# Patient Record
Sex: Female | Born: 1945
Health system: Southern US, Community
[De-identification: ages and names within clinical notes are randomized; demographics above are authoritative.]

## PROBLEM LIST (undated history)

## (undated) DIAGNOSIS — M5136 Other intervertebral disc degeneration, lumbar region: Secondary | ICD-10-CM

## (undated) DIAGNOSIS — E785 Hyperlipidemia, unspecified: Secondary | ICD-10-CM

## (undated) DIAGNOSIS — Z0000 Encounter for general adult medical examination without abnormal findings: Principal | ICD-10-CM

## (undated) DIAGNOSIS — R51 Headache: Secondary | ICD-10-CM

## (undated) DIAGNOSIS — T7840XA Allergy, unspecified, initial encounter: Secondary | ICD-10-CM

## (undated) DIAGNOSIS — J45909 Unspecified asthma, uncomplicated: Secondary | ICD-10-CM

## (undated) DIAGNOSIS — M1990 Unspecified osteoarthritis, unspecified site: Secondary | ICD-10-CM

## (undated) DIAGNOSIS — D649 Anemia, unspecified: Secondary | ICD-10-CM

## (undated) DIAGNOSIS — E669 Obesity, unspecified: Secondary | ICD-10-CM

## (undated) DIAGNOSIS — K219 Gastro-esophageal reflux disease without esophagitis: Secondary | ICD-10-CM

## (undated) DIAGNOSIS — H60399 Other infective otitis externa, unspecified ear: Secondary | ICD-10-CM

## (undated) DIAGNOSIS — O039 Complete or unspecified spontaneous abortion without complication: Secondary | ICD-10-CM

## (undated) DIAGNOSIS — F32A Depression, unspecified: Secondary | ICD-10-CM

## (undated) DIAGNOSIS — Z860100 Personal history of colon polyps, unspecified: Secondary | ICD-10-CM

## (undated) DIAGNOSIS — F329 Major depressive disorder, single episode, unspecified: Secondary | ICD-10-CM

## (undated) DIAGNOSIS — M797 Fibromyalgia: Secondary | ICD-10-CM

## (undated) DIAGNOSIS — G2581 Restless legs syndrome: Secondary | ICD-10-CM

## (undated) DIAGNOSIS — F418 Other specified anxiety disorders: Secondary | ICD-10-CM

## (undated) DIAGNOSIS — Z8719 Personal history of other diseases of the digestive system: Secondary | ICD-10-CM

## (undated) DIAGNOSIS — G3184 Mild cognitive impairment, so stated: Secondary | ICD-10-CM

## (undated) DIAGNOSIS — E079 Disorder of thyroid, unspecified: Secondary | ICD-10-CM

## (undated) DIAGNOSIS — L659 Nonscarring hair loss, unspecified: Secondary | ICD-10-CM

## (undated) DIAGNOSIS — R739 Hyperglycemia, unspecified: Secondary | ICD-10-CM

## (undated) HISTORY — PX: TONSILLECTOMY: SHX5217

## (undated) HISTORY — DX: Hyperglycemia, unspecified: R73.9

## (undated) HISTORY — DX: Major depressive disorder, single episode, unspecified: F32.9

## (undated) HISTORY — DX: Personal history of other diseases of the digestive system: Z87.19

## (undated) HISTORY — DX: Depression, unspecified: F32.A

## (undated) HISTORY — DX: Fibromyalgia: M79.7

## (undated) HISTORY — DX: Obesity, unspecified: E66.9

## (undated) HISTORY — DX: Headache: R51

## (undated) HISTORY — DX: Personal history of colonic polyps: Z86.010

## (undated) HISTORY — DX: Unspecified osteoarthritis, unspecified site: M19.90

## (undated) HISTORY — DX: Complete or unspecified spontaneous abortion without complication: O03.9

## (undated) HISTORY — DX: Gastro-esophageal reflux disease without esophagitis: K21.9

## (undated) HISTORY — PX: CHOLECYSTECTOMY: SHX55

## (undated) HISTORY — PX: ABDOMINAL HYSTERECTOMY: SHX81

## (undated) HISTORY — DX: Disorder of thyroid, unspecified: E07.9

## (undated) HISTORY — DX: Anemia, unspecified: D64.9

## (undated) HISTORY — DX: Other infective otitis externa, unspecified ear: H60.399

## (undated) HISTORY — DX: Other intervertebral disc degeneration, lumbar region: M51.36

## (undated) HISTORY — DX: Other specified anxiety disorders: F41.8

## (undated) HISTORY — DX: Nonscarring hair loss, unspecified: L65.9

## (undated) HISTORY — DX: Mild cognitive impairment, so stated: G31.84

## (undated) HISTORY — PX: BLADDER SUSPENSION: SHX72

## (undated) HISTORY — DX: Restless legs syndrome: G25.81

## (undated) HISTORY — DX: Allergy, unspecified, initial encounter: T78.40XA

## (undated) HISTORY — DX: Personal history of colon polyps, unspecified: Z86.0100

## (undated) HISTORY — DX: Hyperlipidemia, unspecified: E78.5

## (undated) HISTORY — DX: Encounter for general adult medical examination without abnormal findings: Z00.00

---

## 2004-02-05 LAB — CONVERTED CEMR LAB: Pap Smear: NORMAL

## 2009-01-19 ENCOUNTER — Ambulatory Visit: Payer: Self-pay | Admitting: *Deleted

## 2009-01-19 DIAGNOSIS — R51 Headache: Secondary | ICD-10-CM

## 2009-01-19 DIAGNOSIS — Z860100 Personal history of colon polyps, unspecified: Secondary | ICD-10-CM | POA: Insufficient documentation

## 2009-01-19 DIAGNOSIS — R5383 Other fatigue: Secondary | ICD-10-CM

## 2009-01-19 DIAGNOSIS — G894 Chronic pain syndrome: Secondary | ICD-10-CM | POA: Insufficient documentation

## 2009-01-19 DIAGNOSIS — M1990 Unspecified osteoarthritis, unspecified site: Secondary | ICD-10-CM | POA: Insufficient documentation

## 2009-01-19 DIAGNOSIS — R32 Unspecified urinary incontinence: Secondary | ICD-10-CM | POA: Insufficient documentation

## 2009-01-19 DIAGNOSIS — K219 Gastro-esophageal reflux disease without esophagitis: Secondary | ICD-10-CM | POA: Insufficient documentation

## 2009-01-19 DIAGNOSIS — R519 Headache, unspecified: Secondary | ICD-10-CM | POA: Insufficient documentation

## 2009-01-19 DIAGNOSIS — F329 Major depressive disorder, single episode, unspecified: Secondary | ICD-10-CM

## 2009-01-19 DIAGNOSIS — J069 Acute upper respiratory infection, unspecified: Secondary | ICD-10-CM | POA: Insufficient documentation

## 2009-01-19 DIAGNOSIS — F418 Other specified anxiety disorders: Secondary | ICD-10-CM

## 2009-01-19 DIAGNOSIS — IMO0001 Reserved for inherently not codable concepts without codable children: Secondary | ICD-10-CM

## 2009-01-19 DIAGNOSIS — Z8719 Personal history of other diseases of the digestive system: Secondary | ICD-10-CM

## 2009-01-19 DIAGNOSIS — D649 Anemia, unspecified: Secondary | ICD-10-CM

## 2009-01-19 DIAGNOSIS — E039 Hypothyroidism, unspecified: Secondary | ICD-10-CM | POA: Insufficient documentation

## 2009-01-19 DIAGNOSIS — J309 Allergic rhinitis, unspecified: Secondary | ICD-10-CM

## 2009-01-19 DIAGNOSIS — G43009 Migraine without aura, not intractable, without status migrainosus: Secondary | ICD-10-CM | POA: Insufficient documentation

## 2009-01-19 DIAGNOSIS — E785 Hyperlipidemia, unspecified: Secondary | ICD-10-CM | POA: Insufficient documentation

## 2009-01-19 DIAGNOSIS — F411 Generalized anxiety disorder: Secondary | ICD-10-CM | POA: Insufficient documentation

## 2009-01-19 HISTORY — DX: Other specified anxiety disorders: F41.8

## 2009-01-20 ENCOUNTER — Telehealth (INDEPENDENT_AMBULATORY_CARE_PROVIDER_SITE_OTHER): Payer: Self-pay | Admitting: *Deleted

## 2009-01-21 LAB — CONVERTED CEMR LAB
Albumin: 3.6 g/dL (ref 3.5–5.2)
BUN: 13 mg/dL (ref 6–23)
CO2: 27 meq/L (ref 19–32)
Calcium: 9.2 mg/dL (ref 8.4–10.5)
Chloride: 105 meq/L (ref 96–112)
Eosinophils Relative: 1.7 % (ref 0.0–5.0)
GFR calc Af Amer: 109 mL/min
GFR calc non Af Amer: 90 mL/min
Glucose, Bld: 105 mg/dL — ABNORMAL HIGH (ref 70–99)
HCT: 46 % (ref 36.0–46.0)
Lymphocytes Relative: 25 % (ref 12.0–46.0)
Monocytes Relative: 7.4 % (ref 3.0–12.0)
Platelets: 386 10*3/uL (ref 150–400)
Potassium: 4 meq/L (ref 3.5–5.1)
RDW: 12.3 % (ref 11.5–14.6)
Sodium: 142 meq/L (ref 135–145)
TSH: 0.85 microintl units/mL (ref 0.35–5.50)
Total Protein: 7.7 g/dL (ref 6.0–8.3)
WBC: 12 10*3/uL — ABNORMAL HIGH (ref 4.5–10.5)

## 2009-09-01 ENCOUNTER — Ambulatory Visit (HOSPITAL_BASED_OUTPATIENT_CLINIC_OR_DEPARTMENT_OTHER): Admission: RE | Admit: 2009-09-01 | Discharge: 2009-09-01 | Payer: Self-pay | Admitting: Internal Medicine

## 2009-09-01 ENCOUNTER — Ambulatory Visit: Payer: Self-pay | Admitting: Diagnostic Radiology

## 2009-09-01 ENCOUNTER — Ambulatory Visit: Payer: Self-pay | Admitting: Family

## 2009-09-01 DIAGNOSIS — B373 Candidiasis of vulva and vagina: Secondary | ICD-10-CM

## 2009-09-08 ENCOUNTER — Encounter: Payer: Self-pay | Admitting: Family

## 2009-09-09 ENCOUNTER — Encounter: Admission: RE | Admit: 2009-09-09 | Discharge: 2009-09-09 | Payer: Self-pay | Admitting: Internal Medicine

## 2009-09-10 ENCOUNTER — Encounter: Payer: Self-pay | Admitting: Family

## 2009-09-10 LAB — CONVERTED CEMR LAB
BUN: 12 mg/dL (ref 6–23)
Basophils Absolute: 0.1 10*3/uL (ref 0.0–0.1)
Basophils Relative: 1 % (ref 0–1)
Calcium: 9.3 mg/dL (ref 8.4–10.5)
Glucose, Bld: 89 mg/dL (ref 70–99)
Hemoglobin: 13.9 g/dL (ref 12.0–15.0)
MCHC: 32.4 g/dL (ref 30.0–36.0)
Monocytes Absolute: 0.7 10*3/uL (ref 0.1–1.0)
Neutro Abs: 4.3 10*3/uL (ref 1.7–7.7)
Platelets: 347 10*3/uL (ref 150–400)
RDW: 13.2 % (ref 11.5–15.5)
Sodium: 139 meq/L (ref 135–145)
TSH: 0.337 microintl units/mL — ABNORMAL LOW (ref 0.350–4.500)

## 2009-09-11 ENCOUNTER — Encounter: Payer: Self-pay | Admitting: Family

## 2010-02-08 ENCOUNTER — Ambulatory Visit (HOSPITAL_BASED_OUTPATIENT_CLINIC_OR_DEPARTMENT_OTHER): Admission: RE | Admit: 2010-02-08 | Discharge: 2010-02-08 | Payer: Self-pay | Admitting: Internal Medicine

## 2010-02-08 ENCOUNTER — Ambulatory Visit: Payer: Self-pay | Admitting: Diagnostic Radiology

## 2010-02-08 ENCOUNTER — Ambulatory Visit: Payer: Self-pay | Admitting: Family

## 2010-02-08 ENCOUNTER — Telehealth: Payer: Self-pay | Admitting: Family

## 2010-02-08 DIAGNOSIS — R05 Cough: Secondary | ICD-10-CM

## 2010-02-08 DIAGNOSIS — R059 Cough, unspecified: Secondary | ICD-10-CM | POA: Insufficient documentation

## 2010-02-09 ENCOUNTER — Telehealth: Payer: Self-pay | Admitting: Family

## 2010-02-10 ENCOUNTER — Telehealth: Payer: Self-pay | Admitting: Family

## 2010-03-09 ENCOUNTER — Ambulatory Visit: Payer: Self-pay | Admitting: Family

## 2010-03-09 DIAGNOSIS — R400 Somnolence: Secondary | ICD-10-CM | POA: Insufficient documentation

## 2010-03-09 LAB — CONVERTED CEMR LAB
Clue Cells Wet Prep HPF POC: NONE SEEN
Yeast Wet Prep HPF POC: NONE SEEN

## 2010-03-10 ENCOUNTER — Telehealth: Payer: Self-pay | Admitting: Family

## 2010-03-10 ENCOUNTER — Encounter: Payer: Self-pay | Admitting: Family

## 2010-03-18 ENCOUNTER — Telehealth: Payer: Self-pay | Admitting: Family

## 2010-03-25 ENCOUNTER — Encounter: Payer: Self-pay | Admitting: Internal Medicine

## 2010-04-05 ENCOUNTER — Encounter: Payer: Self-pay | Admitting: Family

## 2010-04-06 ENCOUNTER — Telehealth: Payer: Self-pay | Admitting: Family

## 2010-04-20 ENCOUNTER — Encounter: Payer: Self-pay | Admitting: Family

## 2010-04-26 ENCOUNTER — Telehealth: Payer: Self-pay | Admitting: Family

## 2010-05-10 ENCOUNTER — Telehealth: Payer: Self-pay | Admitting: Family

## 2010-08-23 ENCOUNTER — Telehealth: Payer: Self-pay | Admitting: Family

## 2010-08-31 ENCOUNTER — Ambulatory Visit: Payer: Self-pay | Admitting: Family

## 2010-08-31 DIAGNOSIS — M6281 Muscle weakness (generalized): Secondary | ICD-10-CM

## 2010-08-31 LAB — CONVERTED CEMR LAB
Basophils Absolute: 0 10*3/uL (ref 0.0–0.1)
Basophils Relative: 1 % (ref 0–1)
Calcium: 10.2 mg/dL (ref 8.4–10.5)
Creatinine, Ser: 0.92 mg/dL (ref 0.40–1.20)
Eosinophils Relative: 5 % (ref 0–5)
HCT: 43 % (ref 36.0–46.0)
Hemoglobin: 13.9 g/dL (ref 12.0–15.0)
MCHC: 32.3 g/dL (ref 30.0–36.0)
Monocytes Absolute: 0.6 10*3/uL (ref 0.1–1.0)
RDW: 12.5 % (ref 11.5–15.5)
TSH: 0.511 microintl units/mL (ref 0.350–4.500)

## 2010-09-01 LAB — CONVERTED CEMR LAB
Candida species: NEGATIVE
Trichomonal Vaginitis: NEGATIVE

## 2010-09-06 ENCOUNTER — Encounter: Payer: Self-pay | Admitting: Family

## 2010-09-15 ENCOUNTER — Telehealth: Payer: Self-pay | Admitting: Family

## 2010-09-20 ENCOUNTER — Ambulatory Visit: Payer: Self-pay | Admitting: Family

## 2010-09-21 ENCOUNTER — Telehealth: Payer: Self-pay | Admitting: Family

## 2010-09-21 DIAGNOSIS — N760 Acute vaginitis: Secondary | ICD-10-CM | POA: Insufficient documentation

## 2010-09-21 DIAGNOSIS — Z780 Asymptomatic menopausal state: Secondary | ICD-10-CM | POA: Insufficient documentation

## 2010-09-22 ENCOUNTER — Ambulatory Visit: Payer: Self-pay | Admitting: Diagnostic Radiology

## 2010-09-22 ENCOUNTER — Ambulatory Visit (HOSPITAL_BASED_OUTPATIENT_CLINIC_OR_DEPARTMENT_OTHER): Admission: RE | Admit: 2010-09-22 | Discharge: 2010-09-22 | Payer: Self-pay | Admitting: Internal Medicine

## 2010-09-28 ENCOUNTER — Encounter: Payer: Self-pay | Admitting: Family

## 2010-09-28 ENCOUNTER — Telehealth: Payer: Self-pay | Admitting: Family

## 2010-10-01 ENCOUNTER — Encounter: Payer: Self-pay | Admitting: Family

## 2010-10-11 ENCOUNTER — Telehealth: Payer: Self-pay | Admitting: Family

## 2010-12-14 ENCOUNTER — Ambulatory Visit
Admission: RE | Admit: 2010-12-14 | Discharge: 2010-12-14 | Payer: Self-pay | Source: Home / Self Care | Attending: Family | Admitting: Family

## 2010-12-14 NOTE — Letter (Signed)
   [facility] [address] [place], [place]  [address]  [place] Phone: [phone]      September 06, 2010   [facility] [address] [place], [place] [address]  RE:  LAB RESULTS  Dear  Ms. Barham,  The following is an interpretation of your most recent lab tests.  Please take note of any instructions provided or changes to medications that have resulted from your lab work.  ELECTROLYTES:  Good - no changes needed  KIDNEY FUNCTION TESTS:  Good - no changes needed  THYROID STUDIES:  Thyroid studies normal TSH: 0.511     DIABETIC STUDIES:  Excellent - no changes needed Blood Glucose: 87    CBC:  Good - no changes needed   Sincerely Yours,    Lemont Fillers FNP  Appended Document:  Mailed and given verbally.

## 2010-12-14 NOTE — Progress Notes (Signed)
Summary: result, tsh order  Phone Note Outgoing Call   Summary of Call: Please notify patient that her wet prep does not show yeast or bacteria.  If her symptoms persist I would like to send her to GYN.  Also, I reviewed note from last visit.  Per our discussion last visit- I will be referring her to psychiatry for management of her psychiatric meds, this is especially important in the setting of her sleepiness.  It looks like she refuse psych referral last visit.  Per our earlier discussion I will not be managing her psychiatric meds- she needs to see psychiatry and I will send referral again to [facility].  It is important that she keep this visit.  We will check on pain management referral as well.  Patient should return for Fasting TSH 244.9 in the next 1-2 weeks. Initial call taken by: Lemont Fillers FNP,  March 10, 2010 10:29 AM  Follow-up for Phone Call        Left message on machine to return my call.  Mervin Kung CMA  March 10, 2010 10:42 AM      Appended Document: result, tsh order Advised pt of Kayla [name]'s instructions.  Pt. was very frustrated that she was going to need to be referred out to other Providers and we could not treat her for everything. I explained to pt. it was for her best interest to see specialists to receive the best care she needs. Pt states she will come by next week for fasting TSH.

## 2010-12-14 NOTE — Progress Notes (Signed)
Summary: refill--Topiramate  Phone Note Refill Request Message from:  Fax from Pharmacy on August 23, 2010 9:19 AM  Refills Requested: Medication #1:  TOPAMAX 25 MG TABS one tablet by mouth two times a day.   Dosage confirmed as above?Dosage Confirmed   Supply Requested: 1 month   Last Refilled: 06/13/2010 When does pt need to f/u with you?  Next Appointment Scheduled: none Initial call taken by: Mervin Kung CMA Duncan Dull),  August 23, 2010 9:20 AM  Follow-up for Phone Call        Patient is overdue for follow up.  Can give her a 2 week supply only.  She needs to schedule a follow up appointment. Follow-up by: Lemont Fillers FNP,  August 23, 2010 1:17 PM  Additional Follow-up for Phone Call Additional follow up Details #1::        Refill sent to pharmacy for #20 x no refills. Attempted to contact pt. Left message on machine to return my call. Nicki Guadalajara Fergerson CMA Duncan Dull)  August 23, 2010 4:11 PM     Additional Follow-up for Phone Call Additional follow up Details #2::    Left message on machine to return my call. Nicki Guadalajara Fergerson CMA Duncan Dull)  August 24, 2010 8:21 AM   Additional Follow-up for Phone Call Additional follow up Details #3:: Details for Additional Follow-up Action Taken: Pt returned my call and scheduled f/u with Melissa for 08/30/10 @ 2:15pm. Mervin Kung CMA (AAMA)  August 24, 2010 1:38 PM   New/Updated Medications: TOPIRAMATE 25 MG TABS (TOPIRAMATE) Take 1-2 tablets once a day. Prescriptions: TOPIRAMATE 25 MG TABS (TOPIRAMATE) Take 1-2 tablets once a day.  #20 x 0   Entered by:   Mervin Kung CMA (AAMA)   Authorized by:   Lemont Fillers FNP   Signed by:   Mervin Kung CMA (AAMA) on 08/23/2010   Method used:   [facility]  [address] [phone]* (retail[facility]       [address]       , [place]  [address]       Ph: [phone] or [phone]       Fax: [phone]   RxID:    [id number]

## 2010-12-14 NOTE — Progress Notes (Signed)
Summary: REFERRAL [facility] ADDITIONAL RECORDS, letter mailed  Phone Note From Other Clinic Call back at [phone]   Caller: [facility]  Call For: YOO Summary of Call: DR Servando Salina REVIEWED NOTES SENT TO HIM WITH REFERRAL HE WANTS [place] TO CONTACT THE PATIENT AND ADVISE HER TO COME SIGN A RELEASE TO GET RECORDS FROM DR Iran Sizer IN [place] AT [facility]   HE ALSO WANTS RECORDS FROM [facility]  Initial call taken by: Roselle Locus,  Mar 18, 2010 8:51 AM  Follow-up for Phone Call        L/M on machine for pt to return call   Left message on machine for pt to return call  addition  information needed before appt scheduled  with pain clinic        Darral Dash  Mar 30, 2010 11:40 AM  L/M on machine for pt to return call   Darral Dash  Apr 02, 2010 9:54 AM   Follow-up by: Darral Dash,  Mar 18, 2010 4:07 PM  Additional Follow-up for Phone Call Additional follow up Details #1::        Nicki Guadalajara- could you pls send a letter to Ms Kelby Fam requesting that she return to fill medical release form so that we may process pain clinic referral?  Thanks Additional Follow-up by: Lemont Fillers FNP,  Apr 05, 2010 1:22 PM    Additional Follow-up for Phone Call Additional follow up Details #2::    Letter mailed to pt to stop by and sign records releases. Mervin Kung CMA  Apr 05, 2010 1:51 PM

## 2010-12-14 NOTE — Progress Notes (Signed)
Summary: Kayla [name] refill  Phone Note Outgoing Call   Summary of Call: Please call patient and confirm that she has made appointment with psychiatry.  Please remind her that I will only refill her psychiatric meds if she has a scheduled apt with psychiatry and then will defer to psychiatry thereafter. Initial call taken by: Lemont Fillers FNP,  April 26, 2010 11:12 AM  Follow-up for Phone Call        Spoke to pt. today and she states she needs her Ambien refilled. She has left 3 messages with Dr. Daphine Deutscher office and no one has called her back to schedule appt.  She states she will move to the next doctor on her list to contact but in the meantime she needs a refill on Ambien.  Please advise.  Mervin Kung CMA  April 27, 2010 1:54 PM   Additional Follow-up for Phone Call Additional follow up Details #1::        ok to give 30 tablets of ambien no refills.  No refills of lorazepam or alprazolam until she has a confirmed appointment with psychiatry. Additional Follow-up by: Lemont Fillers FNP,  April 27, 2010 3:31 PM    Additional Follow-up for Phone Call Additional follow up Details #2::    Ambien refilled per Tsuruko Murtha's instructions.  Lleft message on machine to return my call. Mervin Kung CMA  April 27, 2010 4:11 PM   Pt returned my call and was notified of instructions.  Pt. voices understanding.  Mervin Kung CMA  April 27, 2010 5:15 PM   Prescriptions: AMBIEN CR 12.5 MG CR-TABS (ZOLPIDEM TARTRATE) as needed  #30 x 0   Entered by:   Mervin Kung CMA   Authorized by:   Lemont Fillers FNP   Signed by:   Mervin Kung CMA on 04/27/2010   Method used:   Telephoned to ...       [facility]  [address] [phone]* (retail)       [address]       [place], [place]  [address]       Ph: [phone] or [phone]       Fax: [phone]   RxID:   [id number]

## 2010-12-14 NOTE — Letter (Signed)
Summary: Generic Letter  [facility]  [address]   [place], [place] [address]   Phone: [phone]  Fax: [phone]      04/05/2010    [facility] [address] [place], [place]  [address]    Dear Ms. Coulibaly,  We have been unable to reach you by phone and want to let you know that your referral to [facility] is pending as they need additional information to process the referral.   At present, they are requesting records from Dr. Elvin So in [place] and records from [facility] in order to complete your referral to [facility]. Please stop by our office at your earliest convenience to sign these releases so we may process your referral.   Our office is open from 8am to 5pm Monday through Friday.  Sincerely,    Sandford Craze, NP

## 2010-12-14 NOTE — Progress Notes (Signed)
Summary: refill--Estradiol  Phone Note Refill Request Message from:  Patient on October 11, 2010 2:07 PM  Refills Requested: Medication #1:  ESTRADIOL 1 MG TABS one tablet by mouth once daily   Dosage confirmed as above?Dosage Confirmed   Supply Requested: 1 month   Last Refilled: 08/31/2010 Received call from [facility] stating [facility] Estradiol. Melissa, I did deny refill because refill history showed we sent rx on 08/31/10 #30 x 1 refill. Pt should still have 1 refill on file. Verified with Vernona Rieger, pharmacist that pt still has rf on file and they will fill it. Do we need to continue filling script after this as you had noted no refills after 60 days. Please advise.   Follow-up for Phone Call        After the 60 days on 1mg  of estradiol, I would like her to discontinue the estradiol please.  No further refills.  Thanks Follow-up by: Lemont Fillers FNP,  October 11, 2010 2:20 PM  Additional Follow-up for Phone Call Additional follow up Details #1::        Advised pt per [facility] instruction. Pt was concerned about stopping the Estradiol. Advised pt per Efraim Kaufmann that it is time to stop it.  We have had her on a reduced strength for 2 months. Pt states that her current bottle says 2mg . I verified with Vernona Rieger, pharmacist that they filled 1mg  last month. I advised pt to take bottle to pharmacy to verify what she has and have the pharmacy call me if it is any different than previously stated. Pt voices understanding. Nicki Guadalajara Fergerson CMA Duncan Dull)  October 11, 2010 3:34 PM

## 2010-12-14 NOTE — Progress Notes (Signed)
Summary: nystatin request, head pain  Phone Note Outgoing Call   Call placed by: Mervin Kung, CMA (AAMA) Call placed to: Patient Summary of Call: Left message for pt to call.  Is pt requesting refill on Nystatin or is pharmacy sending an auto request? Does pt still have symptoms? Initial call taken by: Mervin Kung CMA Duncan Dull),  September 15, 2010 3:51 PM  Follow-up for Phone Call        Pt states Nystatin cream has cut her itching down substantially but not taken it away completely. Wants refill sent to pharmacy.  States that a doctor in the past prescribed Nystatin in a powder form that is taken sublingually and wants to know if it can be called in as a powder form? Still having intermittent pain over eye from when she fell 3 weeks ago. Pt states she has an awareness that the area feels head achey and wants to know if it is normal to still have this sensation?  Nicki Guadalajara Fergerson CMA Duncan Dull)  September 16, 2010 2:54 PM   Additional Follow-up for Phone Call Additional follow up Details #1::        She should be evaluated in the office please. If she develops severe headache, nausea/vomitting prior to her visit, she should go to the ED. Additional Follow-up by: Lemont Fillers FNP,  September 16, 2010 3:00 PM    Additional Follow-up for Phone Call Additional follow up Details #2::    Pt advised per Melissa's instructions and scheduled f/u for 09/20/10 @ 1:30pm with Melissa.  Pt voice understanding. Nicki Guadalajara Fergerson CMA Duncan Dull)  September 16, 2010 5:01 PM

## 2010-12-14 NOTE — Progress Notes (Signed)
Summary: medication follow up, MRI  Phone Note Outgoing Call   Call placed by: Lemont Fillers FNP,  September 21, 2010 10:09 AM Call placed to: Specialist Summary of Call: Spoke with Corrie Dandy,  RN at Dr. Imagene Gurney office (psychiatry) and alerted her that patient has been having falls (taking 3 tabs of clonazepam at bedtime along with 2 soma and Mirtazapine.)  She tells me that dosing for clonazepam 2 mg is 1/2 tab twice during the day and 2 tabs at bedtime.  She will discuss with Dr. Sandria Manly. Also, called patient's pharmacy and they report that the last person who filled her soma was Dr. Jeanella Craze.  She has not been back to pain management since june.   Initial call taken by: Lemont Fillers FNP,  September 21, 2010 10:11 AM  Follow-up for Phone Call        Please call patient and let her know that she should discontinue soma and not take more that two tablets of clonazepam at bedtime.  I discussed dosing with psychiatry.  Also, if she is still taking lunesta or vistaril, these medications need to be stopped as well. We don't want her to have any more falls.   Follow-up by: Lemont Fillers FNP,  September 21, 2010 10:13 AM  Additional Follow-up for Phone Call Additional follow up Details #1::        Left message on machine to return my call. Nicki Guadalajara Fergerson CMA Duncan Dull)  September 21, 2010 11:16 AM     Additional Follow-up for Phone Call Additional follow up Details #2::    Pt left voice message stating insurance will not cover MRI. Pt called to cancel MRI for tomorrow. Please advise. Nicki Guadalajara Fergerson CMA Duncan Dull)  September 21, 2010 1:26 PM    patient returned call and  and can be reaced at  [phone] Glendell Docker CMA  September 21, 2010 1:48 PM   Notified pt per [facility] recommendation. Pt stated she has not been taking Lunesta or Visatril. Pt is not happy but voices understanding.  Pt stated that she did not feel she could afford the MRI unless Melissa thought it was really necessary. I  advised pt that it is Melissa's recommendation that she proceed with MRI to evaluate her symptom. Pt states she will need an appt. after 12pm. She is aware that Myriam Jacobson will call her with new appt time. Nicki Guadalajara Fergerson CMA Duncan Dull)  September 21, 2010 2:15 PM   New/Updated Medications: CLONAZEPAM 2 MG TABS (CLONAZEPAM) 1/2 tab by mouth in the AM and in afternoon,  2 tabs by mouth at bedtime

## 2010-12-14 NOTE — Progress Notes (Signed)
Summary: records rec from Dr Sandria Manly  Phone Note Other Incoming   Caller: records rec from Dr Sandria Manly  Summary of Call: records rec from Dr Sandria Manly Initial call taken by: Roselle Locus,  September 28, 2010 9:13 AM

## 2010-12-14 NOTE — Progress Notes (Signed)
Summary: refill request--lorazepam  Phone Note Refill Request Message from:  Patient on Apr 06, 2010 4:49 PM  Refills Requested: Medication #1:  LORAZEPAM 2 MG TABS one tab by mouth twice daily as needed.   Supply Requested: 1 month   Notes: Pt requests refill until she can get in with Pain management group. States appt is set for 04/15/10 @2 :30pm with [facility]. [facility] [address] Please let pt know when rx is completed. Mervin Kung CMA  Apr 06, 2010 5:02 PM    Follow-up for Phone Call        Called patient back.  Reviewed our discussion in regards to psychiatric referral.  Patient did not arrange psychiatric appointment as she told us she would.  She tells me that she was under the impression that pain management would be managing her psychiatric meds.  I told her that this is not the case.  Told patient that I would give her 1 month refill only on her psychiatric meds and that she is to keep appointment with psychiatry that is made for her. After this appointment further refills will need to be filled by psychiatry.  Patient verbalizes understanding. Follow-up by: Lemont Fillers FNP,  Apr 06, 2010 5:11 PM  Additional Follow-up for Phone Call Additional follow up Details #1::        OK to give lorazepam 2mg  #60 with no refills. Additional Follow-up by: Lemont Fillers FNP,  Apr 07, 2010 10:10 AM    Additional Follow-up for Phone Call Additional follow up Details #2::    Refill left on pharmacy voicemail.  Mervin Kung CMA  Apr 07, 2010 10:40 AM

## 2010-12-14 NOTE — Assessment & Plan Note (Signed)
Summary: feminine itching/dt   Vital Signs:  Patient profile:   65 year old female Height:      67 inches Weight:      219.25 pounds BMI:     34.46 Temp:     98.1 degrees F oral Pulse rate:   72 / minute Pulse rhythm:   regular Resp:     16 per minute BP sitting:   140 / 78  (right arm) Cuff size:   large  Vitals Entered By: Mervin Kung CMA (March 09, 2010 3:32 PM) CC: room 4  Pt states vaginal itching never goes away.  Comments Pt would like letter  to the [employer] stating she is unable to walk distances and needs a mailbox placed by her door. Pt also requests prescription for Provigil or Nuvigil? to help her stay awake when she drives long distance trips.   Primary Care Provider:  Paulo Fruit MD  CC:  room 4  Pt states vaginal itching never goes away. .  History of Present Illness: [name] is a 65 year old female who presents today with c/o vaginal itching.  This has been going on for "as long as I can remember." Denies vaginal discharge.  Itching is most severe on the Labia.  She has tried vagisil with temporary improvement.  Patient also comlains of difficulty sleeping and daytime somnolence.  She is requesting Provigil.    Allergies: 1)  ! Phenergan 2)  ! Pcn  Physical Exam  General:  Well-developed,well-nourished,in no acute distress; alert,appropriate and cooperative throughout examination Abdomen:  Bowel sounds positive,abdomen soft and non-tender without masses, organomegaly or hernias noted. Genitalia:  Labia normal without lesions, uterus is surgically absent.  No adnexal fullness on bimanual exam.     Impression & Recommendations:  Problem # 1:  CANDIDIASIS, VAGINAL (ICD-112.1) Assessment New Wet prep sent to lab to confirm.  In meantime will plan to treat with fluconozole.   Her updated medication list for this problem includes:    Fluconazole 150 Mg Tabs (Fluconazole) ..... One tablet by mouth today, may repeat in 3 days as  needed  Orders: T-Wet Prep by [facility] [phone])  Problem # 2:  HYPOTHYROIDISM (ICD-244.9) Assessment: Comment Only  Patient is unsure of dose of armour thyroid.  Pt will call us with the dose.  Due for follow up TSH  Her updated medication list for this problem includes:    Armour Thyroid 60 Mg Tabs (Thyroid) ..... One tablet by mouth daily  Labs Reviewed: TSH: 0.337 (09/10/2009)     Problem # 3:  SOMNOLENCE (ICD-780.09) Assessment: Unchanged  Pt notes + hx of OSA diagnosed 15 years ago.  I suspect that her polypharmacy along with OSA is contributing to her somnolence.  I told pt that we should consider weaning back on her meds and performing a sleep study rather than adding provigil to mask the issue.  She is agreeable to sleep study but wants to find out if her insurance will cover this.  I also advised patient not to drive if she is feeling drowsy.    Orders: Sleep Disorder Referral (Sleep Disorder)  Problem # 4:  CHRONIC PAIN SYNDROME (ICD-338.4) Assessment: Unchanged  Will follow up on pain management referral.  Patient tells me that she was never contacted with apt.    Problem # 5:  ANXIETY (ICD-300.00) Assessment: Unchanged  Reviewed old notes.  Patient refused referral to psychiatry.  Given polypharmacy and somnolence I would like these meds to be  managed by psychiatry.  She continues to have insomnia which may be due to depression/anxiety as well as OSA.  Will notify patient again  that her meds should be managed by psychiatry.  Her updated medication list for this problem includes:    Cymbalta 60 Mg Cpep (Duloxetine hcl) .Marland Kitchen... Take 1 tablet by mouth once a day    Alprazolam 0.5 Mg Tabs (Alprazolam) ..... One tablet 4 to 6 times daily as needed    Lorazepam 2 Mg Tabs (Lorazepam) ..... One tab by mouth twice daily as needed.  Orders: Psychiatric Referral (Psych)  Complete Medication List: 1)  Gabapentin 300 Mg Caps (Gabapentin) .... One tab 2 to 4 times a  day. 2)  Cymbalta 60 Mg Cpep (Duloxetine hcl) .... Take 1 tablet by mouth once a day 3)  Estradiol 2 Mg Tabs (Estradiol) .... Take 1 tablet by mouth once a day 4)  Dilaudid 4 Mg Tabs (Hydromorphone hcl) .... One tablet daily 5)  Ambien Cr 12.5 Mg Cr-tabs (Zolpidem tartrate) .... As needed 6)  Alprazolam 0.5 Mg Tabs (Alprazolam) .... One tablet 4 to 6 times daily as needed 7)  Lorazepam 2 Mg Tabs (Lorazepam) .... One tab by mouth twice daily as needed. 8)  Soma 250 Mg Tabs (Carisoprodol) .... Take 1 tablet by mouth two times a day as needed 9)  Topiramate Tabs (topiramate)  .... Take one tablet 1 - 2 times a day. 10)  Fluconazole 150 Mg Tabs (Fluconazole) .... One tab by mouth x 1 11)  Armour Thyroid 60 Mg Tabs (Thyroid) .... One tablet by mouth daily 12)  Fluconazole 150 Mg Tabs (Fluconazole) .... One tablet by mouth today, may repeat in 3 days as needed  Patient Instructions: 1)  Please call if your symptoms worsen or do not improve.   2)  We will call you with the results of your vaginal swab.   Prescriptions: FLUCONAZOLE 150 MG TABS (FLUCONAZOLE) one tablet by mouth today, may repeat in 3 days as needed  #2 x 1   Entered and Authorized by:   Lemont Fillers FNP   Signed by:   Lemont Fillers FNP on 03/09/2010   Method used:   [facility]  [address] [phone]* (retail)       [address]       [place], [place]  [address]       Ph: [phone] or [phone]       Fax: [phone]   RxID:   [id number]   Current Allergies (reviewed today): ! PHENERGAN ! PCN

## 2010-12-14 NOTE — Letter (Signed)
Summary: Records Dated 05-06-10 thru 09-02-10/[facility]  Records Dated 05-06-10 thru 09-02-10/[facility]   Imported By: Lanelle Bal 10/13/2010 12:09:56  _____________________________________________________________________  External Attachment:    Type:   Image     Comment:   External Document

## 2010-12-14 NOTE — Letter (Signed)
Summary: Patient Will Call Back/[facility]  Patient Will Call Back/[facility]   Imported By: Lanelle Bal 03/31/2010 13:11:56  _____________________________________________________________________  External Attachment:    Type:   Image     Comment:   External Document

## 2010-12-14 NOTE — Progress Notes (Signed)
Summary: postal letter  Phone Note Call from Patient   Caller: Patient Call For: Lemont Fillers FNP Summary of Call: Pt requests letter to the postal service be mailed to her home address per 03/09/10 office visit note. Please advise when complete.  Mervin Kung CMA  March 10, 2010 2:54 PM   Follow-up for Phone Call        done. Follow-up by: Lemont Fillers FNP,  March 10, 2010 3:13 PM     Appended Document: postal letter Notified pt. that letter will go out in the mail tomorrow.

## 2010-12-14 NOTE — Progress Notes (Signed)
Summary: handicap placard  Phone Note Outgoing Call   Summary of Call: Left message on machine to return call.  Need to ask pt. if we gave her a completed handicap placard at her visit yesterday?  Mervin Kung CMA  February 09, 2010 4:09 PM   Follow-up for Phone Call        Pt. states she had her application signed at her last visit.  She states that her address has changed and is different than the address written on her application. She will check with the [facility] office and if it needs correction by [place] she will call and let me know.  Mervin Kung CMA  February 10, 2010 8:48 AM

## 2010-12-14 NOTE — Progress Notes (Signed)
Summary: needs labs--lm  Phone Note Outgoing Call   Summary of Call: Pls call patient and let her know that I reviewed her thyroid studies from back in October.  We should repeat some thyroid labs some time in the next week when it is convenient for her (TSH, free T3,free T4 244.9) Initial call taken by: Lemont Fillers FNP,  February 08, 2010 5:41 PM  Follow-up for Phone Call        Left message on machine to return call.  Mervin Kung CMA  February 09, 2010 8:39 AM   Additional Follow-up for Phone Call Additional follow up Details #1::        Notified pt. of need to return for additional thyroid tests. Order put into system for [facility]. Pt. will go by tomorrow. Additional Follow-up by: Mervin Kung CMA,  February 09, 2010 10:50 AM

## 2010-12-14 NOTE — Assessment & Plan Note (Signed)
Summary: f/u after fall / tf,cma   Vital Signs:  Patient profile:   65 year old female Height:      67 inches Weight:      228.25 pounds BMI:     35.88 O2 Sat:      95 % on Room air Temp:     98.0 degrees F oral Pulse rate:   106 / minute Pulse rhythm:   regular Resp:     24 per minute BP sitting:   122 / 90  (right arm) Cuff size:   large  Vitals Entered By: Glendell Docker CMA (September 20, 2010 1:38 PM)  O2 Flow:  Room air CC: follow-up visit Is Patient Diabetic? No Pain Assessment Patient in pain? no      Comments headache still present on side that she has had a fall, unresolved vaginal irritation, questions about nystatin powder,    Primary Care Provider:  Lemont Fillers FNP  CC:  follow-up visit.  History of Present Illness: Ms Laughery presents today for follow up.  1)Falls-  patient notes that she has had a total of 4 falls.  The falls started after Dr. Sandria Manly started after she started clonazepam/mirtazapine.  She had trouble getting up after these falls.  Now feels that she is more used to the drug- doesn't feel that it affects her at all.  Notes daytime somnolence, but tells me that she is unable to afford a sleep study.  She reports that she is taking 3 clonazepam, 2 Somas and a mirtazapine every night.  "That is the only way that I can fall asleep."   2) Head injury- 10/7-  fall, notes HA on the right side of her Head.  She does note that she has headache is 2/10.  Throbbing.  No alleviating or aggravating factors.  Blurred vision which started several weeks before her head injury.  3) Vaginal itching- external- improving,  but not gone, felt that the nystatin cream helped.  Preventive Screening-Counseling & Management  Alcohol-Tobacco     Smoking Status: never  Allergies: 1)  ! Phenergan 2)  ! Pcn  Past History:  Past Medical History: Last updated: 01/19/2009 Allergic rhinitis Anemia-NOS Colonic polyps, hx of Depression Diverticulitis, hx  of GERD Headache Hyperlipidemia Hypothyroidism Osteoarthritis Urinary incontinence migraine fibromyalgia  Past Surgical History: Last updated: 01/19/2009 Cholecystectomy Hysterectomy Tonsillectomy miscarriage  Social History: Reviewed history from 01/19/2009 and no changes required. unemployed Never Smoked Alcohol use-yes  Physical Exam  General:  Chronically ill appearing white female, awake, alert,and in NAD Head:  mild erythema noted on right side of forehead without swelling. Lungs:  Normal respiratory effort, chest expands symmetrically. Lungs are clear to auscultation, no crackles or wheezes. Heart:  Normal rate and regular rhythm. S1 and S2 normal without gallop, murmur, click, rub or other extra sounds. Neurologic:  No cranial nerve deficits noted. Station and gait are normal. Plantar reflexes are down-going bilaterally. DTRs are diminished in LE's. Sensory, motor and coordinative functions appear intact- however she is noted to have generalized UE/LE weakness 4-5/5 strength.    Impression & Recommendations:  Problem # 1:  ACCIDENTAL FALLS, RECURRENT (ICD-E888.9) Assessment Comment Only  I suspect that her falls are due to her polypharmacy and generalized deconditioning.  I also suspect that her daytime somnolence is due to her polypharmacy. I do not believe that she is taking her benzo's as prescribed.  I have requested her records from Dr. Sandria Manly psychiatrist to see what he is actually prescribing.  I will also request the records from her pain management group to see if she is following regularly with them.    Orders: Misc. Referral (Misc. Ref)  Problem # 2:  HEADACHE (ICD-784.0) Assessment: Unchanged  I have recommended that the patient complete an MRI of the brain to rule out CVA or SDH.   She is uncertain that she can afford to complete this test.  I will have our referral coordinator call her insurer for prior authorization.   Orders: Misc. Referral (Misc.  Ref)  Problem # 3:  CANDIDIASIS, VAGINAL (ICD-112.1) Assessment: Comment Only  Wet prep was normal, still having some external irritation which was improved with nystatin, refill was sent to her pharmacy last week.  The following medications were removed from the medication list:     Her updated medication list for this problem includes:    Nystatin 100000 Unit/gm Oint (Nystatin) .Marland Kitchen... Apply twice daily to affected area for 1 week  The following medications were removed from the medication list:    Fluconazole 150 Mg Tabs (Fluconazole) ..... One tab by mouth x 1    Fluconazole 150 Mg Tabs (Fluconazole) ..... One tablet by mouth today, may repeat in 3 days as needed Her updated medication list for this problem includes:    Nystatin 100000 Unit/gm Oint (Nystatin) .Marland Kitchen... Apply twice daily to affected area for 1 week  Complete Medication List: 1)  Gabapentin 300 Mg Caps (Gabapentin) .... One tab 2 to 4 times a day. 2)  Cymbalta 60 Mg Cpep (Duloxetine hcl) .... Take 1 tablet by mouth once a day 3)  Estradiol 1 Mg Tabs (Estradiol) .... One tablet by mouth once daily 4)  Soma 250 Mg Tabs (Carisoprodol) .... Take 1 tablet by mouth two times a day as needed 5)  Armour Thyroid 60 Mg Tabs (Thyroid) .... One tablet by mouth daily 6)  Topamax 25 Mg Tabs (Topiramate) .... One tablet by mouth two times a day 7)  Nystatin 100000 Unit/gm Oint (Nystatin) .... Apply twice daily to affected area for 1 week 8)  Clonazepam 2 Mg Tabs (Clonazepam) .... 1/2 tab by mouth in the am and 2.5 tab by mouth in the pm 9)  Mirtazapine 15 Mg Tbdp (Mirtazapine) .... One tablet by mouth at bedtime  Patient Instructions: 1)  We will contact you about scheduling your MRI. 2)  Please follow up in 1 month. 3)  Go to the ED if you have a recurrent fall, numbness, weakness, or slurred speech, worsenin HA.    Orders Added: 1)  Est. Patient Level III [09811] 2)  Misc. Referral [Misc. Ref]    Current Allergies (reviewed  today): ! PHENERGAN ! PCN

## 2010-12-14 NOTE — Assessment & Plan Note (Signed)
Summary: f/u / tf,cma pt rsch, couldn't find ride/dt--rm 4   Vital Signs:  Patient profile:   65 year old female Height:      67 inches Temp:     98.5 degrees F oral Pulse rate:   84 / minute Pulse rhythm:   regular Resp:     16 per minute BP sitting:   120 / 80  (right arm) Cuff size:   large  Vitals Entered By: Mervin Kung CMA Duncan Dull) (August 31, 2010 3:32 PM) CC: Rm 4  Follow up.   Pt declines weight. Is Patient Diabetic? No Comments  Pt states she still has vaginal itching and burning not helped  by OTC treatments or Fluconazole.  Has had trouble getting up from sitting positions x 4 months. Recently fell and hit right side of forehead and is still sore and having headache. Pt no longer takes Dilaudid, Ambien Alprazolam or Lorazepam. Will call pharmacy for new meds. Nicki Guadalajara Fergerson CMA Duncan Dull)  August 31, 2010 3:47 PM  [facility] at [facility] pt is taking : Vistaril 25mg  1-2 at bedtime as needed sleep.  Lunesta 3mg  1 at bedtime, clonazepam 2mg  1/2 during the day and 1/2 - 2 at bedtime. Mervin Kung CMA Duncan Dull)  August 31, 2010 3:59 PM    Primary Care Wiley Magan:  Paulo Fruit MD  CC:  Rm 4  Follow up.   Pt declines weight..  History of Present Illness: Feels ugly and fat since her husband left her after 38 yrs.  This happened in 2/05.    Cymbalta- taking twice a day for fibromyalgia and depression.    Notes + vaginal "burning."  Patient is currently on estradiol for almost 20 years.  complains of vaginal burning.    Allergies: 1)  ! Phenergan 2)  ! Pcn   Impression & Recommendations:  Problem # 1:  MUSCLE WEAKNESS (GENERALIZED) (ICD-728.87)  Complete Medication List: 1)  Gabapentin 300 Mg Caps (Gabapentin) .... One tab 2 to 4 times a day. 2)  Cymbalta 60 Mg Cpep (Duloxetine hcl) .... Take 1 tablet by mouth once a day 3)  Estradiol 1 Mg Tabs (Estradiol) .... One tablet by mouth once daily 4)  Soma 250 Mg Tabs (Carisoprodol) .... Take 1 tablet by mouth two times  a day as needed 5)  Topiramate 25 Mg Tabs (Topiramate) .... Take 1-2 tablets once a day. 6)  Fluconazole 150 Mg Tabs (Fluconazole) .... One tab by mouth x 1 7)  Armour Thyroid 60 Mg Tabs (Thyroid) .... One tablet by mouth daily 8)  Fluconazole 150 Mg Tabs (Fluconazole) .... One tablet by mouth today, may repeat in 3 days as needed 9)  Topamax 25 Mg Tabs (Topiramate) .... One tablet by mouth two times a day 10)  Nystatin 100000 Unit/gm Oint (Nystatin) .... Apply twice daily to affected area for 1 week 11)  Clonazepam 2 Mg Tabs (Clonazepam) .... 1/2 tab by mouth in the am and 1/2-1 tab by mouth in the pm 12)  Vistaril 25 Mg Caps (Hydroxyzine pamoate) .Marland Kitchen.. 1-2 tabs by mouth at bedtime as needed for sleep  Other Orders: TLB-BMP (Basic Metabolic Panel-BMET) (80048-METABOL) TLB-CBC Platelet - w/Differential (85025-CBCD) TLB-TSH (Thyroid Stimulating Hormone) (84443-TSH) T-Wet Prep by Molecular Probe [phone]) Specimen Handling (51884) Admin 1st Vaccine (16606) Flu Vaccine 78yrs + (30160)  Patient Instructions: 1)  I have decreased your estradiol and sent this prescription to your pharmacy. 2)  Complete labs downstairs today. 3)  Try to exercise daily. 4)  Please  follow up in 1 month. Prescriptions: NYSTATIN 100000 UNIT/GM OINT (NYSTATIN) apply twice daily to affected area for 1 week  #1 x 0   Entered and Authorized by:   Lemont Fillers FNP   Signed by:   Lemont Fillers FNP on 09/01/2010   Method used:   [facility]  [address] [phone][facility] (retail[facility]       [address]       [place], [place]  [address]       Ph: [phone] or [phone]       Fax: [phone]   RxID:   [id number] ESTRADIOL 1 MG TABS (ESTRADIOL) one tablet by mouth once daily  #30 x 1   Entered and Authorized by:   Lemont Fillers FNP   Signed by:   Lemont Fillers FNP on 08/31/2010   Method used:   [facility]        [facility]  [address] [phone][facility]  ([facility]ail)       [address]       [place], [place]  [address]       Ph: [phone] or [phone]       Fax: [phone]   RxID:   [id number]    Orders Added: 1)  TLB-BMP (Basic Metabolic Panel-BMET) [80048-METABOL] 2)  TLB-CBC Platelet - w/Differential [85025-CBCD] 3)  TLB-TSH (Thyroid Stimulating Hormone) [84443-TSH] 4)  T-Wet Prep by Molecular Probe [[id number]] 5)  Specimen Handling [99000] 6)  Admin 1st Vaccine [90471] 7)  Flu Vaccine 24yrs + [83151]    Current Allergies (reviewed today): ! PHENERGAN ! PCN  Flu Vaccine Consent Questions     Do you have a history of severe allergic reactions to this vaccine? no    Any prior history of allergic reactions to egg and/or gelatin? no    Do you have a sensitivity to the preservative Thimersol? no    Do you have a past history of Guillan-Barre Syndrome? no    Do you currently have an acute febrile illness? no    Have you ever had a severe reaction to latex? no    Vaccine information given and explained to patient? yes    Are you currently pregnant? no    Lot Number:[id number]   Exp Date:05/13/2010   Site Given  Right Deltoid IM.  Nicki Guadalajara Fergerson CMA Duncan Dull)  August 31, 2010 4:58 PM  [place] [address] [place], [place]  [address]  [place] Phone: [phone]      September 01, 2010   [facility] [address] Ronnette Hila [place], [place] [address]  RE:  LAB RESULTS  Dear  Ms. Schatzman,  The following is an interpretation of your most recent lab tests.  Please take note of any instructions provided or changes to medications that have resulted from your lab work.  ELECTROLYTES:  Good - no changes needed  KIDNEY FUNCTION TESTS:  Good - no changes needed  THYROID STUDIES:  Thyroid studies normal TSH: 0.337    Also,  your vaginal swab results were normal.  Please apply the cream twice daily for the next 1-2 weeks.    Please call to schedule a follow up appointment in 1  month.    Medications Prescribed or Changed ESTRADIOL 1 MG TABS (ESTRADIOL) one tablet by mouth once daily NYSTATIN 100000 UNIT/GM OINT (  NYSTATIN) apply twice daily to affected area for 1 week CLONAZEPAM 2 MG TABS (CLONAZEPAM) 1/2 tab by mouth in the AM and 1/2-1 tab by mouth in the PM VISTARIL 25 MG CAPS (HYDROXYZINE PAMOATE) 1-2 tabs by mouth at bedtime as needed for sleep   Medications Discontinued  DILAUDID 4 MG TABS (HYDROMORPHONE HCL) one tablet daily AMBIEN CR 12.5 MG CR-TABS (ZOLPIDEM TARTRATE) as needed ALPRAZOLAM 0.5 MG TABS (ALPRAZOLAM) one tablet 4 to 6 times daily as needed LORAZEPAM 2 MG TABS (LORAZEPAM) one tab by mouth twice daily as needed.   Sincerely Yours,    Lemont Fillers FNP  Appended Document: f/u / tf,cma pt rsch, couldn't find ride/dt--rm 4     Allergies: 1)  ! Phenergan 2)  ! Pcn  Physical Exam  General:  Well-developed,well-nourished,in no acute distress; alert,appropriate and cooperative throughout examination Lungs:  Normal respiratory effort, chest expands symmetrically. Lungs are clear to auscultation, no crackles or wheezes. Heart:  Normal rate and regular rhythm. S1 and S2 normal without gallop, murmur, click, rub or other extra sounds. Genitalia:  Pelvic Exam:        External: normal female genitalia with mild vulvar irritation noted.        Vagina: normal without lesions or masses        Cervix: normal without lesions or masses        Adnexa: normal bimanual exam without masses or fullness        Uterus: normal by palpation        Pap smear: not performed   Impression & Recommendations:  Problem # 1:  MUSCLE WEAKNESS (GENERALIZED) (ICD-728.87) Assessment Comment Only Likely due to deconditioning and polypharmacy  Problem # 2:  VAGINITIS (ICD-616.10) Wet prep was negative, nystatin cream to be applied to vulva.  Problem # 3:  POSTMENOPAUSAL STATUS (ICD-V49.81) Assessment: Comment Only Will plan to wean patient off of  estrogen supplement.  Dose was decreased today.  Complete Medication List: 1)  Gabapentin 300 Mg Caps (Gabapentin) .... One tab 2 to 4 times a day. 2)  Cymbalta 60 Mg Cpep (Duloxetine hcl) .... Take 1 tablet by mouth once a day 3)  Estradiol 1 Mg Tabs (Estradiol) .... One tablet by mouth once daily 4)  Soma 250 Mg Tabs (Carisoprodol) .... Take 1 tablet by mouth two times a day as needed 5)  Armour Thyroid 60 Mg Tabs (Thyroid) .... One tablet by mouth daily 6)  Topamax 25 Mg Tabs (Topiramate) .... One tablet by mouth two times a day 7)  Nystatin 100000 Unit/gm Oint (Nystatin) .... Apply twice daily to affected area for 1 week 8)  Clonazepam 2 Mg Tabs (Clonazepam) .... 1/2 tab by mouth in the am and in afternoon,  2 tabs by mouth at bedtime 9)  Mirtazapine 15 Mg Tbdp (Mirtazapine) .... One tablet by mouth at bedtime

## 2010-12-14 NOTE — Consult Note (Signed)
Summary: [facility]  [facility]   Imported By: Lanelle Bal 05/03/2010 13:17:11  _____________________________________________________________________  External Attachment:    Type:   Image     Comment:   External Document

## 2010-12-14 NOTE — Letter (Signed)
Summary: Letter for Post Office  Letter for Post Office   Imported By: Lanelle Bal 03/15/2010 14:07:59  _____________________________________________________________________  External Attachment:    Type:   Image     Comment:   External Document

## 2010-12-14 NOTE — Progress Notes (Signed)
Summary: CXR, fluconazole refill  Phone Note Call from Patient   Summary of Call: Pt is wanting her CXR results. Pls. advise.  Mervin Kung CMA  February 10, 2010 8:49 AM   Follow-up for Phone Call        CXR shows no pneumonia.  It does note a hiatal hernia- nothing to worry about. Follow-up by: Lemont Fillers FNP,  February 10, 2010 8:55 AM  Additional Follow-up for Phone Call Additional follow up Details #1::        Left message on machine to return call.  Nicki Guadalajara Fergerson CMA  February 10, 2010 10:32 AM   Pt. notified of CXR results and voices understanding.  She states she is having some vaginal itching and thinks she has a yeast infection. She would like a refill on Fluconazole. Pls. advise.  Mervin Kung CMA  February 10, 2010 2:01 PM     Additional Follow-up for Phone Call Additional follow up Details #2::    I sent fluconazole to her pharmacy.  If her symptoms do not improve with fluconazole she will need an office visit.  Follow-up by: Lemont Fillers FNP,  February 10, 2010 2:05 PM  Additional Follow-up for Phone Call Additional follow up Details #3:: Details for Additional Follow-up Action Taken: Pt. notified of refill sent to pharmacy. Advised pt. she would need to be seen if symptoms do not improve after taking med. Pt. voices understanding.  Nicki Guadalajara Fergerson CMA  February 10, 2010 2:23 PM   New/Updated Medications: FLUCONAZOLE 150 MG TABS (FLUCONAZOLE) one tab by mouth x 1 Prescriptions: FLUCONAZOLE 150 MG TABS (FLUCONAZOLE) one tab by mouth x 1  #1 x 0   Entered and Authorized by:   Lemont Fillers FNP   Signed by:   Lemont Fillers FNP on 02/10/2010   Method used:   [facility]  [address] [phone][facility] (retail)       [address]       [place], [place]  [address]       Ph: [phone] or [phone]       Fax: [phone]   RxID:   [id number]

## 2010-12-14 NOTE — Assessment & Plan Note (Signed)
Summary: sleeping issues- jr   Vital Signs:  Patient profile:   65 year old female Height:      67 inches Weight:      220.75 pounds BMI:     34.70 O2 Sat:      98 % on Room air Temp:     98.2 degrees F oral Pulse rate:   96 / minute Pulse rhythm:   regular Resp:     16 per minute BP sitting:   110 / 80  (right arm) Cuff size:   large  Vitals Entered By: Mervin Kung CMA (February 08, 2010 2:51 PM)  O2 Flow:  Room air CC: room 6   Pt states she goes 2-3 nights without sleep.  She has had these episodes for 30+ years.   Primary Care Provider:  Paulo Fruit MD  CC:  room 6   Pt states she goes 2-3 nights without sleep.  She has had these episodes for 30+ years..  History of Present Illness: Kayla [name] is a 65 year old female who presents today with complaint of insomnia.  Tells me that she has been taking increasing doses of alprazolam at night to help her sleep.   Patient had been seeing Dr. Jeanella Craze from [facility] who was a pain specialist- he was apparently also managing her anxiety.   Notes that he retired and that there is no-one in his place  Notes that she uses oxymorphone 4mg  several times a day, now down to 1/2 tablet by mouth daily.  Patient is requesting referral to pain management.    Depression- notes that she continues to see a therapist.  She is still upset RE: her husband leaving her 5 years ago to marry his [employer]omatic Services operational officer.  Does not see psychiatry.   Notes that she becomes SOB very easily and has + cough.  Requesting form filled for her handicapped  plate.    Allergies: 1)  ! Phenergan 2)  ! Pcn  Physical Exam  General:  Well-developed,well-nourished,in no acute distress; alert,appropriate and cooperative throughout examination Lungs:  Normal respiratory effort, chest expands symmetrically. Lungs are clear to auscultation, no crackles or wheezes. Heart:  Normal rate and regular rhythm. S1 and S2 normal without gallop, murmur, click, rub or other extra  sounds. Psych:  Oriented X3.  Anxious appearing, speech is somewhat pressured/rambling in nature.    Impression & Recommendations:  Problem # 1:  CHRONIC PAIN SYNDROME (ICD-338.4) Assessment Comment Only Will refer to pain specialist.  Patient tells me that she has been treated for her spinal stenosis as welll as her fibromyalgia.  Problem # 2:  DEPRESSION (ICD-311) Assessment: Deteriorated I believe that her insomnia is related to depression/anxiety.  I will refer to psychiatry.  Ambien refilled.  I instructed patient that I will be turning over the managemnent of her psychiatric meds to her psychiatrist once she is established,  she is reluctant to go to psychiatry- but I counselled her to do so as I do not feel that her current med regimen is optimized.   Her updated medication list for this problem includes:    Cymbalta 60 Mg Cpep (Duloxetine hcl) .Marland Kitchen... Take 1 tablet by mouth once a day    Alprazolam 0.5 Mg Tabs (Alprazolam) ..... One tablet 4 to 6 times daily    Lorazepam 2 Mg Tabs (Lorazepam) ..... One tab by mouth twice daily  Orders: Psychiatric Referral (Psych)  Problem # 3:  COUGH (ICD-786.2)  CMA noted that patient was SOB when  she walked in today, patient notes + cough,  CXR negative, 02 sat stable on RA.  Monitor for now.  I suspect that anxiety is playing a role.   Orders: CXR- 2view (CXR)  Complete Medication List: 1)  Gabapentin 300 Mg Caps (Gabapentin) .... One tab 2 to 4 times a day. 2)  Cymbalta 60 Mg Cpep (Duloxetine hcl) .... Take 1 tablet by mouth once a day 3)  Estradiol 2 Mg Tabs (Estradiol) .... Take 1 tablet by mouth once a day 4)  Dilaudid 4 Mg Tabs (Hydromorphone hcl) .... One tablet daily up to 6 times daily 5)  Ambien Cr 12.5 Mg Cr-tabs (Zolpidem tartrate) .... As needed 6)  Alprazolam 0.5 Mg Tabs (Alprazolam) .... One tablet 4 to 6 times daily 7)  Lorazepam 2 Mg Tabs (Lorazepam) .... One tab by mouth twice daily 8)  Soma 250 Mg Tabs (Carisoprodol)  .... Take 1 tablet by mouth two times a day as needed 9)  Topiramate Tabs (topiramate)  .... Take one tablet 1 - 2 times a day.  Other Orders: Pain Clinic Referral (Pain)  Patient Instructions: 1)  You will be contacted about your referral to pain clinic and psychology. 2)  Please follow up in 1 month. Prescriptions: LORAZEPAM 2 MG TABS (LORAZEPAM) one tab by mouth twice daily  #60 x 0   Entered and Authorized by:   Lemont Fillers FNP   Signed by:   Lemont Fillers FNP on 02/08/2010   Method used:   Print then Give to Patient   RxID:   [id number] AMBIEN CR 12.5 MG CR-TABS (ZOLPIDEM TARTRATE) as needed  #30 x 0   Entered and Authorized by:   Lemont Fillers FNP   Signed by:   Lemont Fillers FNP on 02/08/2010   Method used:   Print then Give to Patient   RxID:   [id number]   Current Allergies (reviewed today): ! PHENERGAN ! PCN

## 2010-12-14 NOTE — Progress Notes (Signed)
Summary: psychiatric appt.  Phone Note Outgoing Call   Summary of Call: [facility] her appointment with Psychiatrist?  Thanks Initial call taken by: Lemont Fillers FNP,  May 10, 2010 8:21 AM  Follow-up for Phone Call        Pt was seen on 05/06/10 and has follow-up with them on 06/03/10.  Mervin Kung CMA  May 10, 2010 9:34 AM

## 2010-12-15 ENCOUNTER — Telehealth: Payer: Self-pay | Admitting: Family

## 2010-12-22 NOTE — Progress Notes (Signed)
Summary: reduce estradiol  Phone Note Outgoing Call   Summary of Call: Please call patient and let her know that I would like her to decrease her estradiol from 1mg  to 0.5 mg by mouth daily for one month.  After she completes the one month she should discontinue completely.  This is due to possible increased risk of stroke, heart attack, blood clots and breast cancer especially with long term use.   Initial call taken by: Lemont Fillers FNP,  December 15, 2010 4:16 PM  Follow-up for Phone Call        Pt notified and voices understanding.  Follow-up by: Mervin Kung CMA Duncan Dull),  December 16, 2010 9:53 AM

## 2010-12-22 NOTE — Assessment & Plan Note (Signed)
Summary: f/u / tf,cma--rm 5   Vital Signs:  Patient profile:   65 year old female Height:      67 inches Temp:     98.3 degrees F oral Pulse rate:   84 / minute Pulse rhythm:   regular Resp:     20 per minute BP sitting:   120 / 90  (right arm) Cuff size:   large  Vitals Entered By: Mervin Kung CMA Duncan Dull) (December 14, 2010 3:45 PM) CC: Pt here for follow up. States she still has cough. Requesting refill on Soma. Is Patient Diabetic? No Comments Pt very confused today. States she got lost on the way to our office. Could not remember medications clearly. States she is now taking Sonata @ bedtime. Not sure if she is taking Clonazepam or Lorazepam.     Primary Care Provider:  Lemont Fillers [name]  CC:  Pt here for follow up. States she still has cough. Requesting refill on Soma..  History of Present Illness: [name] is a 65 year old female who presents today for follow up.  She tells me that the main reason that she is here today is for a refill on her Tresa Garter.    1)Fibromyalgia-  Pt was referred to [facility] for pain managment in June.  It was recommended to her that she enroll in aquatics therapy. She tells me that she did not do this as her insurance does not cover it. It was also recommended that she followup approximately one month later with their group which she failed to do. She tells me that she was "embarrassed" but she could not afford some of the services that they were recommending for her.  2) hormone replacement therapy-last visit her estradiol was cut down from 2 mg to 1 mg with plans to ultimately wean her off of it. She has been on this since she was in her early 66s.  Allergies: 1)  ! Phenergan 2)  ! Pcn  Past History:  Past Medical History: Last updated: 01/19/2009 Allergic rhinitis Anemia-NOS Colonic polyps, hx of Depression Diverticulitis, hx of GERD Headache Hyperlipidemia Hypothyroidism Osteoarthritis Urinary  incontinence migraine fibromyalgia  Past Surgical History: Last updated: 01/19/2009 Cholecystectomy Hysterectomy Tonsillectomy miscarriage  Physical Exam  General:  Well-developed,well-nourished,in no acute distress; alert,appropriate and cooperative throughout examination Psych:  somewhat disheveled appearing female.Oriented X3, normally interactive, and not depressed appearing.  mildly anxious appearing   Impression & Recommendations:  Problem # 1:  CHRONIC PAIN SYNDROME (ICD-338.4) Assessment Unchanged I have advised the patient to follow up with the pain management specialist for further evaluation of her pain, and evaluation for ongoing needs for soma. Last visit I was quite concerned with her overall muscle weakness and recent falls in setting of polypharmacy. I am concerned that she may be exhibiting some drug-seeking behavior.  Problem # 2:  POSTMENOPAUSAL STATUS (ICD-V49.81) Patient is now down to estradiol 1 mg.  Will plan to decrease her estradiol to 0.5 mg x 1 month and then discontinue.    Complete Medication List: 1)  Gabapentin 300 Mg Caps (Gabapentin) .... One tab 2 to 4 times a day. 2)  Cymbalta 60 Mg Cpep (Duloxetine hcl) .... Take 1 tablet by mouth twice a day 3)  Estradiol 0.5 Mg Tabs (Estradiol) .... One tablet by mouth daily 4)  Soma 250 Mg Tabs (Carisoprodol) .... Take 1 tablet by mouth two times a day as needed 5)  Armour Thyroid 60 Mg Tabs (Thyroid) .... One tablet  by mouth daily 6)  Topamax 25 Mg Tabs (Topiramate) .... One tablet by mouth two times a day 7)  Nystatin 100000 Unit/gm Oint (Nystatin) .... Apply twice daily to affected area for 1 week 8)  Clonazepam 2 Mg Tabs (Clonazepam) .... 1/2 tab to 1 tab  by mouth during day,  2 tabs by mouth at bedtime 9)  Mirtazapine 15 Mg Tbdp (Mirtazapine) .... 1/2 to 1  tablet by mouth at bedtime 10)  Prilosec Otc 20 Mg Tbec (Omeprazole magnesium) .... One tablet by mouth daily  Other Orders: TLB-TSH (Thyroid  Stimulating Hormone) ([id number])  Patient Instructions: 1)  Please call [facility] to schedule a follow up appointment in the pain clinic- Dr. Gloriann Loan [phone]. 2)  Follow up in 6 weeks so that we can see how your cough is doing. Prescriptions: ESTRADIOL 0.5 MG TABS (ESTRADIOL) one tablet by mouth daily  #30 x 0   Entered and Authorized by:   Kayla [name]   Signed by:   Kayla [name] on 12/15/2010   Method used:   [facility] to        [facility]  [address] [phone][facility] (retail)       [address]       [place], [place]  [address]       Ph: [phone] or [phone]       Fax: [phone]   RxID:   [id number]    Orders Added: 1)  TLB-TSH (Thyroid Stimulating Hormone) [[id number]] 2)  Est. Patient Level III [28315]    Current Allergies (reviewed today): ! PHENERGAN ! PCN

## 2011-02-02 ENCOUNTER — Other Ambulatory Visit: Payer: Self-pay | Admitting: Family

## 2011-02-02 NOTE — Telephone Encounter (Signed)
Refill should have been sent.  Please make sure she has a f/u apt scheduled. Thanks.

## 2011-03-02 ENCOUNTER — Other Ambulatory Visit: Payer: Self-pay | Admitting: Family

## 2011-03-10 ENCOUNTER — Other Ambulatory Visit: Payer: Self-pay | Admitting: Family

## 2011-03-15 ENCOUNTER — Other Ambulatory Visit: Payer: Self-pay | Admitting: Family

## 2011-03-16 NOTE — Telephone Encounter (Signed)
I would like pt to discontinue her estradiol.  No refills.

## 2011-03-16 NOTE — Telephone Encounter (Signed)
Pt was advised in February to complete 1 month of Estradiol 0.5mg  and then stop the medication. She received a refill of 0.5mg  dose in March and is requesting refill now. Should we deny Rx?

## 2011-03-17 NOTE — Telephone Encounter (Signed)
Refill denied. Left message for pt to return my call. Pt was to discontinue estradiol after completing the 0.5mg  rx.  Pt needs follow up with Melissa.

## 2011-03-31 NOTE — Telephone Encounter (Signed)
Left message on machine for pt to return my call  

## 2011-04-01 NOTE — Telephone Encounter (Signed)
Encounter closed as pt never returned my calls.

## 2011-04-07 ENCOUNTER — Encounter: Payer: Self-pay | Admitting: Family

## 2011-04-07 ENCOUNTER — Other Ambulatory Visit: Payer: Self-pay | Admitting: Family

## 2011-04-07 NOTE — Telephone Encounter (Signed)
Multiple messages were left for pt to return my calls. Pt returned my call yesterday and left message acknowledging my previous messages and to return her call. Attempted to reach pt re: need for appt and received voicemail. Left message for pt to call and schedule appt with Melissa before her current medication runs out.

## 2011-04-12 ENCOUNTER — Encounter: Payer: Self-pay | Admitting: Family

## 2011-04-12 ENCOUNTER — Ambulatory Visit: Payer: Self-pay | Admitting: Family

## 2011-04-13 ENCOUNTER — Ambulatory Visit (INDEPENDENT_AMBULATORY_CARE_PROVIDER_SITE_OTHER): Payer: PRIVATE HEALTH INSURANCE | Admitting: Family

## 2011-04-13 ENCOUNTER — Encounter: Payer: Self-pay | Admitting: Family

## 2011-04-13 DIAGNOSIS — IMO0001 Reserved for inherently not codable concepts without codable children: Secondary | ICD-10-CM

## 2011-04-13 DIAGNOSIS — J309 Allergic rhinitis, unspecified: Secondary | ICD-10-CM

## 2011-04-13 MED ORDER — LORATADINE 10 MG PO TABS: 10.0000 mg | ORAL_TABLET | Freq: Every day | ORAL | Status: DC | PRN

## 2011-04-13 NOTE — Assessment & Plan Note (Signed)
Add claritin.  Hopefully this will help with her dry cough.

## 2011-04-13 NOTE — Assessment & Plan Note (Signed)
She is being managed by pain management.

## 2011-04-13 NOTE — Patient Instructions (Signed)
Please schedule a welcome to medicare visit in August.

## 2011-04-13 NOTE — Progress Notes (Signed)
Subjective:    Kayla [name] ID: Kayla [name], female    DOB: [birth year], 65 y.o.   MRN: [id number]  HPI  Kayla [name] is a 65 yr old female who presents today with two concerns.  She reports that she was recently hospitalized at [facility] with chest pain.  She reports that she was told that her hear was "fine." She was hospitalized overnight.  They told Kayla [name] that they think it was her hiatal hernia which was causing her discomfort.  She denies discomfort at this time.   1. Cough- dry, She attributes to allergies.   2. Fibromyalgia-  She reports that she is concerned about the pain.  Feels discouraged. She is followed by pain management.    3. Intermittent light colored stools- last was greater than 1 month ago.      Review of Systems    see HPI  Past Medical History  Diagnosis Date  . Allergy     allergic rhinitis  . Anemia     NOS  . Depression   . GERD (gastroesophageal reflux disease)   . Hyperlipidemia   . Thyroid disease     hypothyroidism  . Arthritis     osteoarthritis  . Hx of colonic polyps   . History of diverticulitis of colon   . Headache   . Urinary incontinence   . Migraine   . Fibromyalgia   . Miscarriage     History   Social History  . Marital Status: Divorced    Spouse Name: N/A    Number of Children: N/A  . Years of Education: N/A   Occupational History  . unemployed    Social History Main Topics  . Smoking status: Never Smoker   . Smokeless tobacco: Never Used  . Alcohol Use: Yes  . Drug Use: Not on file  . Sexually Active: Not on file   Other Topics Concern  . Not on file   Social History Narrative  . No narrative on file    Past Surgical History  Procedure Date  . Cholecystectomy   . Abdominal hysterectomy   . Tonsillectomy     Family History  Problem Relation Age of Onset  . Cancer Other     female, brain, lung  . Heart disease Other   . Stroke Other   . Diabetes Other   . Arthritis Other   . Hyperlipidemia Other   .  Hypertension Other     Allergies  Allergen Reactions  . Penicillins     REACTION: rash  . Promethazine Hcl     Current Outpatient Prescriptions on File Prior to Visit  Medication Sig Dispense Refill  . ARMOUR THYROID 60 MG tablet TAKE 1 TABLET BY MOUTH EVERY DAY  30 tablet  0  . clonazePAM (KLONOPIN) 2 MG tablet Take 2 mg by mouth 3 (three) times daily as needed.       . DULoxetine (CYMBALTA) 60 MG capsule Take 60 mg by mouth 2 (two) times daily.       . mirtazapine (REMERON) 15 MG tablet Take 1/2 to 1 tablet by mouth at bedtime.       Marland Kitchen omeprazole (PRILOSEC OTC) 20 MG tablet Take 20 mg by mouth daily.        Marland Kitchen topiramate (TOPAMAX) 25 MG tablet TAKE ONE TABLET BY MOUTH TWO TIMES A DAY  60 tablet  0  . gabapentin (NEURONTIN) 300 MG capsule Take 300 mg by mouth. 2-4 times a day.       Marland Kitchen  DISCONTD: carisoprodol (SOMA) 250 MG tablet Take 350 mg by mouth 2 (two) times daily as needed.        Marland Kitchen DISCONTD: estradiol (ESTRACE) 0.5 MG tablet ONE TABLET BY MOUTH DAILY  30 tablet  0  . DISCONTD: nystatin (MYCOSTATIN) ointment Apply topically 2 (two) times daily. To affected area x 1 week.         BP 114/88  Pulse 78  Temp(Src) 98.4 F (36.9 C) (Oral)  Resp 16  Wt 230 lb 1.9 oz (104.382 kg)    Objective:   Physical Exam  Constitutional: She is oriented to person, place, and time. She appears well-developed and well-nourished.  Cardiovascular: Normal rate and regular rhythm.   Pulmonary/Chest: Effort normal and breath sounds normal.  Neurological: She is alert and oriented to person, place, and time.  Psychiatric: She has a normal mood and affect. Her behavior is normal. Judgment and thought content normal.          Assessment & Plan:

## 2011-05-03 ENCOUNTER — Other Ambulatory Visit: Payer: Self-pay | Admitting: Family

## 2011-05-10 ENCOUNTER — Other Ambulatory Visit: Payer: Self-pay | Admitting: Family

## 2011-06-10 ENCOUNTER — Encounter: Payer: Self-pay | Admitting: Family

## 2011-06-10 ENCOUNTER — Ambulatory Visit (HOSPITAL_BASED_OUTPATIENT_CLINIC_OR_DEPARTMENT_OTHER)
Admission: RE | Admit: 2011-06-10 | Discharge: 2011-06-10 | Disposition: A | Discharge status: Home / Self Care | Payer: Medicare Other | Source: Ambulatory Visit | Attending: Family | Admitting: Family

## 2011-06-10 ENCOUNTER — Ambulatory Visit (INDEPENDENT_AMBULATORY_CARE_PROVIDER_SITE_OTHER): Payer: Medicare Other | Admitting: Family

## 2011-06-10 VITALS — BP 120/78 | HR 90 | Temp 98.2°F | Resp 18 | Ht 67.0 in | Wt 231.0 lb

## 2011-06-10 DIAGNOSIS — M169 Osteoarthritis of hip, unspecified: Secondary | ICD-10-CM

## 2011-06-10 DIAGNOSIS — M25551 Pain in right hip: Secondary | ICD-10-CM

## 2011-06-10 DIAGNOSIS — N760 Acute vaginitis: Secondary | ICD-10-CM

## 2011-06-10 DIAGNOSIS — M25559 Pain in unspecified hip: Secondary | ICD-10-CM | POA: Insufficient documentation

## 2011-06-10 DIAGNOSIS — M25859 Other specified joint disorders, unspecified hip: Secondary | ICD-10-CM | POA: Insufficient documentation

## 2011-06-10 MED ORDER — FLUCONAZOLE 150 MG PO TABS: ORAL_TABLET | ORAL | Status: DC

## 2011-06-10 NOTE — Progress Notes (Signed)
Subjective:    Patient ID: Kayla [name], female    DOB: 1946/05/11, 65 y.o.   MRN: [id number]  HPI  [name] is a 65 yr old female who presents with chief complaint of right hip pain.  Notes that this is a chronic problem for her, but that it has recently worsened.  Noted occasionally feels a "crunching" sound in her hip. Pain is pulling Notes that if she lays other left side the pain is severe in her right hip.  She is using tramadol with minimal improvement.    Vaginal itching- not relieved by otc monistat. Requesting rx for diflucan.  Review of Systems See HPI  Past Medical History  Diagnosis Date  . Allergy     allergic rhinitis  . Anemia     NOS  . Depression   . GERD (gastroesophageal reflux disease)   . Hyperlipidemia   . Thyroid disease     hypothyroidism  . Arthritis     osteoarthritis  . Hx of colonic polyps   . History of diverticulitis of colon   . Headache   . Urinary incontinence   . Migraine   . Fibromyalgia   . Miscarriage     History   Social History  . Marital Status: Divorced    Spouse Name: N/A    Number of Children: N/A  . Years of Education: N/A   Occupational History  . unemployed    Social History Main Topics  . Smoking status: Never Smoker   . Smokeless tobacco: Never Used  . Alcohol Use: Yes  . Drug Use: Not on file  . Sexually Active: Not on file   Other Topics Concern  . Not on file   Social History Narrative  . No narrative on file    Past Surgical History  Procedure Date  . Cholecystectomy   . Abdominal hysterectomy   . Tonsillectomy     Family History  Problem Relation Age of Onset  . Cancer Other     female, brain, lung  . Heart disease Other   . Stroke Other   . Diabetes Other   . Arthritis Other   . Hyperlipidemia Other   . Hypertension Other     Allergies  Allergen Reactions  . Penicillins     REACTION: rash  . Promethazine Hcl     Current Outpatient Prescriptions on File Prior to Visit    Medication Sig Dispense Refill  . ARMOUR THYROID 60 MG tablet TAKE 1 TABLET BY MOUTH EVERY DAY  30 tablet  2  . clonazePAM (KLONOPIN) 2 MG tablet Take 2 mg by mouth 3 (three) times daily as needed.       . DULoxetine (CYMBALTA) 60 MG capsule Take 60 mg by mouth 2 (two) times daily.       Marland Kitchen loratadine (CLARITIN) 10 MG tablet Take 1 tablet (10 mg total) by mouth daily as needed for allergies.  30 tablet  2  . mirtazapine (REMERON) 15 MG tablet Take 1/2 to 1 tablet by mouth at bedtime.       . topiramate (TOPAMAX) 25 MG tablet TAKE ONE TABLET BY MOUTH TWO TIMES A DAY  60 tablet  2  . traMADol (ULTRAM) 50 MG tablet Take 50 mg by mouth every 6 (six) hours as needed.          BP 120/78  Pulse 90  Temp(Src) 98.2 F (36.8 C) (Oral)  Resp 18  Ht 5\' 7"  (1.702 m)  Wt 231  lb 0.6 oz (104.799 kg)  BMI 36.19 kg/m2  SpO2 98%  LMP 11/14/1978       Objective:   Physical Exam  Constitutional: She appears well-developed and well-nourished.  Cardiovascular: Normal rate and regular rhythm.   Pulmonary/Chest: Effort normal and breath sounds normal.  Musculoskeletal:       + pain with extension of right hip and with abduction of right hip.            Assessment & Plan:   No problem-specific assessment & plan notes found for this encounter.

## 2011-06-10 NOTE — Assessment & Plan Note (Signed)
Suspect DJD of right hip, will check x-ray.  Recommended short term use of aleve to help with pain.

## 2011-06-10 NOTE — Patient Instructions (Signed)
Please complete xray on the first floor today. We will call you with results. You may use Aleve 220mg  twice daily for 1-2 weeks to help with pain.  Follow up in 6 weeks.

## 2011-06-10 NOTE — Assessment & Plan Note (Signed)
Trial of diflucan. If no improvement will refer to GYN.

## 2011-06-12 ENCOUNTER — Telehealth: Payer: Self-pay | Admitting: Family

## 2011-06-12 DIAGNOSIS — M25551 Pain in right hip: Secondary | ICD-10-CM

## 2011-06-12 NOTE — Telephone Encounter (Signed)
Please call pt and let her know that her xray shows some arthritis in her hips and some degenerative disc disease in her lower back.  I would like for her to see an orthopedic specialist to further evaluate her hip pain.

## 2011-06-13 NOTE — Telephone Encounter (Signed)
Left message on machine to return my call. 

## 2011-06-13 NOTE — Telephone Encounter (Signed)
Pt.notified

## 2011-07-08 ENCOUNTER — Telehealth: Payer: Self-pay | Admitting: *Deleted

## 2011-07-08 MED ORDER — AMITRIPTYLINE HCL 25 MG PO TABS: 25.0000 mg | ORAL_TABLET | Freq: Every evening | ORAL | Status: DC | PRN

## 2011-07-08 NOTE — Telephone Encounter (Signed)
Rx sent to pharmacy. She should use as needed only.

## 2011-07-08 NOTE — Telephone Encounter (Signed)
Pt.notified

## 2011-07-08 NOTE — Telephone Encounter (Signed)
Received message from pt stating she has not been able to sleep the past few nights. States she was up all night long last night. Cannot fall asleep. Would like refill of Amitriptyline that she has taken in the past to help her sleep. Please advise.

## 2011-08-06 ENCOUNTER — Other Ambulatory Visit: Payer: Self-pay | Admitting: Family

## 2011-08-08 ENCOUNTER — Other Ambulatory Visit: Payer: Self-pay | Admitting: Family

## 2011-08-08 NOTE — Telephone Encounter (Signed)
Refill sent for armour thyroid 60mg  1 tablet daily #30 x no refills. Pt last seen in July and has no appts on file. When should pt f/u with [place]?

## 2011-08-09 NOTE — Telephone Encounter (Signed)
She is due for follow up. Please contact pt to arrange.

## 2011-08-11 NOTE — Telephone Encounter (Signed)
Left message for patient to return my call.

## 2011-09-09 ENCOUNTER — Telehealth: Payer: Self-pay | Admitting: *Deleted

## 2011-09-09 ENCOUNTER — Ambulatory Visit: Payer: Medicare Other | Admitting: Family

## 2011-09-09 MED ORDER — THYROID 60 MG PO TABS: 60.0000 mg | ORAL_TABLET | Freq: Every day | ORAL | Status: DC

## 2011-09-09 NOTE — Telephone Encounter (Signed)
Refill sent to pharmacy for armour thyroid 60mg  #30 x no refills. Pt's f/u was rescheduled to 09/12/11.

## 2011-09-12 ENCOUNTER — Ambulatory Visit: Payer: Medicare Other | Admitting: Family

## 2011-09-13 ENCOUNTER — Encounter: Payer: Self-pay | Admitting: Family

## 2011-09-13 ENCOUNTER — Telehealth: Payer: Self-pay | Admitting: *Deleted

## 2011-09-13 ENCOUNTER — Ambulatory Visit (INDEPENDENT_AMBULATORY_CARE_PROVIDER_SITE_OTHER): Payer: Medicare Other | Admitting: Family

## 2011-09-13 DIAGNOSIS — G43009 Migraine without aura, not intractable, without status migrainosus: Secondary | ICD-10-CM

## 2011-09-13 DIAGNOSIS — F329 Major depressive disorder, single episode, unspecified: Secondary | ICD-10-CM

## 2011-09-13 DIAGNOSIS — E039 Hypothyroidism, unspecified: Secondary | ICD-10-CM

## 2011-09-13 DIAGNOSIS — F3289 Other specified depressive episodes: Secondary | ICD-10-CM

## 2011-09-13 DIAGNOSIS — M25559 Pain in unspecified hip: Secondary | ICD-10-CM

## 2011-09-13 DIAGNOSIS — Z23 Encounter for immunization: Secondary | ICD-10-CM

## 2011-09-13 DIAGNOSIS — Z5181 Encounter for therapeutic drug level monitoring: Secondary | ICD-10-CM

## 2011-09-13 DIAGNOSIS — M25551 Pain in right hip: Secondary | ICD-10-CM

## 2011-09-13 MED ORDER — TOPIRAMATE 25 MG PO TABS: 25.0000 mg | ORAL_TABLET | Freq: Two times a day (BID) | ORAL | Status: DC

## 2011-09-13 NOTE — Assessment & Plan Note (Signed)
65 yr old female with right hip pain.  X-ray shows degenerative changes.  Will refer to Dr. Pearletha Forge for further evaluation.

## 2011-09-13 NOTE — Progress Notes (Signed)
Subjective:    Patient ID: Kayla [name], female    DOB: July 12, 1946, 65 y.o.   MRN: [id number]  HPI  Ms.  [name] is a 65 yr old female who presents today for follow up.  1)  Hip pain-sometimes she notes that it feels like the hip is going to come out of the socket.  Other days, just hurts to walk.    2) Hiatal hernia-  This was picked up back in 2011 on x-ray. Notes that she feels like she doesn't have any air.    6) Depression- she notes that her ex husband has reconnected with her.  It bothers her.    Review of Systems See HPI  Past Medical History  Diagnosis Date  . Allergy     allergic rhinitis  . Anemia     NOS  . Depression   . GERD (gastroesophageal reflux disease)   . Hyperlipidemia   . Thyroid disease     hypothyroidism  . Arthritis     osteoarthritis  . Hx of colonic polyps   . History of diverticulitis of colon   . Headache   . Urinary incontinence   . Migraine   . Fibromyalgia   . Miscarriage     History   Social History  . Marital Status: Divorced    Spouse Name: N/A    Number of Children: N/A  . Years of Education: N/A   Occupational History  . unemployed    Social History Main Topics  . Smoking status: Never Smoker   . Smokeless tobacco: Never Used  . Alcohol Use: Yes  . Drug Use: Not on file  . Sexually Active: Not on file   Other Topics Concern  . Not on file   Social History Narrative  . No narrative on file    Past Surgical History  Procedure Date  . Cholecystectomy   . Abdominal hysterectomy   . Tonsillectomy     Family History  Problem Relation Age of Onset  . Cancer Other     female, brain, lung  . Heart disease Other   . Stroke Other   . Diabetes Other   . Arthritis Other   . Hyperlipidemia Other   . Hypertension Other     Allergies  Allergen Reactions  . Adhesive (Tape)     blisters  . Penicillins     REACTION: rash  . Promethazine Hcl     Current Outpatient Prescriptions on File Prior to Visit    Medication Sig Dispense Refill  . carisoprodol (SOMA) 350 MG tablet Take 350 mg by mouth daily as needed.        . clonazePAM (KLONOPIN) 2 MG tablet Take 2 mg by mouth 3 (three) times daily as needed.       . loratadine (CLARITIN) 10 MG tablet Take 1 tablet (10 mg total) by mouth daily as needed for allergies.  30 tablet  2  . mirtazapine (REMERON) 15 MG tablet Take 1/2 to 1 tablet by mouth at bedtime.       Marland Kitchen thyroid (ARMOUR THYROID) 60 MG tablet Take 1 tablet (60 mg total) by mouth daily.  30 tablet  0  . traMADol (ULTRAM) 50 MG tablet Take 50 mg by mouth every 6 (six) hours as needed.          BP 140/82  Pulse 108  Temp(Src) 97.7 F (36.5 C) (Oral)  Resp 18  Ht 5\' 7"  (1.702 m)  Wt 222 lb 0.6 oz (  100.717 kg)  BMI 34.78 kg/m2  SpO2 100%  LMP 11/14/1978       Objective:   Physical Exam  Constitutional: She appears well-developed and well-nourished. No distress.  Cardiovascular: Normal rate and regular rhythm.   No murmur heard. Pulmonary/Chest: Effort normal and breath sounds normal. No respiratory distress. She has no wheezes. She has no rales. She exhibits no tenderness.  Psychiatric: Judgment normal. Her affect is labile. She is not aggressive and not combative. Thought content is not paranoid and not delusional. Cognition and memory are normal. She exhibits a depressed mood.       Speech is mildly pressured. She became mildly agitated several times when she discussed things that bothered her.  Difficulty staying on topic during interview.          Assessment & Plan:  Today's visit was limited by patient's difficulty answering questions- rambled on tangents and her tearfulness.  >25 minutes spent with the patient today.  >50% of that time was spent counseling pt on her depression. Depression appears to be her primary problem- see below.   Mild elevation of blood pressure- new.  Pt was counseled on low sodium diet, exercise, weight loss.   BP Readings from Last 3  Encounters:  09/13/11 140/82  06/10/11 120/78  04/13/11 114/88

## 2011-09-13 NOTE — Telephone Encounter (Signed)
Pt declined to have tsh, bmp and hepatic function panel drawn today. Pt was tearful and stated she just needed to go home but will return later this week to complete labs. Notified Sandford Craze, NP and was advised to call pharmacy and cancel remaining refills on Topamax (only give 30 day supply) until pt completes labs. [facility] and was advised to send a new rx and she will cancel previous rx when it comes through. Refill re-sent for Topamax #60 x no refills. Lab order forwarded to the lab.

## 2011-09-13 NOTE — Patient Instructions (Addendum)
Complete your lab work prior to leaving today. Please schedule a follow up with Dr. Sandria Manly for your depression. You will be contacted about your referral to Dr. Pearletha Forge. Follow up in 6 weeks.    Keep a low sodium diet.  2 Gram Low Sodium Diet A 2 gram sodium diet restricts the amount of sodium in the diet to no more than 2 g or 2000 mg daily. Limiting the amount of sodium is often used to help lower blood pressure. It is important if you have heart, liver, or kidney problems. Many foods contain sodium for flavor and sometimes as a preservative. When the amount of sodium in a diet needs to be low, it is important to know what to look for when choosing foods and drinks. The following includes some information and guidelines to help make it easier for you to adapt to a low sodium diet. QUICK TIPS  Do not add salt to food.   Avoid convenience items and fast food.   Choose unsalted snack foods.   Buy lower sodium products, often labeled as "lower sodium" or "no salt added."   Check food labels to learn how much sodium is in 1 serving.   When eating at a restaurant, ask that your food be prepared with less salt or none, if possible.  READING FOOD LABELS FOR SODIUM INFORMATION The nutrition facts label is a good place to find how much sodium is in foods. Look for products with no more than 500 to 600 mg of sodium per meal and no more than 150 mg per serving. Remember that 2 g = 2000 mg. The food label may also list foods as:  Sodium-free: Less than 5 mg in a serving.   Very low sodium: 35 mg or less in a serving.   Low-sodium: 140 mg or less in a serving.   Light in sodium: 50% less sodium in a serving. For example, if a food that usually has 300 mg of sodium is changed to become light in sodium, it will have 150 mg of sodium.   Reduced sodium: 25% less sodium in a serving. For example, if a food that usually has 400 mg of sodium is changed to reduced sodium, it will have 300 mg of sodium.   CHOOSING FOODS Grains  Avoid: Salted crackers and snack items. Some cereals, including instant hot cereals. Bread stuffing and biscuit mixes. Seasoned rice or pasta mixes.   Choose: Unsalted snack items. Low-sodium cereals, oats, puffed wheat and rice, shredded wheat. English muffins and bread. Pasta.  Meats  Avoid: Salted, canned, smoked, spiced, pickled meats, including fish and poultry. Bacon, ham, sausage, cold cuts, hot dogs, anchovies.   Choose: Low-sodium canned tuna and salmon. Fresh or frozen meat, poultry, and fish.  Dairy  Avoid: Processed cheese and spreads. Cottage cheese. Buttermilk and condensed milk. Regular cheese.   Choose: Milk. Low-sodium cottage cheese. Yogurt. Sour cream. Low-sodium cheese.  Fruits and Vegetables  Avoid: Regular canned vegetables. Regular canned tomato sauce and paste. Frozen vegetables in sauces. Olives. Rosita Fire. Relishes. Sauerkraut.   Choose: Low-sodium canned vegetables. Low-sodium tomato sauce and paste. Frozen or fresh vegetables. Fresh and frozen fruit.  Condiments  Avoid: Canned and packaged gravies. Worcestershire sauce. Tartar sauce. Barbecue sauce. Soy sauce. Steak sauce. Ketchup. Onion, garlic, and table salt. Meat flavorings and tenderizers.   Choose: Fresh and dried herbs and spices. Low-sodium varieties of mustard and ketchup. Lemon juice. Tabasco sauce. Horseradish.  SAMPLE 2 GRAM SODIUM MEAL PLAN Breakfast /  Sodium (mg)  1 cup low-fat milk / 143 mg   2 slices whole-wheat toast / 270 mg   1 tbs heart-healthy margarine / 153 mg   1 hard-boiled egg / 139 mg   1 small orange / 0 mg  Lunch / Sodium (mg)  1 cup raw carrots / 76 mg    cup hummus / 298 mg   1 cup low-fat milk / 143 mg    cup red grapes / 2 mg   1 whole-wheat pita bread / 356 mg  Dinner / Sodium (mg)  1 cup whole-wheat pasta / 2 mg   1 cup low-sodium tomato sauce / 73 mg   3 oz lean ground beef / 57 mg   1 small side salad (1 cup raw spinach  leaves,  cup cucumber,  cup yellow bell pepper) with 1 tsp olive oil and 1 tsp red wine vinegar / 25 mg  Snack / Sodium (mg)  1 container low-fat vanilla yogurt / 107 mg   3 graham cracker squares / 127 mg  Nutrient Analysis  Calories: 2033   Protein: 77 g   Carbohydrate: 282 g   Fat: 72 g   Sodium: 1971 mg  Document Released: 10/31/2005 Document Revised: 07/13/2011 Document Reviewed: 02/01/2010 [facility] Patient Information 2012 [place], [place].

## 2011-09-13 NOTE — Assessment & Plan Note (Signed)
Deteriorated. She stopped seeing psychiatry.  Pain management stopped her cymbalta and put her on savella. She does not note an improvement in her fibromyalgia pain with this switch but her depression has worsened.  I have instructed her to stop savella, restart cymbalta and schedule a follow up appointment with psychiatry.  She verbalizes understanding.

## 2011-09-13 NOTE — Assessment & Plan Note (Signed)
Refill topamax, check bmet and lfts today.

## 2011-09-13 NOTE — Assessment & Plan Note (Signed)
Check TSH 

## 2011-09-21 ENCOUNTER — Ambulatory Visit: Payer: Medicare Other | Admitting: Family Medicine

## 2011-09-30 ENCOUNTER — Telehealth: Payer: Self-pay | Admitting: Family

## 2011-09-30 NOTE — Telephone Encounter (Signed)
Please call pt and remind her to complete lab work ordered last visit at her earliest convenience.

## 2011-10-03 NOTE — Telephone Encounter (Signed)
Notified pt and she states she will return to the lab this week.

## 2011-10-03 NOTE — Telephone Encounter (Signed)
Left message on machine to return my call. 

## 2011-10-05 LAB — BASIC METABOLIC PANEL
BUN: 15 mg/dL (ref 6–23)
CO2: 26 mEq/L (ref 19–32)
Calcium: 9.8 mg/dL (ref 8.4–10.5)
Creat: 0.84 mg/dL (ref 0.50–1.10)
Glucose, Bld: 95 mg/dL (ref 70–99)

## 2011-10-05 LAB — HEPATIC FUNCTION PANEL
Albumin: 4.4 g/dL (ref 3.5–5.2)
Alkaline Phosphatase: 69 U/L (ref 39–117)
Total Protein: 7.2 g/dL (ref 6.0–8.3)

## 2011-10-10 NOTE — Telephone Encounter (Signed)
Labs have been completed. Please advise.

## 2011-10-14 ENCOUNTER — Other Ambulatory Visit: Payer: Self-pay | Admitting: Family

## 2011-10-16 ENCOUNTER — Other Ambulatory Visit: Payer: Self-pay | Admitting: Family

## 2011-10-17 MED ORDER — THYROID 30 MG PO TABS: ORAL_TABLET | ORAL | Status: DC

## 2011-10-17 NOTE — Telephone Encounter (Signed)
Left message on machine to return my clal.

## 2011-10-17 NOTE — Telephone Encounter (Signed)
Melissa labs from 10/04/2011 for TSH not sure if they were addressed. Refill request on Armour Thyroid. Should she continue with the current dosing.

## 2011-10-17 NOTE — Telephone Encounter (Signed)
Pls call pt and let her know that I am going to decrease her Armor thyroid from 60mg  to 45 mg.  She she return to the lab in 1 month for a follow up TSH.  I will have her take 30mg  tabs 1.5 tabs daily. Rx has been sent to the lab.

## 2011-10-18 ENCOUNTER — Telehealth: Payer: Self-pay

## 2011-10-18 NOTE — Telephone Encounter (Signed)
Pt was calling Kayla [name] back. I informed patient of the dosage change and per patient request sent patient a copy of her labs

## 2011-10-25 ENCOUNTER — Ambulatory Visit: Payer: Medicare Other | Admitting: Family

## 2011-11-21 ENCOUNTER — Other Ambulatory Visit: Payer: Self-pay | Admitting: Family

## 2011-11-23 DIAGNOSIS — M545 Low back pain: Secondary | ICD-10-CM | POA: Diagnosis not present

## 2011-11-23 DIAGNOSIS — IMO0001 Reserved for inherently not codable concepts without codable children: Secondary | ICD-10-CM | POA: Diagnosis not present

## 2011-11-23 DIAGNOSIS — F331 Major depressive disorder, recurrent, moderate: Secondary | ICD-10-CM | POA: Diagnosis not present

## 2011-12-13 ENCOUNTER — Encounter: Payer: Self-pay | Admitting: Family

## 2011-12-13 ENCOUNTER — Ambulatory Visit (HOSPITAL_BASED_OUTPATIENT_CLINIC_OR_DEPARTMENT_OTHER)
Admission: RE | Admit: 2011-12-13 | Discharge: 2011-12-13 | Disposition: A | Discharge status: Home / Self Care | Payer: Medicare Other | Source: Ambulatory Visit | Attending: Family | Admitting: Family

## 2011-12-13 ENCOUNTER — Ambulatory Visit (INDEPENDENT_AMBULATORY_CARE_PROVIDER_SITE_OTHER): Payer: Medicare Other | Admitting: Family

## 2011-12-13 VITALS — BP 140/88 | HR 97 | Temp 98.7°F | Resp 18 | Ht 67.0 in | Wt 214.1 lb

## 2011-12-13 DIAGNOSIS — K5900 Constipation, unspecified: Secondary | ICD-10-CM

## 2011-12-13 DIAGNOSIS — L293 Anogenital pruritus, unspecified: Secondary | ICD-10-CM | POA: Diagnosis not present

## 2011-12-13 DIAGNOSIS — R05 Cough: Secondary | ICD-10-CM

## 2011-12-13 DIAGNOSIS — N898 Other specified noninflammatory disorders of vagina: Secondary | ICD-10-CM

## 2011-12-13 DIAGNOSIS — R159 Full incontinence of feces: Secondary | ICD-10-CM

## 2011-12-13 DIAGNOSIS — R059 Cough, unspecified: Secondary | ICD-10-CM

## 2011-12-13 DIAGNOSIS — R197 Diarrhea, unspecified: Secondary | ICD-10-CM | POA: Diagnosis not present

## 2011-12-13 DIAGNOSIS — I998 Other disorder of circulatory system: Secondary | ICD-10-CM | POA: Diagnosis not present

## 2011-12-13 MED ORDER — OMEPRAZOLE 20 MG PO CPDR: 20.0000 mg | DELAYED_RELEASE_CAPSULE | Freq: Every day | ORAL | Status: DC

## 2011-12-13 MED ORDER — OMEPRAZOLE 40 MG PO CPDR: 40.0000 mg | DELAYED_RELEASE_CAPSULE | Freq: Every day | ORAL | Status: DC

## 2011-12-13 NOTE — Patient Instructions (Addendum)
Please complete your abdominal x-ray on the first floor.  You will be contacted about your referral to the Gynecologist and to GI for colonoscopy and further evaluation.  Follow up in 6 weeks.

## 2011-12-13 NOTE — Assessment & Plan Note (Signed)
66 yr old female with intermittent diarrhea alternating with constipation. Also has occasional fecal incontinence x 1 year. Will obtain KUB to rule out fecal impaction.  Will also refer to GI for further evaluation.

## 2011-12-13 NOTE — Assessment & Plan Note (Signed)
This is a chronic problem and is unchanged. Will refer to GYN for further evaluation.  I suspect that vaginal atrophy is cause.

## 2011-12-13 NOTE — Progress Notes (Signed)
Subjective:    Patient ID: Kayla [name], female    DOB: 1946/08/19, 66 y.o.   MRN: [id number]  HPI  [name] is a 66 yr old female who presents today to address several concerns.    1) Constipation-  She reports that she had episode of severe rectal pain >5 yrs ago.  This happens every few months or so. Her last BM was today and was soft.  Yesterday she had diarrhea.  She has sensation that her underwear is "bunched up" at times, but when she reaches back to check, she finds that this is not the case.  She reports difficulty controlling her stools off and on for about a year. She has occasional fecal incontinence.  She reports hx of colonoscopy about 7 yrs ago.    She also reports vaginal itching-  Unchanged.  She did not see GYN.  She would like to see Kayla [name].    Cough- Reports that this has been constant.  She reports that she has been taking OTC prilosec most days.   Review of Systems See HPI  Past Medical History  Diagnosis Date  . Allergy     allergic rhinitis  . Anemia     NOS  . Depression   . GERD (gastroesophageal reflux disease)   . Hyperlipidemia   . Thyroid disease     hypothyroidism  . Arthritis     osteoarthritis  . Hx of colonic polyps   . History of diverticulitis of colon   . Headache   . Urinary incontinence   . Migraine   . Fibromyalgia   . Miscarriage     History   Social History  . Marital Status: Divorced    Spouse Name: N/A    Number of Children: N/A  . Years of Education: N/A   Occupational History  . unemployed    Social History Main Topics  . Smoking status: Never Smoker   . Smokeless tobacco: Never Used  . Alcohol Use: Yes  . Drug Use: Not on file  . Sexually Active: Not on file   Other Topics Concern  . Not on file   Social History Narrative  . No narrative on file    Past Surgical History  Procedure Date  . Cholecystectomy   . Abdominal hysterectomy   . Tonsillectomy     Family History  Problem Relation Age  of Onset  . Cancer Other     female, brain, lung  . Heart disease Other   . Stroke Other   . Diabetes Other   . Arthritis Other   . Hyperlipidemia Other   . Hypertension Other     Allergies  Allergen Reactions  . Adhesive (Tape)     blisters  . Penicillins     REACTION: rash  . Promethazine Hcl     Current Outpatient Prescriptions on File Prior to Visit  Medication Sig Dispense Refill  . carisoprodol (SOMA) 350 MG tablet Take 350 mg by mouth daily as needed.        . clonazePAM (KLONOPIN) 2 MG tablet Take 2 mg by mouth 3 (three) times daily as needed.       . DULoxetine (CYMBALTA) 60 MG capsule Take 60 mg by mouth daily.        Marland Kitchen loratadine (CLARITIN) 10 MG tablet Take 1 tablet (10 mg total) by mouth daily as needed for allergies.  30 tablet  2  . mirtazapine (REMERON) 15 MG tablet Take 1/2 to  1 tablet by mouth at bedtime.       Marland Kitchen thyroid (ARMOUR) 30 MG tablet One and one half tablets by mouth once daily  45 tablet  1  . topiramate (TOPAMAX) 25 MG tablet TAKE 1 TABLET (25 MG TOTAL) BY MOUTH 2 (TWO) TIMES DAILY.  60 tablet  1    BP 140/88  Pulse 97  Temp(Src) 98.7 F (37.1 C) (Oral)  Resp 18  Ht 5\' 7"  (1.702 m)  Wt 214 lb 1.9 oz (97.124 kg)  BMI 33.54 kg/m2  SpO2 97%  LMP 11/14/1978       Objective:   Physical Exam  Constitutional: She appears well-developed and well-nourished.  Cardiovascular: Normal rate and regular rhythm.   No murmur heard. Pulmonary/Chest: Effort normal and breath sounds normal.  Genitourinary:       Fair rectal tone,  guaic neg.  No palpable hemorrhoids or rectal masses noted.   Skin: Skin is warm and dry. No rash noted. No erythema. No pallor.  Psychiatric: Her speech is normal and behavior is normal. Judgment and thought content normal. Her mood appears anxious.          Assessment & Plan:

## 2011-12-13 NOTE — Assessment & Plan Note (Signed)
Will increase omeprazole to 40 mg and have the pt follow up in 6 weeks. If no improvement in symptoms, obtain PFTs.  50 minutes were spent face to face with the patient today.  >50% of this time was spent counseling pt on her fecal incontinence, diarrhea, constipation and plan of care.

## 2011-12-14 ENCOUNTER — Telehealth: Payer: Self-pay | Admitting: Family

## 2011-12-14 ENCOUNTER — Encounter: Payer: Self-pay | Admitting: Gastroenterology

## 2011-12-14 NOTE — Telephone Encounter (Signed)
Notified pt. 

## 2011-12-14 NOTE — Telephone Encounter (Signed)
Attempted to reach pt and left message on voicemail to return my call. 

## 2011-12-14 NOTE — Telephone Encounter (Signed)
Left message on machine to return my call. 

## 2011-12-14 NOTE — Telephone Encounter (Signed)
Patient returned phone call. Best # [phone]

## 2011-12-14 NOTE — Telephone Encounter (Signed)
Please call pt and let her know that her abdominal x ray is normal.

## 2011-12-22 DIAGNOSIS — M545 Low back pain: Secondary | ICD-10-CM | POA: Diagnosis not present

## 2011-12-22 DIAGNOSIS — F331 Major depressive disorder, recurrent, moderate: Secondary | ICD-10-CM | POA: Diagnosis not present

## 2011-12-22 DIAGNOSIS — IMO0001 Reserved for inherently not codable concepts without codable children: Secondary | ICD-10-CM | POA: Diagnosis not present

## 2011-12-26 ENCOUNTER — Ambulatory Visit: Payer: Medicare Other | Admitting: Gastroenterology

## 2011-12-27 ENCOUNTER — Other Ambulatory Visit: Payer: Self-pay | Admitting: Family

## 2011-12-29 DIAGNOSIS — L940 Localized scleroderma [morphea]: Secondary | ICD-10-CM | POA: Diagnosis not present

## 2012-01-03 ENCOUNTER — Ambulatory Visit: Payer: Medicare Other | Admitting: Gastroenterology

## 2012-01-13 DIAGNOSIS — R1310 Dysphagia, unspecified: Secondary | ICD-10-CM | POA: Diagnosis not present

## 2012-01-13 DIAGNOSIS — K219 Gastro-esophageal reflux disease without esophagitis: Secondary | ICD-10-CM | POA: Diagnosis not present

## 2012-01-13 DIAGNOSIS — D126 Benign neoplasm of colon, unspecified: Secondary | ICD-10-CM | POA: Diagnosis not present

## 2012-01-13 DIAGNOSIS — R197 Diarrhea, unspecified: Secondary | ICD-10-CM | POA: Diagnosis not present

## 2012-01-23 ENCOUNTER — Other Ambulatory Visit: Payer: Self-pay | Admitting: Family

## 2012-01-24 ENCOUNTER — Ambulatory Visit: Payer: Medicare Other | Admitting: Family

## 2012-02-01 ENCOUNTER — Ambulatory Visit (INDEPENDENT_AMBULATORY_CARE_PROVIDER_SITE_OTHER): Payer: Medicare Other | Admitting: Family

## 2012-02-01 VITALS — BP 118/74 | HR 92 | Temp 97.8°F | Resp 16 | Wt 217.1 lb

## 2012-02-01 DIAGNOSIS — IMO0001 Reserved for inherently not codable concepts without codable children: Secondary | ICD-10-CM

## 2012-02-01 DIAGNOSIS — E039 Hypothyroidism, unspecified: Secondary | ICD-10-CM

## 2012-02-01 DIAGNOSIS — R05 Cough: Secondary | ICD-10-CM

## 2012-02-01 DIAGNOSIS — M2550 Pain in unspecified joint: Secondary | ICD-10-CM | POA: Insufficient documentation

## 2012-02-01 DIAGNOSIS — M7918 Myalgia, other site: Secondary | ICD-10-CM

## 2012-02-01 MED ORDER — MOMETASONE FURO-FORMOTEROL FUM 100-5 MCG/ACT IN AERO: 2.0000 | INHALATION_SPRAY | Freq: Two times a day (BID) | RESPIRATORY_TRACT | Status: DC

## 2012-02-01 NOTE — Progress Notes (Signed)
Subjective:    Patient ID: Kayla [name], female    DOB: Dec 27, 1945, 66 y.o.   MRN: [id number]  HPI  Kayla [name] is a 66 yr old female who presents today with chief complaint of severe pinch beneath the left axilla. Lasts only a few seconds.  Started 1 week ago. Denies associated rash.  Denies unusual shortness of breath.  Cough- reports that her coughing is unchanged despite the omeprazole. She notes some post nasal drip.  Dentist told her that she has a polyp in her sinus.  She declines referral to ENT.   Hypothyroid- has only been taking one tab of armour thyroid a day rather than 1.5 tabs.    Review of Systems See HPI  Past Medical History  Diagnosis Date  . Allergy     allergic rhinitis  . Anemia     NOS  . Depression   . GERD (gastroesophageal reflux disease)   . Hyperlipidemia   . Thyroid disease     hypothyroidism  . Arthritis     osteoarthritis  . Hx of colonic polyps   . History of diverticulitis of colon   . Headache   . Urinary incontinence   . Migraine   . Fibromyalgia   . Miscarriage     History   Social History  . Marital Status: Divorced    Spouse Name: N/A    Number of Children: N/A  . Years of Education: N/A   Occupational History  . unemployed    Social History Main Topics  . Smoking status: Never Smoker   . Smokeless tobacco: Never Used  . Alcohol Use: Yes  . Drug Use: Not on file  . Sexually Active: Not on file   Other Topics Concern  . Not on file   Social History Narrative  . No narrative on file    Past Surgical History  Procedure Date  . Cholecystectomy   . Abdominal hysterectomy   . Tonsillectomy     Family History  Problem Relation Age of Onset  . Cancer Other     female, brain, lung  . Heart disease Other   . Stroke Other   . Diabetes Other   . Arthritis Other   . Hyperlipidemia Other   . Hypertension Other     Allergies  Allergen Reactions  . Adhesive (Tape)     blisters  . Penicillins     REACTION: rash   . Promethazine Hcl     Current Outpatient Prescriptions on File Prior to Visit  Medication Sig Dispense Refill  . ARMOUR THYROID 30 MG tablet ONE AND ONE HALF TABLETS BY MOUTH ONCE DAILY  45 tablet  1  . carisoprodol (SOMA) 350 MG tablet Take 350 mg by mouth daily as needed.        . clonazePAM (KLONOPIN) 2 MG tablet Take 2 mg by mouth at bedtime as needed.       . cyclobenzaprine (FLEXERIL) 10 MG tablet Take 1 tablet by mouth at bedtime as needed. Alternate with SOMA.      . DULoxetine (CYMBALTA) 60 MG capsule Take 60 mg by mouth daily.        Marland Kitchen gabapentin (NEURONTIN) 300 MG capsule Take 2 capsules three times a day      . mirtazapine (REMERON) 15 MG tablet Take 1 tablet by mouth at bedtime.      . NUCYNTA 100 MG TABS Take by mouth as needed.      Marland Kitchen omeprazole (PRILOSEC) 40  MG capsule Take 1 capsule (40 mg total) by mouth daily.  30 capsule  3  . topiramate (TOPAMAX) 25 MG tablet TAKE 1 TABLET (25 MG TOTAL) BY MOUTH 2 (TWO) TIMES DAILY.  60 tablet  2  . HYDROcodone-acetaminophen (NORCO) 5-325 MG per tablet Take 1 tablet by mouth as needed.        BP 118/74  Pulse 92  Temp(Src) 97.8 F (36.6 C) (Oral)  Resp 16  Wt 217 lb 1.3 oz (98.467 kg)  SpO2 98%  LMP 11/14/1978       Objective:   Physical Exam  Constitutional: She appears well-developed and well-nourished. No distress.  Cardiovascular: Normal rate and regular rhythm.   No murmur heard. Pulmonary/Chest: Effort normal and breath sounds normal. No respiratory distress. She has no wheezes. She has no rales. She exhibits no tenderness.  Musculoskeletal:       + tenderness to palpation beneath left axilla without mass, redness or rash.           Assessment & Plan:

## 2012-02-01 NOTE — Assessment & Plan Note (Signed)
66 yr old female with tenderness to palpation beneath left axilla. Likely musculoskeletal in nature. Asked pt to call us if her symptoms worsen, or if no improvement over the next few weeks.

## 2012-02-01 NOTE — Patient Instructions (Signed)
Please complete your lab work prior to leaving today.  Follow up in 3 months.  

## 2012-02-01 NOTE — Assessment & Plan Note (Signed)
Pt was confused with dosing regimen for armour thyroid. Obtain TSH, pt was counseled on proper dosing today.

## 2012-02-02 ENCOUNTER — Telehealth: Payer: Self-pay | Admitting: Family

## 2012-02-02 DIAGNOSIS — E039 Hypothyroidism, unspecified: Secondary | ICD-10-CM

## 2012-02-02 LAB — TSH: TSH: 0.329 u[IU]/mL — ABNORMAL LOW (ref 0.350–4.500)

## 2012-02-02 MED ORDER — THYROID 15 MG PO TABS: 15.0000 mg | ORAL_TABLET | Freq: Every day | ORAL | Status: DC

## 2012-02-02 NOTE — Telephone Encounter (Signed)
Left message on machine to return my call. 

## 2012-02-02 NOTE — Telephone Encounter (Signed)
Pls call pt and let her know th based on her lab results, I would like her to decrease armour thyroid to 15mg  once daily. Follow up TSH in 4-6 weeks pls (hypothyroid).

## 2012-02-02 NOTE — Telephone Encounter (Signed)
Pt notified. Copy of result and f/u TSH order mailed to pt as a reminder. Future lab order placed for the week of 03/08/12 and copy forwarded to the lab.

## 2012-02-03 NOTE — Assessment & Plan Note (Signed)
Spirometry was performed today in the office and I have personally reviewed it. FEV1- 87% predicted pre-bronchodilater, post rose to 102% predicted.  Cough may be asthma variant.  Will give trial of Dulera. Sample provided today.

## 2012-02-14 DIAGNOSIS — IMO0001 Reserved for inherently not codable concepts without codable children: Secondary | ICD-10-CM | POA: Diagnosis not present

## 2012-02-14 DIAGNOSIS — M545 Low back pain: Secondary | ICD-10-CM | POA: Diagnosis not present

## 2012-02-21 ENCOUNTER — Other Ambulatory Visit: Payer: Self-pay | Admitting: Family

## 2012-02-21 NOTE — Telephone Encounter (Signed)
Per documentation, pt should be taking 15mg  of Armour Thyroid daily. Current request denied as it is not the correct strength. Spoke to pharmacist and she verified that they filled new dose (15mg  daily) in March. Current request was an auto refill fax, we can discard.

## 2012-02-24 DIAGNOSIS — K2940 Chronic atrophic gastritis without bleeding: Secondary | ICD-10-CM | POA: Diagnosis not present

## 2012-02-24 DIAGNOSIS — D126 Benign neoplasm of colon, unspecified: Secondary | ICD-10-CM | POA: Diagnosis not present

## 2012-02-24 DIAGNOSIS — R197 Diarrhea, unspecified: Secondary | ICD-10-CM | POA: Diagnosis not present

## 2012-02-24 DIAGNOSIS — K2970 Gastritis, unspecified, without bleeding: Secondary | ICD-10-CM | POA: Diagnosis not present

## 2012-02-24 DIAGNOSIS — K5730 Diverticulosis of large intestine without perforation or abscess without bleeding: Secondary | ICD-10-CM | POA: Diagnosis not present

## 2012-02-24 DIAGNOSIS — R1310 Dysphagia, unspecified: Secondary | ICD-10-CM | POA: Diagnosis not present

## 2012-02-24 DIAGNOSIS — R12 Heartburn: Secondary | ICD-10-CM | POA: Diagnosis not present

## 2012-02-24 DIAGNOSIS — K6389 Other specified diseases of intestine: Secondary | ICD-10-CM | POA: Diagnosis not present

## 2012-02-24 DIAGNOSIS — K639 Disease of intestine, unspecified: Secondary | ICD-10-CM | POA: Diagnosis not present

## 2012-02-24 DIAGNOSIS — Z86010 Personal history of colonic polyps: Secondary | ICD-10-CM | POA: Diagnosis not present

## 2012-02-27 DIAGNOSIS — R197 Diarrhea, unspecified: Secondary | ICD-10-CM | POA: Diagnosis not present

## 2012-03-12 DIAGNOSIS — IMO0001 Reserved for inherently not codable concepts without codable children: Secondary | ICD-10-CM | POA: Diagnosis not present

## 2012-03-12 DIAGNOSIS — F411 Generalized anxiety disorder: Secondary | ICD-10-CM | POA: Diagnosis not present

## 2012-03-12 DIAGNOSIS — M545 Low back pain: Secondary | ICD-10-CM | POA: Diagnosis not present

## 2012-04-12 ENCOUNTER — Telehealth: Payer: Self-pay | Admitting: *Deleted

## 2012-04-12 MED ORDER — ONDANSETRON HCL 4 MG PO TABS: 4.0000 mg | ORAL_TABLET | Freq: Three times a day (TID) | ORAL | Status: AC | PRN

## 2012-04-12 NOTE — Telephone Encounter (Signed)
Attempted to reach pt, left detailed message on home #. 

## 2012-04-12 NOTE — Telephone Encounter (Signed)
Received message from pt stating she has had nausea since Friday, diarrhea over the weekend but was resolved yesterday. Continues to have nausea and headache. Pt states she feels too bad to try to drive to the office. She is requesting an Rx for Tigan as she reports taking that in the past for nausea.  Please advise.

## 2012-04-12 NOTE — Telephone Encounter (Signed)
I would recommend zofran instead. Rx sent to pharmacy.  She should be seen in office in AM if persistent nausea.  Go to ER overnight if unable to keep down food/liquid.

## 2012-05-04 ENCOUNTER — Encounter: Payer: Self-pay | Admitting: Family

## 2012-05-04 ENCOUNTER — Ambulatory Visit (INDEPENDENT_AMBULATORY_CARE_PROVIDER_SITE_OTHER): Payer: Medicare Other | Admitting: Family

## 2012-05-04 VITALS — BP 110/78 | HR 78 | Temp 97.8°F | Resp 16 | Ht 67.0 in | Wt 212.0 lb

## 2012-05-04 DIAGNOSIS — N949 Unspecified condition associated with female genital organs and menstrual cycle: Secondary | ICD-10-CM | POA: Diagnosis not present

## 2012-05-04 DIAGNOSIS — J45909 Unspecified asthma, uncomplicated: Secondary | ICD-10-CM | POA: Insufficient documentation

## 2012-05-04 DIAGNOSIS — R3989 Other symptoms and signs involving the genitourinary system: Secondary | ICD-10-CM | POA: Diagnosis not present

## 2012-05-04 DIAGNOSIS — E785 Hyperlipidemia, unspecified: Secondary | ICD-10-CM | POA: Diagnosis not present

## 2012-05-04 DIAGNOSIS — R102 Pelvic and perineal pain: Secondary | ICD-10-CM

## 2012-05-04 DIAGNOSIS — E039 Hypothyroidism, unspecified: Secondary | ICD-10-CM

## 2012-05-04 LAB — POCT URINALYSIS DIPSTICK
Leukocytes, UA: NEGATIVE
Nitrite, UA: NEGATIVE
Protein, UA: NEGATIVE
pH, UA: 5

## 2012-05-04 NOTE — Assessment & Plan Note (Signed)
Will obtain pelvic ultrasound- I suspect "mass" that pt is noting is actually a distended bladder.

## 2012-05-04 NOTE — Progress Notes (Signed)
Subjective:    Patient ID: Kayla [name], female    DOB: 07/19/1946, 66 y.o.   MRN: [id number]  HPI  [name] is a 66 yr old female who presents today for follow up.  She reports feeling queasy with  diarrhea for last several weeks.  No vomiting. Today she reports feeling better.  She has not taken her armour thyroid for 3 months. Last visit TSH was low and we recommended that she cut armour thyroid in half.  She tells me that she found them difficult to cut so she "just stopped taking them." She never followed up for TSH in April as instructed.    Asthma- Elwin Sleight helps her breathing when "she remembers to take it." Notes that her cough stops when she uses it as well. Had question about side effects but can't remember what her question was.  She reports "Bladder pain"-denies dysuria. Feels "something hard" in the lower abdomen.      Review of Systems See HPI  Past Medical History  Diagnosis Date  . Allergy     allergic rhinitis  . Anemia     NOS  . Depression   . GERD (gastroesophageal reflux disease)   . Hyperlipidemia   . Thyroid disease     hypothyroidism  . Arthritis     osteoarthritis  . Hx of colonic polyps   . History of diverticulitis of colon   . Headache   . Urinary incontinence   . Migraine   . Fibromyalgia   . Miscarriage     History   Social History  . Marital Status: Divorced    Spouse Name: N/A    Number of Children: N/A  . Years of Education: N/A   Occupational History  . unemployed    Social History Main Topics  . Smoking status: Never Smoker   . Smokeless tobacco: Never Used  . Alcohol Use: Yes  . Drug Use: Not on file  . Sexually Active: Not on file   Other Topics Concern  . Not on file   Social History Narrative  . No narrative on file    Past Surgical History  Procedure Date  . Cholecystectomy   . Abdominal hysterectomy   . Tonsillectomy     Family History  Problem Relation Age of Onset  . Cancer Other     female, brain,  lung  . Heart disease Other   . Stroke Other   . Diabetes Other   . Arthritis Other   . Hyperlipidemia Other   . Hypertension Other     Allergies  Allergen Reactions  . Adhesive (Tape)     blisters  . Penicillins     REACTION: rash  . Promethazine Hcl     Current Outpatient Prescriptions on File Prior to Visit  Medication Sig Dispense Refill  . betamethasone dipropionate (DIPROLENE) 0.05 % ointment as needed.      . clonazePAM (KLONOPIN) 2 MG tablet Take 2 mg by mouth at bedtime as needed.       . DULoxetine (CYMBALTA) 60 MG capsule Take 60 mg by mouth daily.        Marland Kitchen estradiol (ESTRACE) 0.5 MG tablet Take 0.5 mg by mouth daily.      Marland Kitchen gabapentin (NEURONTIN) 300 MG capsule Take 2 capsules three times a day      . LORazepam (ATIVAN) 2 MG tablet Per pt, for oral surgery on 02/02/12.      Marland Kitchen mirtazapine (REMERON) 15 MG tablet Take  1 tablet by mouth at bedtime.      . mometasone-formoterol (DULERA) 100-5 MCG/ACT AERO Inhale 2 puffs into the lungs 2 (two) times daily.  1 Inhaler  1  . NUCYNTA 100 MG TABS Take by mouth as needed.      . topiramate (TOPAMAX) 25 MG tablet TAKE 1 TABLET (25 MG TOTAL) BY MOUTH 2 (TWO) TIMES DAILY.  60 tablet  2  . carisoprodol (SOMA) 350 MG tablet Take 350 mg by mouth daily as needed.        . cyclobenzaprine (FLEXERIL) 10 MG tablet Take 1 tablet by mouth at bedtime as needed. Alternate with SOMA.      Marland Kitchen HYDROcodone-acetaminophen (NORCO) 5-325 MG per tablet Take 1 tablet by mouth as needed.      . loratadine (CLARITIN) 10 MG tablet Take 10 mg by mouth 2 (two) times daily as needed.      Marland Kitchen omeprazole (PRILOSEC) 40 MG capsule Take 1 capsule (40 mg total) by mouth daily.  30 capsule  3  . thyroid (ARMOUR THYROID) 15 MG tablet Take 1 tablet (15 mg total) by mouth daily.  30 tablet  2    BP 110/78  Pulse 78  Temp 97.8 F (36.6 C) (Oral)  Resp 16  Ht 5\' 7"  (1.702 m)  Wt 212 lb (96.163 kg)  BMI 33.20 kg/m2  SpO2 98%  LMP 11/14/1978       Objective:     Physical Exam  Constitutional: She appears well-developed and well-nourished.  HENT:  Head: Normocephalic and atraumatic.  Cardiovascular: Normal rate and regular rhythm.   No murmur heard. Pulmonary/Chest: Effort normal and breath sounds normal. No respiratory distress. She has no wheezes. She has no rales. She exhibits no tenderness.  Psychiatric: Thought content normal. Her speech is not rapid and/or pressured. She is not agitated. Cognition and memory are normal.       Somewhat flat affect.  Speech somewhat rambling.  abd:  Soft- firm fullness noted above symphysis pubis.          Assessment & Plan:

## 2012-05-04 NOTE — Assessment & Plan Note (Signed)
Need to check TSH.  I suspect that her other vague complaints/ nausea etc could be related to her non-compliance with thyroid medication.

## 2012-05-04 NOTE — Patient Instructions (Addendum)
Please complete your blood work prior to leaving. Schedule ultrasound on the first floor. Call if nausea worsens or does not improve.

## 2012-05-04 NOTE — Assessment & Plan Note (Signed)
Actually improved with dulera.  She is pleased with dulera- though notes it is expensive.  We discussed that it is minimally absorbed systemically.  At this point she is agreeable to continue.

## 2012-05-07 ENCOUNTER — Other Ambulatory Visit (HOSPITAL_BASED_OUTPATIENT_CLINIC_OR_DEPARTMENT_OTHER): Payer: Medicare Other

## 2012-05-07 DIAGNOSIS — M545 Low back pain: Secondary | ICD-10-CM | POA: Diagnosis not present

## 2012-05-07 DIAGNOSIS — F411 Generalized anxiety disorder: Secondary | ICD-10-CM | POA: Diagnosis not present

## 2012-05-07 DIAGNOSIS — F331 Major depressive disorder, recurrent, moderate: Secondary | ICD-10-CM | POA: Diagnosis not present

## 2012-05-07 DIAGNOSIS — IMO0001 Reserved for inherently not codable concepts without codable children: Secondary | ICD-10-CM | POA: Diagnosis not present

## 2012-05-14 ENCOUNTER — Other Ambulatory Visit (HOSPITAL_BASED_OUTPATIENT_CLINIC_OR_DEPARTMENT_OTHER): Payer: Medicare Other

## 2012-05-15 ENCOUNTER — Telehealth: Payer: Self-pay | Admitting: Family

## 2012-05-15 NOTE — Telephone Encounter (Signed)
Please call pt and remind her to complete blood work ordered on 6/21.

## 2012-05-15 NOTE — Telephone Encounter (Signed)
Notified pt, she states she has not been able to return due to taking care of her sick mother.  Reports that she will return but does not know an exact date at this time; "will try to do it sometime soon".

## 2012-05-15 NOTE — Telephone Encounter (Signed)
Left message on home # for pt to return my call. 

## 2012-05-18 ENCOUNTER — Telehealth: Payer: Self-pay | Admitting: Family

## 2012-05-18 DIAGNOSIS — E785 Hyperlipidemia, unspecified: Secondary | ICD-10-CM | POA: Diagnosis not present

## 2012-05-18 DIAGNOSIS — E039 Hypothyroidism, unspecified: Secondary | ICD-10-CM | POA: Diagnosis not present

## 2012-05-18 DIAGNOSIS — R110 Nausea: Secondary | ICD-10-CM | POA: Diagnosis not present

## 2012-05-18 LAB — LIPASE: Lipase: 16 U/L (ref 0–75)

## 2012-05-18 LAB — HEPATIC FUNCTION PANEL
ALT: 12 U/L (ref 0–35)
Total Protein: 6.7 g/dL (ref 6.0–8.3)

## 2012-05-18 NOTE — Telephone Encounter (Signed)
Message copied by Sandford Craze on Fri May 18, 2012 10:35 AM ------      Message from: Kayla [name]      Created: Fri May 18, 2012  9:46 AM       Order for U/S  Pelvis ,  Patient has cancelled x's 2    June 24 and July 1

## 2012-05-22 ENCOUNTER — Encounter: Payer: Self-pay | Admitting: Family

## 2012-06-22 ENCOUNTER — Telehealth: Payer: Self-pay | Admitting: Family

## 2012-06-22 MED ORDER — TOPIRAMATE 25 MG PO TABS: 25.0000 mg | ORAL_TABLET | Freq: Two times a day (BID) | ORAL | Status: DC

## 2012-06-22 NOTE — Telephone Encounter (Signed)
Refill- topiramate 25mg  tablet. Take one tablet(25mg  total) by mouth two times daily. Qty 60 last fill 6.26.13

## 2012-06-22 NOTE — Telephone Encounter (Signed)
#  60 x 2 refills sent to [facility].

## 2012-06-28 DIAGNOSIS — F331 Major depressive disorder, recurrent, moderate: Secondary | ICD-10-CM | POA: Diagnosis not present

## 2012-06-28 DIAGNOSIS — F411 Generalized anxiety disorder: Secondary | ICD-10-CM | POA: Diagnosis not present

## 2012-07-03 DIAGNOSIS — IMO0001 Reserved for inherently not codable concepts without codable children: Secondary | ICD-10-CM | POA: Diagnosis not present

## 2012-07-04 DIAGNOSIS — Z79899 Other long term (current) drug therapy: Secondary | ICD-10-CM | POA: Diagnosis not present

## 2012-07-04 DIAGNOSIS — Z5181 Encounter for therapeutic drug level monitoring: Secondary | ICD-10-CM | POA: Diagnosis not present

## 2012-08-29 DIAGNOSIS — M545 Low back pain: Secondary | ICD-10-CM | POA: Diagnosis not present

## 2012-08-29 DIAGNOSIS — IMO0001 Reserved for inherently not codable concepts without codable children: Secondary | ICD-10-CM | POA: Diagnosis not present

## 2012-08-29 DIAGNOSIS — M6250 Muscle wasting and atrophy, not elsewhere classified, unspecified site: Secondary | ICD-10-CM | POA: Diagnosis not present

## 2012-10-21 ENCOUNTER — Other Ambulatory Visit: Payer: Self-pay | Admitting: Family

## 2012-10-22 NOTE — Telephone Encounter (Signed)
Topiramate refill sent to pharmacy, #60 x 2 refills. Pt has no future appts on file and was last seen by [place] in 04/2012.  Please advise when you would like to see pt in the office again?

## 2012-10-22 NOTE — Telephone Encounter (Signed)
Pt is due for follow up please.  

## 2012-10-23 NOTE — Telephone Encounter (Signed)
Please call pt to arrange appt. 

## 2012-10-24 NOTE — Telephone Encounter (Signed)
Left message for patient to return my call.

## 2012-10-24 NOTE — Telephone Encounter (Signed)
Informed patient of medication refill and that she needs to schedule an appointment. Patient states that she will have to call back to schedule.

## 2012-12-21 ENCOUNTER — Telehealth: Payer: Self-pay | Admitting: Family

## 2012-12-21 NOTE — Telephone Encounter (Signed)
Please advise re: request below. 

## 2012-12-21 NOTE — Telephone Encounter (Signed)
Refill- ondansetron hcl 4mg  tablet. Take one tablet( 4mg  total) by mouth every eight hours as needed for nausea. Qty 10 last fill 5.30.13

## 2012-12-22 MED ORDER — ONDANSETRON HCL 4 MG PO TABS: 4.0000 mg | ORAL_TABLET | Freq: Three times a day (TID) | ORAL | Status: DC | PRN

## 2012-12-24 DIAGNOSIS — IMO0001 Reserved for inherently not codable concepts without codable children: Secondary | ICD-10-CM | POA: Diagnosis not present

## 2012-12-24 DIAGNOSIS — M6250 Muscle wasting and atrophy, not elsewhere classified, unspecified site: Secondary | ICD-10-CM | POA: Diagnosis not present

## 2012-12-24 DIAGNOSIS — M545 Low back pain: Secondary | ICD-10-CM | POA: Diagnosis not present

## 2012-12-26 ENCOUNTER — Ambulatory Visit: Payer: Medicare Other | Admitting: Family

## 2013-01-01 ENCOUNTER — Ambulatory Visit: Payer: Medicare Other | Admitting: Family

## 2013-01-03 DIAGNOSIS — F331 Major depressive disorder, recurrent, moderate: Secondary | ICD-10-CM | POA: Diagnosis not present

## 2013-01-07 ENCOUNTER — Telehealth: Payer: Self-pay | Admitting: Family

## 2013-01-07 NOTE — Telephone Encounter (Signed)
OK with me.

## 2013-01-07 NOTE — Telephone Encounter (Signed)
Patient would like to start seeing Dr. Abner Greenspan. Is this okay? Thanks!

## 2013-01-08 ENCOUNTER — Ambulatory Visit: Payer: Medicare Other | Admitting: Family

## 2013-01-08 NOTE — Telephone Encounter (Signed)
Patient scheduled appointment with Dr. Abner Greenspan for the end of march

## 2013-02-04 ENCOUNTER — Ambulatory Visit: Payer: Medicare Other | Admitting: Family Medicine

## 2013-03-12 ENCOUNTER — Ambulatory Visit: Payer: Medicare Other | Admitting: Family Medicine

## 2013-03-12 ENCOUNTER — Telehealth: Payer: Self-pay | Admitting: *Deleted

## 2013-03-12 MED ORDER — TOPIRAMATE 25 MG PO TABS: ORAL_TABLET | ORAL | Status: DC

## 2013-03-12 NOTE — Telephone Encounter (Signed)
Refill sent for 30 day supply x no refills. Left detailed message on home # and to call if any questions.

## 2013-03-12 NOTE — Telephone Encounter (Signed)
Can send 1 month supply. She is overdue for follow up.  I recommend that she move her appointment up sooner with Dr. Abner Greenspan.

## 2013-03-12 NOTE — Telephone Encounter (Signed)
Received message from pt stating she has been out of topiramate x 2 weeks. Reports that she is so sick with her headache that she is not able to drive herself in for an appt. Pt requests refill until she can see Dr Abner Greenspan in June?  Please advise.

## 2013-04-04 DIAGNOSIS — F331 Major depressive disorder, recurrent, moderate: Secondary | ICD-10-CM | POA: Diagnosis not present

## 2013-04-04 DIAGNOSIS — F411 Generalized anxiety disorder: Secondary | ICD-10-CM | POA: Diagnosis not present

## 2013-04-12 DIAGNOSIS — IMO0001 Reserved for inherently not codable concepts without codable children: Secondary | ICD-10-CM | POA: Diagnosis not present

## 2013-04-12 DIAGNOSIS — R209 Unspecified disturbances of skin sensation: Secondary | ICD-10-CM | POA: Diagnosis not present

## 2013-04-12 DIAGNOSIS — M62838 Other muscle spasm: Secondary | ICD-10-CM | POA: Diagnosis not present

## 2013-04-12 DIAGNOSIS — G4700 Insomnia, unspecified: Secondary | ICD-10-CM | POA: Diagnosis not present

## 2013-04-12 DIAGNOSIS — E559 Vitamin D deficiency, unspecified: Secondary | ICD-10-CM | POA: Diagnosis not present

## 2013-04-15 ENCOUNTER — Ambulatory Visit: Payer: Medicare Other | Admitting: Family Medicine

## 2013-05-08 DIAGNOSIS — F339 Major depressive disorder, recurrent, unspecified: Secondary | ICD-10-CM | POA: Diagnosis not present

## 2013-05-08 DIAGNOSIS — H9209 Otalgia, unspecified ear: Secondary | ICD-10-CM | POA: Diagnosis not present

## 2013-05-08 DIAGNOSIS — F331 Major depressive disorder, recurrent, moderate: Secondary | ICD-10-CM | POA: Diagnosis not present

## 2013-05-08 DIAGNOSIS — M6250 Muscle wasting and atrophy, not elsewhere classified, unspecified site: Secondary | ICD-10-CM | POA: Diagnosis not present

## 2013-05-08 DIAGNOSIS — R51 Headache: Secondary | ICD-10-CM | POA: Diagnosis not present

## 2013-05-08 DIAGNOSIS — G4700 Insomnia, unspecified: Secondary | ICD-10-CM | POA: Diagnosis not present

## 2013-05-08 DIAGNOSIS — E039 Hypothyroidism, unspecified: Secondary | ICD-10-CM | POA: Diagnosis not present

## 2013-05-08 DIAGNOSIS — M545 Low back pain: Secondary | ICD-10-CM | POA: Diagnosis not present

## 2013-05-08 DIAGNOSIS — IMO0001 Reserved for inherently not codable concepts without codable children: Secondary | ICD-10-CM | POA: Diagnosis not present

## 2013-05-14 ENCOUNTER — Ambulatory Visit (INDEPENDENT_AMBULATORY_CARE_PROVIDER_SITE_OTHER): Payer: Medicare Other | Admitting: Family Medicine

## 2013-05-14 ENCOUNTER — Encounter: Payer: Self-pay | Admitting: Family Medicine

## 2013-05-14 VITALS — BP 130/70 | HR 83 | Temp 98.2°F | Ht 67.0 in | Wt 207.0 lb

## 2013-05-14 DIAGNOSIS — E039 Hypothyroidism, unspecified: Secondary | ICD-10-CM | POA: Diagnosis not present

## 2013-05-14 DIAGNOSIS — T7840XA Allergy, unspecified, initial encounter: Secondary | ICD-10-CM | POA: Diagnosis not present

## 2013-05-14 DIAGNOSIS — F341 Dysthymic disorder: Secondary | ICD-10-CM | POA: Diagnosis not present

## 2013-05-14 DIAGNOSIS — G4700 Insomnia, unspecified: Secondary | ICD-10-CM | POA: Diagnosis not present

## 2013-05-14 DIAGNOSIS — F329 Major depressive disorder, single episode, unspecified: Secondary | ICD-10-CM

## 2013-05-14 DIAGNOSIS — F32A Depression, unspecified: Secondary | ICD-10-CM

## 2013-05-14 DIAGNOSIS — F418 Other specified anxiety disorders: Secondary | ICD-10-CM

## 2013-05-14 DIAGNOSIS — G894 Chronic pain syndrome: Secondary | ICD-10-CM

## 2013-05-14 DIAGNOSIS — E785 Hyperlipidemia, unspecified: Secondary | ICD-10-CM

## 2013-05-14 MED ORDER — CETIRIZINE HCL 10 MG PO TABS: 10.0000 mg | ORAL_TABLET | Freq: Every evening | ORAL | Status: DC | PRN

## 2013-05-14 MED ORDER — FLUTICASONE PROPIONATE 50 MCG/ACT NA SUSP: 2.0000 | Freq: Every day | NASAL | Status: DC

## 2013-05-14 MED ORDER — VENLAFAXINE HCL 50 MG PO TABS: ORAL_TABLET | ORAL | Status: DC

## 2013-05-14 MED ORDER — CLONAZEPAM 2 MG PO TABS: 2.0000 mg | ORAL_TABLET | Freq: Two times a day (BID) | ORAL | Status: DC | PRN

## 2013-05-14 NOTE — Progress Notes (Signed)
Patient ID: Kayla [name], female   DOB: [birth year], 67 y.o.   MRN: [id number] HARVEY LINGO [id number] Oct 13, 1946 05/14/2013      Progress Note-Follow Up  Subjective  Chief Complaint  Chief Complaint  Patient presents with  . Establish Care    transfer from [facility]    HPI  Patient is a 67 year old Caucasian female in today for followup. She's previously been seen in this office by another practitioner and he temporarily switched her care to [facility] but would like to reestablish care here in our office. No acute complaints but unfortunately has many chronic ongoing complaints. Has chronic back pain and diffuse fibromyalgia pain. Struggles with severe depression anxiety secondary to being cared for her aunt with advanced dementia. No other new complaints such as recent illness, chest pain, palpitations or shortness of breath.  Past Medical History  Diagnosis Date  . Allergy     allergic rhinitis  . Anemia     NOS  . Depression   . GERD (gastroesophageal reflux disease)   . Hyperlipidemia   . Thyroid disease     hypothyroidism  . Arthritis     osteoarthritis  . Hx of colonic polyps   . History of diverticulitis of colon   . Headache(784.0)   . Urinary incontinence   . Migraine   . Fibromyalgia   . Miscarriage   . Depression with anxiety 01/19/2009    Qualifier: Diagnosis of  By: Andrey Campanile MD, Raliegh Ip      Past Surgical History  Procedure Laterality Date  . Cholecystectomy    . Abdominal hysterectomy    . Tonsillectomy      Family History  Problem Relation Age of Onset  . Cancer Other     female, brain, lung  . Heart disease Other   . Stroke Other   . Diabetes Other   . Arthritis Other   . Hyperlipidemia Other   . Hypertension Other   . Alzheimer's disease Mother   . Alzheimer's disease Father     History   Social History  . Marital Status: Divorced    Spouse Name: N/A    Number of Children: N/A  . Years of Education: N/A   Occupational History  . unemployed     Social History Main Topics  . Smoking status: Never Smoker   . Smokeless tobacco: Never Used  . Alcohol Use: Yes  . Drug Use: Not on file  . Sexually Active: Not on file   Other Topics Concern  . Not on file   Social History Narrative  . No narrative on file    Current Outpatient Prescriptions on File Prior to Visit  Medication Sig Dispense Refill  . betamethasone dipropionate (DIPROLENE) 0.05 % ointment as needed.      . cyclobenzaprine (FLEXERIL) 10 MG tablet Take 1 tablet by mouth at bedtime as needed. Alternate with SOMA.      Marland Kitchen estradiol (ESTRACE) 0.5 MG tablet Take 0.5 mg by mouth daily.      Marland Kitchen gabapentin (NEURONTIN) 300 MG capsule 900 mg daily. Take 2 capsules three times a day      . loratadine (CLARITIN) 10 MG tablet Take 10 mg by mouth 2 (two) times daily as needed.      . mirtazapine (REMERON) 15 MG tablet Take 1 tablet by mouth at bedtime.      . mometasone-formoterol (DULERA) 100-5 MCG/ACT AERO Inhale 2 puffs into the lungs 2 (two) times daily.  1 Inhaler  1  .  NUCYNTA 100 MG TABS Take by mouth as needed.      Marland Kitchen omeprazole (PRILOSEC) 40 MG capsule Take 1 capsule (40 mg total) by mouth daily.  30 capsule  3  . ondansetron (ZOFRAN) 4 MG tablet Take 1 tablet (4 mg total) by mouth every 8 (eight) hours as needed for nausea.  30 tablet  0  . topiramate (TOPAMAX) 25 MG tablet TAKE 1 TABLET BY MOUTH TWICE A DAY  60 tablet  0  . carisoprodol (SOMA) 350 MG tablet Take 350 mg by mouth daily as needed.         No current facility-administered medications on file prior to visit.    Allergies  Allergen Reactions  . Adhesive (Tape)     blisters  . Penicillins     REACTION: rash  . Promethazine Hcl     Review of Systems  Review of Systems  Constitutional: Positive for malaise/fatigue. Negative for fever.  HENT: Negative for congestion.   Eyes: Negative for pain and discharge.  Respiratory: Negative for shortness of breath.   Cardiovascular: Negative for chest pain,  palpitations and leg swelling.  Gastrointestinal: Negative for nausea, abdominal pain and diarrhea.  Genitourinary: Negative for dysuria.  Musculoskeletal: Positive for myalgias and back pain. Negative for falls.  Skin: Negative for rash.  Neurological: Negative for loss of consciousness and headaches.  Endo/Heme/Allergies: Negative for polydipsia.  Psychiatric/Behavioral: Positive for depression. Negative for suicidal ideas. The patient is nervous/anxious. The patient does not have insomnia.     Objective  BP 130/70  Pulse 83  Temp(Src) 98.2 F (36.8 C) (Oral)  Ht 5\' 7"  (1.702 m)  Wt 207 lb (93.895 kg)  BMI 32.41 kg/m2  SpO2 93%  LMP 11/14/1978  Physical Exam  Physical Exam  Constitutional: She is oriented to person, place, and time and well-developed, well-nourished, and in no distress. No distress.  HENT:  Head: Normocephalic and atraumatic.  Eyes: Conjunctivae are normal.  Neck: Neck supple. No thyromegaly present.  Cardiovascular: Normal rate, regular rhythm and normal heart sounds.   No murmur heard. Pulmonary/Chest: Effort normal and breath sounds normal. She has no wheezes.  Abdominal: She exhibits no distension and no mass.  Musculoskeletal: She exhibits no edema.  Lymphadenopathy:    She has no cervical adenopathy.  Neurological: She is alert and oriented to person, place, and time.  Skin: Skin is warm and dry. No rash noted. She is not diaphoretic.  Psychiatric: Memory, affect and judgment normal.    Lab Results  Component Value Date   TSH 0.763 05/18/2012   Lab Results  Component Value Date   WBC 8.4 08/31/2010   HGB 13.9 08/31/2010   HCT 43.0 08/31/2010   MCV 86.9 08/31/2010   PLT 297 08/31/2010   Lab Results  Component Value Date   CREATININE 0.84 10/04/2011   BUN 15 10/04/2011   NA 142 10/04/2011   K 5.3 10/04/2011   CL 103 10/04/2011   CO2 26 10/04/2011   Lab Results  Component Value Date   ALT 12 05/18/2012   AST 14 05/18/2012   ALKPHOS 57  05/18/2012   BILITOT 0.4 05/18/2012    Assessment & Plan  HYPOTHYROIDISM Was contemplating switching her care to the [facility] system and had lab work there last week will request copies, patient unsure what her current dose of Armour Thyroid, will let us know.  HYPERLIPIDEMIA Request labs, no changes  Depression with anxiety Patient is the primary care giver for her 58 year old  M aunt with dementia and over the past couple of years has cared for her mother and father in past couple of years who have both passed away from dementia. Is not getting adequate results from Prozac will try switching to Venlafaxine 50 mg po bid  Chronic pain syndrome On Nucynta

## 2013-05-14 NOTE — Patient Instructions (Addendum)
Rel of rec [facility], recent labs at [address] Dr Carolyne Fiscal  Insomnia Insomnia is frequent trouble falling and/or staying asleep. Insomnia can be a long term problem or a short term problem. Both are common. Insomnia can be a short term problem when the wakefulness is related to a certain stress or worry. Long term insomnia is often related to ongoing stress during waking hours and/or poor sleeping habits. Overtime, sleep deprivation itself can make the problem worse. Every little thing feels more severe because you are overtired and your ability to cope is decreased. CAUSES   Stress, anxiety, and depression.  Poor sleeping habits.  Distractions such as TV in the bedroom.  Naps close to bedtime.  Engaging in emotionally charged conversations before bed.  Technical reading before sleep.  Alcohol and other sedatives. They may make the problem worse. They can hurt normal sleep patterns and normal dream activity.  Stimulants such as caffeine for several hours prior to bedtime.  Pain syndromes and shortness of breath can cause insomnia.  Exercise late at night.  Changing time zones may cause sleeping problems (jet lag). It is sometimes helpful to have someone observe your sleeping patterns. They should look for periods of not breathing during the night (sleep apnea). They should also look to see how long those periods last. If you live alone or observers are uncertain, you can also be observed at a sleep clinic where your sleep patterns will be professionally monitored. Sleep apnea requires a checkup and treatment. Give your caregivers your medical history. Give your caregivers observations your family has made about your sleep.  SYMPTOMS   Not feeling rested in the morning.  Anxiety and restlessness at bedtime.  Difficulty falling and staying asleep. TREATMENT   Your caregiver may prescribe treatment for an underlying medical disorders. Your caregiver can give advice or help if you are  using alcohol or other drugs for self-medication. Treatment of underlying problems will usually eliminate insomnia problems.  Medications can be prescribed for short time use. They are generally not recommended for lengthy use.  Over-the-counter sleep medicines are not recommended for lengthy use. They can be habit forming.  You can promote easier sleeping by making lifestyle changes such as:  Using relaxation techniques that help with breathing and reduce muscle tension.  Exercising earlier in the day.  Changing your diet and the time of your last meal. No night time snacks.  Establish a regular time to go to bed.  Counseling can help with stressful problems and worry.  Soothing music and white noise may be helpful if there are background noises you cannot remove.  Stop tedious detailed work at least one hour before bedtime. HOME CARE INSTRUCTIONS   Keep a diary. Inform your caregiver about your progress. This includes any medication side effects. See your caregiver regularly. Take note of:  Times when you are asleep.  Times when you are awake during the night.  The quality of your sleep.  How you feel the next day. This information will help your caregiver care for you.  Get out of bed if you are still awake after 15 minutes. Read or do some quiet activity. Keep the lights down. Wait until you feel sleepy and go back to bed.  Keep regular sleeping and waking hours. Avoid naps.  Exercise regularly.  Avoid distractions at bedtime. Distractions include watching television or engaging in any intense or detailed activity like attempting to balance the household checkbook.  Develop a bedtime ritual. Keep a familiar  routine of bathing, brushing your teeth, climbing into bed at the same time each night, listening to soothing music. Routines increase the success of falling to sleep faster.  Use relaxation techniques. This can be using breathing and muscle tension release  routines. It can also include visualizing peaceful scenes. You can also help control troubling or intruding thoughts by keeping your mind occupied with boring or repetitive thoughts like the old concept of counting sheep. You can make it more creative like imagining planting one beautiful flower after another in your backyard garden.  During your day, work to eliminate stress. When this is not possible use some of the previous suggestions to help reduce the anxiety that accompanies stressful situations. MAKE SURE YOU:   Understand these instructions.  Will watch your condition.  Will get help right away if you are not doing well or get worse. Document Released: 10/28/2000 Document Revised: 01/23/2012 Document Reviewed: 11/28/2007 [facility] Patient Information 2014 [place], [place].

## 2013-05-14 NOTE — Assessment & Plan Note (Signed)
On Nucynta

## 2013-05-14 NOTE — Assessment & Plan Note (Signed)
Patient is the primary care giver for her 66 year old M aunt with dementia and over the past couple of years has cared for her mother and father in past couple of years who have both passed away from dementia. Is not getting adequate results from Prozac will try switching to Venlafaxine 50 mg po bid

## 2013-05-14 NOTE — Assessment & Plan Note (Addendum)
Was contemplating switching her care to the [facility] system and had lab work there last week will request copies, patient unsure what her current dose of Armour Thyroid, will let us know.

## 2013-05-14 NOTE — Assessment & Plan Note (Signed)
Request labs, no changes

## 2013-05-15 ENCOUNTER — Telehealth: Payer: Self-pay | Admitting: Family Medicine

## 2013-05-15 NOTE — Telephone Encounter (Signed)
Received medical records from [facility]-Dr. Spry  P: [phone] F: [phone]

## 2013-05-30 ENCOUNTER — Other Ambulatory Visit: Payer: Self-pay | Admitting: Family

## 2013-06-13 ENCOUNTER — Encounter: Payer: Self-pay | Admitting: Family Medicine

## 2013-06-13 ENCOUNTER — Ambulatory Visit (INDEPENDENT_AMBULATORY_CARE_PROVIDER_SITE_OTHER): Payer: Medicare Other | Admitting: Family Medicine

## 2013-06-13 VITALS — BP 112/80 | HR 94 | Temp 98.4°F | Ht 67.0 in | Wt 208.1 lb

## 2013-06-13 DIAGNOSIS — F341 Dysthymic disorder: Secondary | ICD-10-CM

## 2013-06-13 DIAGNOSIS — E039 Hypothyroidism, unspecified: Secondary | ICD-10-CM | POA: Diagnosis not present

## 2013-06-13 DIAGNOSIS — D649 Anemia, unspecified: Secondary | ICD-10-CM

## 2013-06-13 DIAGNOSIS — E785 Hyperlipidemia, unspecified: Secondary | ICD-10-CM | POA: Diagnosis not present

## 2013-06-13 DIAGNOSIS — F418 Other specified anxiety disorders: Secondary | ICD-10-CM

## 2013-06-13 DIAGNOSIS — F419 Anxiety disorder, unspecified: Secondary | ICD-10-CM

## 2013-06-13 MED ORDER — CLONAZEPAM 1 MG PO TABS: 1.0000 mg | ORAL_TABLET | Freq: Three times a day (TID) | ORAL | Status: DC | PRN

## 2013-06-13 MED ORDER — VENLAFAXINE HCL ER 150 MG PO CP24: 150.0000 mg | ORAL_CAPSULE | Freq: Every day | ORAL | Status: DC

## 2013-06-13 MED ORDER — PROGESTERONE MICRONIZED 200 MG PO CAPS: 200.0000 mg | ORAL_CAPSULE | Freq: Every day | ORAL | Status: DC

## 2013-06-13 NOTE — Patient Instructions (Addendum)
Stress Stress-related medical problems are becoming increasingly common. The body has a built-in physical response to stressful situations. Faced with pressure, challenge or danger, we need to react quickly. Our bodies release hormones such as cortisol and adrenaline to help do this. These hormones are part of the "fight or flight" response and affect the metabolic rate, heart rate and blood pressure, resulting in a heightened, stressed state that prepares the body for optimum performance in dealing with a stressful situation. It is likely that early man required these mechanisms to stay alive, but usually modern stresses do not call for this, and the same hormones released in today's world can damage health and reduce coping ability. CAUSES  Pressure to perform at work, at school or in sports.  Threats of physical violence.  Money worries.  Arguments.  Family conflicts.  Divorce or separation from significant other.  Bereavement.  New job or unemployment.  Changes in location.  Alcohol or drug abuse. SOMETIMES, THERE IS NO PARTICULAR REASON FOR DEVELOPING STRESS. Almost all people are at risk of being stressed at some time in their lives. It is important to know that some stress is temporary and some is long term.  Temporary stress will go away when a situation is resolved. Most people can cope with short periods of stress, and it can often be relieved by relaxing, taking a walk, chatting through issues with friends, or having a good night's sleep.  Chronic (long-term, continuous) stress is much harder to deal with. It can be psychologically and emotionally damaging. It can be harmful both for an individual and for friends and family. SYMPTOMS Everyone reacts to stress differently. There are some common effects that help us recognize it. In times of extreme stress, people may:  Shake uncontrollably.  Breathe faster and deeper than normal (hyperventilate).  Vomit.  For people  with asthma, stress can trigger an attack.  For some people, stress may trigger migraine headaches, ulcers, and body pain. PHYSICAL EFFECTS OF STRESS MAY INCLUDE:  Loss of energy.  Skin problems.  Aches and pains resulting from tense muscles, including neck ache, backache and tension headaches.  Increased pain from arthritis and other conditions.  Irregular heart beat (palpitations).  Periods of irritability or anger.  Apathy or depression.  Anxiety (feeling uptight or worrying).  Unusual behavior.  Loss of appetite.  Comfort eating.  Lack of concentration.  Loss of, or decreased, sex-drive.  Increased smoking, drinking, or recreational drug use.  For women, missed periods.  Ulcers, joint pain, and muscle pain. Post-traumatic stress is the stress caused by any serious accident, strong emotional damage, or extremely difficult or violent experience such as rape or war. Post-traumatic stress victims can experience mixtures of emotions such as fear, shame, depression, guilt or anger. It may include recurrent memories or images that may be haunting. These feelings can last for weeks, months or even years after the traumatic event that triggered them. Specialized treatment, possibly with medicines and psychological therapies, is available. If stress is causing physical symptoms, severe distress or making it difficult for you to function as normal, it is worth seeing your caregiver. It is important to remember that although stress is a usual part of life, extreme or prolonged stress can lead to other illnesses that will need treatment. It is better to visit a doctor sooner rather than later. Stress has been linked to the development of high blood pressure and heart disease, as well as insomnia and depression. There is no diagnostic test for   stress since everyone reacts to it differently. But a caregiver will be able to spot the physical symptoms, such  as:  Headaches.  Shingles.  Ulcers. Emotional distress such as intense worry, low mood or irritability should be detected when the doctor asks pertinent questions to identify any underlying problems that might be the cause. In case there are physical reasons for the symptoms, the doctor may also want to do some tests to exclude certain conditions. If you feel that you are suffering from stress, try to identify the aspects of your life that are causing it. Sometimes you may not be able to change or avoid them, but even a small change can have a positive ripple effect. A simple lifestyle change can make all the difference. STRATEGIES THAT CAN HELP DEAL WITH STRESS:  Delegating or sharing responsibilities.  Avoiding confrontations.  Learning to be more assertive.  Regular exercise.  Avoid using alcohol or street drugs to cope.  Eating a healthy, balanced diet, rich in fruit and vegetables and proteins.  Finding humor or absurdity in stressful situations.  Never taking on more than you know you can handle comfortably.  Organizing your time better to get as much done as possible.  Talking to friends or family and sharing your thoughts and fears.  Listening to music or relaxation tapes.  Tensing and then relaxing your muscles, starting at the toes and working up to the head and neck. If you think that you would benefit from help, either in identifying the things that are causing your stress or in learning techniques to help you relax, see a caregiver who is capable of helping you with this. Rather than relying on medications, it is usually better to try and identify the things in your life that are causing stress and try to deal with them. There are many techniques of managing stress including counseling, psychotherapy, aromatherapy, yoga, and exercise. Your caregiver can help you determine what is best for you. Document Released: 01/21/2003 Document Revised: 01/23/2012 Document  Reviewed: 12/18/2007 [employer] Patient Information 2014 [employer].  

## 2013-06-16 NOTE — Progress Notes (Signed)
Patient ID: Kandice Robinsons, female   DOB: 22-Dec-1945, 67 y.o.   MRN: [id number] KATHEEN ASLIN [id number] [birth year] 06/16/2013      Progress Note-Follow Up  Subjective  Chief Complaint  Chief Complaint  Patient presents with  . Follow-up    4 week    HPI  Patient is a 67 year old female who is in today for followup. She reports a good response to venlafaxine. She feels calmer. No severe depression. No suicidal ideation anxiety is better controlled. No recent illness. No headache, chest pain or palpitations, shortness of breath, GI or GU complaints at this time. Taking medications as prescribed  Past Medical History  Diagnosis Date  . Allergy     allergic rhinitis  . Anemia     NOS  . Depression   . GERD (gastroesophageal reflux disease)   . Hyperlipidemia   . Thyroid disease     hypothyroidism  . Arthritis     osteoarthritis  . Hx of colonic polyps   . History of diverticulitis of colon   . Headache(784.0)   . Urinary incontinence   . Migraine   . Fibromyalgia   . Miscarriage   . Depression with anxiety 01/19/2009    Qualifier: Diagnosis of  By: Andrey Campanile MD, Raliegh Ip      Past Surgical History  Procedure Laterality Date  . Cholecystectomy    . Abdominal hysterectomy    . Tonsillectomy      Family History  Problem Relation Age of Onset  . Cancer Other     female, brain, lung  . Heart disease Other   . Stroke Other   . Diabetes Other   . Arthritis Other   . Hyperlipidemia Other   . Hypertension Other   . Alzheimer's disease Mother   . Alzheimer's disease Father     History   Social History  . Marital Status: Divorced    Spouse Name: N/A    Number of Children: N/A  . Years of Education: N/A   Occupational History  . unemployed    Social History Main Topics  . Smoking status: Never Smoker   . Smokeless tobacco: Never Used  . Alcohol Use: Yes  . Drug Use: Not on file  . Sexually Active: Not on file   Other Topics Concern  . Not on file   Social  History Narrative  . No narrative on file    Current Outpatient Prescriptions on File Prior to Visit  Medication Sig Dispense Refill  . betamethasone dipropionate (DIPROLENE) 0.05 % ointment as needed.      . cetirizine (ZYRTEC) 10 MG tablet Take 1 tablet (10 mg total) by mouth at bedtime as needed for allergies or rhinitis.  30 tablet  3  . cyclobenzaprine (FLEXERIL) 10 MG tablet Take 1 tablet by mouth at bedtime as needed. Alternate with SOMA.      . DULERA 100-5 MCG/ACT AERO INHALE 2 PUFFS INTO THE LUNGS 2 (TWO) TIMES DAILY.  1 Inhaler  3  . estradiol (ESTRACE) 0.5 MG tablet Take 0.5 mg by mouth daily.      . fluticasone (FLONASE) 50 MCG/ACT nasal spray Place 2 sprays into the nose daily.  16 g  6  . gabapentin (NEURONTIN) 300 MG capsule 900 mg daily. Take 2 capsules three times a day      . mirtazapine (REMERON) 15 MG tablet Take 1 tablet by mouth at bedtime.      . NUCYNTA 100 MG TABS Take by mouth as needed.      Marland Kitchen  ondansetron (ZOFRAN) 4 MG tablet Take 1 tablet (4 mg total) by mouth every 8 (eight) hours as needed for nausea.  30 tablet  0  . topiramate (TOPAMAX) 25 MG tablet TAKE 1 TABLET BY MOUTH TWICE A DAY  60 tablet  0   No current facility-administered medications on file prior to visit.    Allergies  Allergen Reactions  . Adhesive (Tape)     blisters  . Penicillins     REACTION: rash  . Promethazine Hcl     Review of Systems  Review of Systems  Constitutional: Negative for fever and malaise/fatigue.  HENT: Negative for congestion.   Eyes: Negative for pain and discharge.  Respiratory: Negative for shortness of breath.   Cardiovascular: Negative for chest pain, palpitations and leg swelling.  Gastrointestinal: Negative for nausea, abdominal pain and diarrhea.  Genitourinary: Negative for dysuria.  Musculoskeletal: Negative for falls.  Skin: Negative for rash.  Neurological: Negative for loss of consciousness and headaches.  Endo/Heme/Allergies: Negative for  polydipsia.  Psychiatric/Behavioral: Positive for depression. Negative for suicidal ideas. The patient is nervous/anxious. The patient does not have insomnia.     Objective  BP 112/80  Pulse 94  Temp(Src) 98.4 F (36.9 C) (Oral)  Ht 5\' 7"  (1.702 m)  Wt 208 lb 1.9 oz (94.403 kg)  BMI 32.59 kg/m2  SpO2 99%  LMP 11/14/1978  Physical Exam  Physical Exam  Constitutional: She is oriented to person, place, and time and well-developed, well-nourished, and in no distress. No distress.  HENT:  Head: Normocephalic and atraumatic.  Eyes: Conjunctivae are normal.  Neck: Neck supple. No thyromegaly present.  Cardiovascular: Normal rate, regular rhythm and normal heart sounds.  Exam reveals no gallop.   No murmur heard. Pulmonary/Chest: Effort normal and breath sounds normal. She has no wheezes.  Abdominal: She exhibits no distension and no mass.  Musculoskeletal: She exhibits no edema.  Lymphadenopathy:    She has no cervical adenopathy.  Neurological: She is alert and oriented to person, place, and time.  Skin: Skin is warm and dry. No rash noted. She is not diaphoretic.  Psychiatric: Memory, affect and judgment normal.    Lab Results  Component Value Date   TSH 0.763 05/18/2012   Lab Results  Component Value Date   WBC 8.4 08/31/2010   HGB 13.9 08/31/2010   HCT 43.0 08/31/2010   MCV 86.9 08/31/2010   PLT 297 08/31/2010   Lab Results  Component Value Date   CREATININE 0.84 10/04/2011   BUN 15 10/04/2011   NA 142 10/04/2011   K 5.3 10/04/2011   CL 103 10/04/2011   CO2 26 10/04/2011   Lab Results  Component Value Date   ALT 12 05/18/2012   AST 14 05/18/2012   ALKPHOS 57 05/18/2012   BILITOT 0.4 05/18/2012     Assessment & Plan  Depression with anxiety Improved on Effexor XR 150 mg daily, continue the same  HYPOTHYROIDISM TSH WNL  HYPERLIPIDEMIA Avoid trans fats, increase exercise, krill oil caps  ANEMIA-NOS Resolved, no changes

## 2013-06-16 NOTE — Assessment & Plan Note (Signed)
Resolved, no changes 

## 2013-06-16 NOTE — Assessment & Plan Note (Signed)
TSH WNL

## 2013-06-16 NOTE — Assessment & Plan Note (Signed)
Improved on Effexor XR 150 mg daily, continue the same

## 2013-06-16 NOTE — Assessment & Plan Note (Signed)
Avoid trans fats, increase exercise, krill oil caps. 

## 2013-06-27 DIAGNOSIS — F331 Major depressive disorder, recurrent, moderate: Secondary | ICD-10-CM | POA: Diagnosis not present

## 2013-07-04 ENCOUNTER — Other Ambulatory Visit: Payer: Self-pay | Admitting: Family Medicine

## 2013-07-04 NOTE — Telephone Encounter (Signed)
eScribe request for refill on Topamax Last filled - 04.29.14, #[id number] Last AEX - 07.31.14 Next AEX - appt scheduled 09.11.14 Please Advise/SLS

## 2013-07-05 NOTE — Telephone Encounter (Signed)
rx sent

## 2013-07-09 DIAGNOSIS — IMO0001 Reserved for inherently not codable concepts without codable children: Secondary | ICD-10-CM | POA: Diagnosis not present

## 2013-07-09 DIAGNOSIS — M6250 Muscle wasting and atrophy, not elsewhere classified, unspecified site: Secondary | ICD-10-CM | POA: Diagnosis not present

## 2013-07-09 DIAGNOSIS — M545 Low back pain: Secondary | ICD-10-CM | POA: Diagnosis not present

## 2013-07-25 ENCOUNTER — Ambulatory Visit (INDEPENDENT_AMBULATORY_CARE_PROVIDER_SITE_OTHER): Payer: Medicare Other | Admitting: Family Medicine

## 2013-07-25 ENCOUNTER — Encounter: Payer: Self-pay | Admitting: Family Medicine

## 2013-07-25 VITALS — BP 104/82 | HR 84 | Temp 98.2°F | Ht 67.0 in | Wt 212.0 lb

## 2013-07-25 DIAGNOSIS — F341 Dysthymic disorder: Secondary | ICD-10-CM | POA: Diagnosis not present

## 2013-07-25 DIAGNOSIS — E039 Hypothyroidism, unspecified: Secondary | ICD-10-CM

## 2013-07-25 DIAGNOSIS — H6593 Unspecified nonsuppurative otitis media, bilateral: Secondary | ICD-10-CM

## 2013-07-25 DIAGNOSIS — K219 Gastro-esophageal reflux disease without esophagitis: Secondary | ICD-10-CM

## 2013-07-25 DIAGNOSIS — E785 Hyperlipidemia, unspecified: Secondary | ICD-10-CM

## 2013-07-25 DIAGNOSIS — R51 Headache: Secondary | ICD-10-CM | POA: Diagnosis not present

## 2013-07-25 DIAGNOSIS — H6590 Unspecified nonsuppurative otitis media, unspecified ear: Secondary | ICD-10-CM | POA: Diagnosis not present

## 2013-07-25 DIAGNOSIS — Z23 Encounter for immunization: Secondary | ICD-10-CM

## 2013-07-25 DIAGNOSIS — F418 Other specified anxiety disorders: Secondary | ICD-10-CM

## 2013-07-25 NOTE — Progress Notes (Signed)
Patient ID: Kayla [name], female   DOB: 06-02-1946, 67 y.o.   MRN: [id number] CELINDA DETHLEFS [id number] 1946-06-05 07/25/2013      Progress Note-Follow Up  Subjective  Chief Complaint  Chief Complaint  Patient presents with  . Follow-up    6 week  . Injections    flu    HPI  Patient is a 67 year old female who is in today for followup. She continues to struggle with nasal congestion and some facial pressure and headache as well as some dullness in her ears. Recently seen by her dentist and had a panoramic x-ray performed. She reports her dentist notified her she had some persistent sinus disease. She's not had fevers or chills. No green rhinorrhea, sore throat or hearing changes. Her psychiatrist has recently restarted Cymbalta and she thinks that has been more helpful. No chest pain, palpitations, shortness of breath or GI complaints are noted.  Past Medical History  Diagnosis Date  . Allergy     allergic rhinitis  . Anemia     NOS  . Depression   . GERD (gastroesophageal reflux disease)   . Hyperlipidemia   . Thyroid disease     hypothyroidism  . Arthritis     osteoarthritis  . Hx of colonic polyps   . History of diverticulitis of colon   . Headache(784.0)   . Urinary incontinence   . Migraine   . Fibromyalgia   . Miscarriage   . Depression with anxiety 01/19/2009    Qualifier: Diagnosis of  By: Andrey Campanile MD, Raliegh Ip      Past Surgical History  Procedure Laterality Date  . Cholecystectomy    . Abdominal hysterectomy    . Tonsillectomy      Family History  Problem Relation Age of Onset  . Cancer Other     female, brain, lung  . Heart disease Other   . Stroke Other   . Diabetes Other   . Arthritis Other   . Hyperlipidemia Other   . Hypertension Other   . Alzheimer's disease Mother   . Alzheimer's disease Father     History   Social History  . Marital Status: Divorced    Spouse Name: N/A    Number of Children: N/A  . Years of Education: N/A    Occupational History  . unemployed    Social History Main Topics  . Smoking status: Never Smoker   . Smokeless tobacco: Never Used  . Alcohol Use: Yes  . Drug Use: Not on file  . Sexual Activity: Not on file   Other Topics Concern  . Not on file   Social History Narrative  . No narrative on file    Current Outpatient Prescriptions on File Prior to Visit  Medication Sig Dispense Refill  . betamethasone dipropionate (DIPROLENE) 0.05 % ointment as needed.      . cetirizine (ZYRTEC) 10 MG tablet Take 1 tablet (10 mg total) by mouth at bedtime as needed for allergies or rhinitis.  30 tablet  3  . clonazePAM (KLONOPIN) 1 MG tablet Take 1 tablet (1 mg total) by mouth 3 (three) times daily as needed for anxiety.  90 tablet  1  . cyclobenzaprine (FLEXERIL) 10 MG tablet Take 1 tablet by mouth at bedtime as needed. Alternate with SOMA.      . DULERA 100-5 MCG/ACT AERO INHALE 2 PUFFS INTO THE LUNGS 2 (TWO) TIMES DAILY.  1 Inhaler  3  . estradiol (ESTRACE) 0.5 MG tablet Take 0.5 mg by  mouth daily.      . fluticasone (FLONASE) 50 MCG/ACT nasal spray Place 2 sprays into the nose daily.  16 g  6  . gabapentin (NEURONTIN) 300 MG capsule 900 mg daily. Take 2 capsules three times a day      . mirtazapine (REMERON) 15 MG tablet Take 1 tablet by mouth at bedtime.      . NUCYNTA 100 MG TABS Take by mouth as needed.      . ondansetron (ZOFRAN) 4 MG tablet Take 1 tablet (4 mg total) by mouth every 8 (eight) hours as needed for nausea.  30 tablet  0  . progesterone (PROMETRIUM) 200 MG capsule Take 1 capsule (200 mg total) by mouth daily.      Marland Kitchen topiramate (TOPAMAX) 25 MG tablet TAKE 1 TABLET TWICE DAILY.  60 tablet  0   No current facility-administered medications on file prior to visit.    Allergies  Allergen Reactions  . Adhesive [Tape]     blisters  . Penicillins     REACTION: rash  . Promethazine Hcl     Review of Systems  Review of Systems  Constitutional: Positive for  malaise/fatigue. Negative for fever.  HENT: Negative for congestion.   Eyes: Negative for discharge.  Respiratory: Negative for shortness of breath.   Cardiovascular: Negative for chest pain, palpitations and leg swelling.  Gastrointestinal: Negative for nausea, abdominal pain and diarrhea.  Genitourinary: Negative for dysuria.  Musculoskeletal: Positive for back pain and joint pain. Negative for falls.  Skin: Negative for rash.  Neurological: Negative for loss of consciousness and headaches.  Endo/Heme/Allergies: Negative for polydipsia.  Psychiatric/Behavioral: Positive for depression. Negative for suicidal ideas. The patient is nervous/anxious. The patient does not have insomnia.     Objective  BP 104/82  Pulse 84  Temp(Src) 98.2 F (36.8 C) (Oral)  Ht 5\' 7"  (1.702 m)  Wt 212 lb (96.163 kg)  BMI 33.2 kg/m2  SpO2 96%  LMP 11/14/1978  Physical Exam  Physical Exam  Constitutional: She is oriented to person, place, and time and well-developed, well-nourished, and in no distress. No distress.  HENT:  Head: Normocephalic and atraumatic.  Eyes: Conjunctivae are normal.  Neck: Neck supple. No thyromegaly present.  Cardiovascular: Normal rate, regular rhythm and normal heart sounds.   No murmur heard. Pulmonary/Chest: Effort normal and breath sounds normal. She has no wheezes.  Abdominal: She exhibits no distension and no mass.  Musculoskeletal: She exhibits no edema.  Lymphadenopathy:    She has no cervical adenopathy.  Neurological: She is alert and oriented to person, place, and time.  Skin: Skin is warm and dry. No rash noted. She is not diaphoretic.  Psychiatric: Memory, affect and judgment normal.    Lab Results  Component Value Date   TSH 0.763 05/18/2012   Lab Results  Component Value Date   WBC 8.4 08/31/2010   HGB 13.9 08/31/2010   HCT 43.0 08/31/2010   MCV 86.9 08/31/2010   PLT 297 08/31/2010   Lab Results  Component Value Date   CREATININE 0.84  10/04/2011   BUN 15 10/04/2011   NA 142 10/04/2011   K 5.3 10/04/2011   CL 103 10/04/2011   CO2 26 10/04/2011   Lab Results  Component Value Date   ALT 12 05/18/2012   AST 14 05/18/2012   ALKPHOS 57 05/18/2012   BILITOT 0.4 05/18/2012     Assessment & Plan  HEADACHE Recently told by her dentist that she has sinus dsease and  she continues to struggle with some nasal congestion and ear pressure will refer to ENT for further consideration due to persistent symptoms  Depression with anxiety They have restarted her Cymbalta at 60 mg po bid and continues Nucynta. Feels this combo is helping.  GERD Avoid offending foods, use Zantac and Tums prn  HYPOTHYROIDISM tsh wnl on last check, will need complete check in future  HYPERLIPIDEMIA Avoid trans fats, increase activity and start a  Krill oil cap daily

## 2013-07-25 NOTE — Patient Instructions (Addendum)
Serous Otitis Media   Serous otitis media is also known as otitis media with effusion (OME). It means there is fluid in the middle ear space. This space contains the bones for hearing and air. Air in the middle ear space helps to transmit sound.   The air gets there through the eustachian tube. This tube goes from the back of the throat to the middle ear space. It keeps the pressure in the middle ear the same as the outside world. It also helps to drain fluid from the middle ear space.  CAUSES   OME occurs when the eustachian tube gets blocked. Blockage can come from:  · Ear infections.  · Colds and other upper respiratory infections.  · Allergies.  · Irritants such as cigarette smoke.  · Sudden changes in air pressure (such as descending in an airplane).  · Enlarged adenoids.  During colds and upper respiratory infections, the middle ear space can become temporarily filled with fluid. This can happen after an ear infection also. Once the infection clears, the fluid will generally drain out of the ear through the eustachian tube. If it does not, then OME occurs.  SYMPTOMS   · Hearing loss.  · A feeling of fullness in the ear  but no pain.  · Young children may not show any symptoms.  DIAGNOSIS   · Diagnosis of OME is made by an ear exam.  · Tests may be done to check on the movement of the eardrum.  · Hearing exams may be done.  TREATMENT   · The fluid most often goes away without treatment.  · If allergy is the cause, allergy treatment may be helpful.  · Fluid that persists for several months may require minor surgery. A small tube is placed in the ear drum to:  · Drain the fluid.  · Restore the air in the middle ear space.  · In certain situations, antibiotics are used to avoid surgery.  · Surgery may be done to remove enlarged adenoids (if this is the cause).  HOME CARE INSTRUCTIONS   · Keep children away from tobacco smoke.  · Be sure to keep follow up appointments, if any.  SEEK MEDICAL CARE IF:   · Hearing is  not better in 3 months.  · Hearing is worse.  · Ear pain.  · Drainage from the ear.  · Dizziness.  Document Released: 01/21/2004 Document Revised: 01/23/2012 Document Reviewed: 11/20/2008  [employer]® Patient Information ©2014 [employer].

## 2013-07-28 NOTE — Assessment & Plan Note (Signed)
Recently told by her dentist that she has sinus dsease and she continues to struggle with some nasal congestion and ear pressure will refer to ENT for further consideration due to persistent symptoms

## 2013-07-28 NOTE — Assessment & Plan Note (Signed)
Avoid trans fats, increase activity and start a  Krill oil cap daily

## 2013-07-28 NOTE — Assessment & Plan Note (Signed)
Avoid offending foods, use Zantac and Tums prn

## 2013-07-28 NOTE — Assessment & Plan Note (Signed)
They have restarted her Cymbalta at 60 mg po bid and continues Nucynta. Feels this combo is helping.

## 2013-07-28 NOTE — Assessment & Plan Note (Signed)
tsh wnl on last check, will need complete check in future

## 2013-08-12 DIAGNOSIS — F458 Other somatoform disorders: Secondary | ICD-10-CM | POA: Diagnosis not present

## 2013-08-12 DIAGNOSIS — H9209 Otalgia, unspecified ear: Secondary | ICD-10-CM | POA: Diagnosis not present

## 2013-08-12 DIAGNOSIS — H60509 Unspecified acute noninfective otitis externa, unspecified ear: Secondary | ICD-10-CM | POA: Diagnosis not present

## 2013-09-02 ENCOUNTER — Other Ambulatory Visit: Payer: Self-pay | Admitting: Family

## 2013-09-05 DIAGNOSIS — M6250 Muscle wasting and atrophy, not elsewhere classified, unspecified site: Secondary | ICD-10-CM | POA: Diagnosis not present

## 2013-09-05 DIAGNOSIS — IMO0001 Reserved for inherently not codable concepts without codable children: Secondary | ICD-10-CM | POA: Diagnosis not present

## 2013-09-05 DIAGNOSIS — M545 Low back pain: Secondary | ICD-10-CM | POA: Diagnosis not present

## 2013-10-15 DIAGNOSIS — F331 Major depressive disorder, recurrent, moderate: Secondary | ICD-10-CM | POA: Diagnosis not present

## 2013-10-15 DIAGNOSIS — F411 Generalized anxiety disorder: Secondary | ICD-10-CM | POA: Diagnosis not present

## 2013-10-25 ENCOUNTER — Encounter: Payer: Self-pay | Admitting: Family Medicine

## 2013-10-25 ENCOUNTER — Ambulatory Visit (INDEPENDENT_AMBULATORY_CARE_PROVIDER_SITE_OTHER): Payer: Medicare Other | Admitting: Family Medicine

## 2013-10-25 VITALS — BP 118/82 | HR 81 | Temp 98.1°F | Ht 67.0 in | Wt 227.0 lb

## 2013-10-25 DIAGNOSIS — F329 Major depressive disorder, single episode, unspecified: Secondary | ICD-10-CM

## 2013-10-25 DIAGNOSIS — Z23 Encounter for immunization: Secondary | ICD-10-CM

## 2013-10-25 DIAGNOSIS — F419 Anxiety disorder, unspecified: Secondary | ICD-10-CM

## 2013-10-25 DIAGNOSIS — E785 Hyperlipidemia, unspecified: Secondary | ICD-10-CM

## 2013-10-25 DIAGNOSIS — R5381 Other malaise: Secondary | ICD-10-CM | POA: Diagnosis not present

## 2013-10-25 DIAGNOSIS — R404 Transient alteration of awareness: Secondary | ICD-10-CM

## 2013-10-25 DIAGNOSIS — E079 Disorder of thyroid, unspecified: Secondary | ICD-10-CM | POA: Diagnosis not present

## 2013-10-25 DIAGNOSIS — F411 Generalized anxiety disorder: Secondary | ICD-10-CM | POA: Diagnosis not present

## 2013-10-25 DIAGNOSIS — F341 Dysthymic disorder: Secondary | ICD-10-CM

## 2013-10-25 DIAGNOSIS — Z780 Asymptomatic menopausal state: Secondary | ICD-10-CM

## 2013-10-25 DIAGNOSIS — R5383 Other fatigue: Secondary | ICD-10-CM

## 2013-10-25 DIAGNOSIS — E039 Hypothyroidism, unspecified: Secondary | ICD-10-CM

## 2013-10-25 DIAGNOSIS — E669 Obesity, unspecified: Secondary | ICD-10-CM

## 2013-10-25 LAB — CBC
HCT: 43.4 % (ref 36.0–46.0)
Hemoglobin: 14.5 g/dL (ref 12.0–15.0)
MCH: 28.4 pg (ref 26.0–34.0)
MCHC: 33.4 g/dL (ref 30.0–36.0)
MCV: 85.1 fL (ref 78.0–100.0)
Platelets: 321 K/uL (ref 150–400)
RBC: 5.1 MIL/uL (ref 3.87–5.11)
RDW: 13.6 % (ref 11.5–15.5)
WBC: 8.2 K/uL (ref 4.0–10.5)

## 2013-10-25 LAB — LIPID PANEL
Cholesterol: 271 mg/dL — ABNORMAL HIGH (ref 0–200)
LDL Cholesterol: 173 mg/dL — ABNORMAL HIGH (ref 0–99)
Total CHOL/HDL Ratio: 3.5 Ratio
Triglycerides: 102 mg/dL (ref <150)

## 2013-10-25 LAB — RENAL FUNCTION PANEL
BUN: 14 mg/dL (ref 6–23)
Calcium: 9.6 mg/dL (ref 8.4–10.5)
Creat: 0.86 mg/dL (ref 0.50–1.10)
Glucose, Bld: 85 mg/dL (ref 70–99)
Phosphorus: 4.6 mg/dL (ref 2.3–4.6)

## 2013-10-25 LAB — HEPATIC FUNCTION PANEL
ALT: 22 U/L (ref 0–35)
AST: 22 U/L (ref 0–37)
Alkaline Phosphatase: 64 U/L (ref 39–117)
Bilirubin, Direct: 0.1 mg/dL (ref 0.0–0.3)
Total Bilirubin: 0.4 mg/dL (ref 0.3–1.2)
Total Protein: 7.1 g/dL (ref 6.0–8.3)

## 2013-10-25 MED ORDER — PROGESTERONE MICRONIZED 200 MG PO CAPS: 200.0000 mg | ORAL_CAPSULE | Freq: Every day | ORAL | Status: DC

## 2013-10-25 MED ORDER — THYROID 30 MG PO TABS: 30.0000 mg | ORAL_TABLET | Freq: Every day | ORAL | Status: DC

## 2013-10-25 MED ORDER — CLONAZEPAM 1 MG PO TABS: 1.0000 mg | ORAL_TABLET | Freq: Three times a day (TID) | ORAL | Status: DC | PRN

## 2013-10-25 NOTE — Patient Instructions (Signed)

## 2013-10-25 NOTE — Progress Notes (Signed)
Pre visit review using our clinic review tool, if applicable. No additional management support is needed unless otherwise documented below in the visit note. 

## 2013-10-26 ENCOUNTER — Encounter: Payer: Self-pay | Admitting: Family Medicine

## 2013-10-26 DIAGNOSIS — E669 Obesity, unspecified: Secondary | ICD-10-CM

## 2013-10-26 HISTORY — DX: Obesity, unspecified: E66.9

## 2013-10-26 NOTE — Assessment & Plan Note (Signed)
Elevated encouraged patient to consider Atorvastatin, avoid trans fats

## 2013-10-26 NOTE — Assessment & Plan Note (Signed)
Given refill on Armour thyroid today, TSH wnl

## 2013-10-26 NOTE — Assessment & Plan Note (Signed)
Increasingly tired since bringing her elderly aunt with dementia in to live with her. Encouraged to consider wether or not she can continue this pace.

## 2013-10-26 NOTE — Assessment & Plan Note (Signed)
Encouraged heart healthy diet and increased sleep

## 2013-10-26 NOTE — Progress Notes (Signed)
Patient ID: Kayla [name], female   DOB: [birth year], 67 y.o.   MRN: [id number] TOMEEKA PLAUGHER [id number] 1946-08-14 10/26/2013      Progress Note-Follow Up  Subjective  Chief Complaint  Chief Complaint  Patient presents with  . Follow-up    3 month  . Injections    prevnar and tdap    HPI  Patient is a 67 year old who is in today for followup. She is struggling with increasing fatigue and depression secondary to bring her elderly demented and 2. She's getting very little sleep or exercise. Acknowledges stress eating. Has had weight gain and has increased fatigue and malaise. No chest pain or palpitations. No GI or GU complaints. Has persistent headache. He's cleaning her teeth and complains of hair loss. Past Medical History  Diagnosis Date  . Allergy     allergic rhinitis  . Anemia     NOS  . Depression   . GERD (gastroesophageal reflux disease)   . Hyperlipidemia   . Thyroid disease     hypothyroidism  . Arthritis     osteoarthritis  . Hx of colonic polyps   . History of diverticulitis of colon   . Headache(784.0)   . Urinary incontinence   . Migraine   . Fibromyalgia   . Miscarriage   . Depression with anxiety 01/19/2009    Qualifier: Diagnosis of  By: Andrey Campanile MD, Raliegh Ip    . Obesity, unspecified 10/26/2013    Past Surgical History  Procedure Laterality Date  . Cholecystectomy    . Abdominal hysterectomy    . Tonsillectomy      Family History  Problem Relation Age of Onset  . Cancer Other     female, brain, lung  . Heart disease Other   . Stroke Other   . Diabetes Other   . Arthritis Other   . Hyperlipidemia Other   . Hypertension Other   . Alzheimer's disease Mother   . Alzheimer's disease Father     History   Social History  . Marital Status: Divorced    Spouse Name: N/A    Number of Children: N/A  . Years of Education: N/A   Occupational History  . unemployed    Social History Main Topics  . Smoking status: Never Smoker   . Smokeless  tobacco: Never Used  . Alcohol Use: Yes  . Drug Use: Not on file  . Sexual Activity: Not on file   Other Topics Concern  . Not on file   Social History Narrative  . No narrative on file    Current Outpatient Prescriptions on File Prior to Visit  Medication Sig Dispense Refill  . betamethasone dipropionate (DIPROLENE) 0.05 % ointment as needed.      . cetirizine (ZYRTEC) 10 MG tablet Take 1 tablet (10 mg total) by mouth at bedtime as needed for allergies or rhinitis.  30 tablet  3  . cyclobenzaprine (FLEXERIL) 10 MG tablet Take 1 tablet by mouth at bedtime as needed. Alternate with SOMA.      . DULERA 100-5 MCG/ACT AERO INHALE 2 PUFFS INTO THE LUNGS 2 (TWO) TIMES DAILY.  1 Inhaler  3  . DULoxetine (CYMBALTA) 60 MG capsule Take 60 mg by mouth daily.      Marland Kitchen estradiol (ESTRACE) 0.5 MG tablet Take 0.5 mg by mouth daily.      . fluticasone (FLONASE) 50 MCG/ACT nasal spray Place 2 sprays into the nose daily.  16 g  6  . gabapentin (NEURONTIN) 300 MG  capsule 900 mg daily. Take 2 capsules three times a day      . mirtazapine (REMERON) 15 MG tablet Take 1 tablet by mouth at bedtime.      . NUCYNTA 100 MG TABS Take by mouth as needed.      . ondansetron (ZOFRAN) 4 MG tablet Take 1 tablet (4 mg total) by mouth every 8 (eight) hours as needed for nausea.  30 tablet  0  . topiramate (TOPAMAX) 25 MG tablet TAKE 1 TABLET TWICE DAILY.  60 tablet  2  . thyroid (ARMOUR THYROID) 15 MG tablet Take 1 tablet (15 mg total) by mouth daily.  30 tablet  2   No current facility-administered medications on file prior to visit.    Allergies  Allergen Reactions  . Adhesive [Tape]     blisters  . Penicillins     REACTION: rash  . Promethazine Hcl     Review of Systems  Review of Systems  Constitutional: Positive for malaise/fatigue. Negative for fever.  HENT: Negative for congestion.   Eyes: Negative for discharge.  Respiratory: Negative for shortness of breath.   Cardiovascular: Negative for chest  pain, palpitations and leg swelling.  Gastrointestinal: Negative for nausea, abdominal pain and diarrhea.  Genitourinary: Negative for dysuria.  Musculoskeletal: Negative for falls.  Skin: Negative for rash.  Neurological: Positive for headaches. Negative for loss of consciousness.  Endo/Heme/Allergies: Negative for polydipsia.  Psychiatric/Behavioral: Positive for depression. Negative for suicidal ideas. The patient is nervous/anxious. The patient does not have insomnia.     Objective  BP 118/82  Pulse 81  Temp(Src) 98.1 F (36.7 C) (Oral)  Ht 5\' 7"  (1.702 m)  Wt 227 lb (102.967 kg)  BMI 35.55 kg/m2  SpO2 97%  LMP 11/14/1978  Physical Exam  Physical Exam  Constitutional: She is oriented to person, place, and time and well-developed, well-nourished, and in no distress. No distress.  HENT:  Head: Normocephalic and atraumatic.  Eyes: Conjunctivae are normal.  Neck: Neck supple. No thyromegaly present.  Cardiovascular: Normal rate, regular rhythm and normal heart sounds.   No murmur heard. Pulmonary/Chest: Effort normal and breath sounds normal. She has no wheezes.  Abdominal: She exhibits no distension and no mass.  Musculoskeletal: She exhibits no edema.  Lymphadenopathy:    She has no cervical adenopathy.  Neurological: She is alert and oriented to person, place, and time.  Skin: Skin is warm and dry. No rash noted. She is not diaphoretic.  Psychiatric: Memory, affect and judgment normal.    Lab Results  Component Value Date   TSH 1.116 10/25/2013   Lab Results  Component Value Date   WBC 8.2 10/25/2013   HGB 14.5 10/25/2013   HCT 43.4 10/25/2013   MCV 85.1 10/25/2013   PLT 321 10/25/2013   Lab Results  Component Value Date   CREATININE 0.86 10/25/2013   BUN 14 10/25/2013   NA 139 10/25/2013   K 4.5 10/25/2013   CL 102 10/25/2013   CO2 29 10/25/2013   Lab Results  Component Value Date   ALT 22 10/25/2013   AST 22 10/25/2013   ALKPHOS 64 10/25/2013    BILITOT 0.4 10/25/2013   Lab Results  Component Value Date   CHOL 271* 10/25/2013   Lab Results  Component Value Date   HDL 78 10/25/2013   Lab Results  Component Value Date   LDLCALC 173* 10/25/2013   Lab Results  Component Value Date   TRIG 102 10/25/2013   Lab Results  Component Value Date   CHOLHDL 3.5 10/25/2013     Assessment & Plan  HYPERLIPIDEMIA Elevated encouraged patient to consider Atorvastatin, avoid trans fats  SOMNOLENCE Increasingly tired since bringing her elderly aunt with dementia in to live with her. Encouraged to consider wether or not she can continue this pace.  HYPOTHYROIDISM Given refill on Armour thyroid today, TSH wnl  Obesity, unspecified Encouraged heart healthy diet and increased sleep

## 2013-10-28 NOTE — Addendum Note (Signed)
Addended by: Court Joy on: 10/28/2013 03:02 PM   Modules accepted: Orders

## 2013-12-04 DIAGNOSIS — IMO0001 Reserved for inherently not codable concepts without codable children: Secondary | ICD-10-CM | POA: Diagnosis not present

## 2013-12-05 ENCOUNTER — Ambulatory Visit (INDEPENDENT_AMBULATORY_CARE_PROVIDER_SITE_OTHER): Payer: Medicare Other | Admitting: Family Medicine

## 2013-12-05 ENCOUNTER — Encounter: Payer: Self-pay | Admitting: Family Medicine

## 2013-12-05 VITALS — BP 118/80 | HR 88 | Temp 97.8°F | Ht 67.0 in | Wt 227.1 lb

## 2013-12-05 DIAGNOSIS — E039 Hypothyroidism, unspecified: Secondary | ICD-10-CM

## 2013-12-05 DIAGNOSIS — M7918 Myalgia, other site: Secondary | ICD-10-CM

## 2013-12-05 DIAGNOSIS — E785 Hyperlipidemia, unspecified: Secondary | ICD-10-CM

## 2013-12-05 DIAGNOSIS — J45909 Unspecified asthma, uncomplicated: Secondary | ICD-10-CM

## 2013-12-05 DIAGNOSIS — IMO0001 Reserved for inherently not codable concepts without codable children: Secondary | ICD-10-CM | POA: Diagnosis not present

## 2013-12-05 DIAGNOSIS — Z780 Asymptomatic menopausal state: Secondary | ICD-10-CM | POA: Diagnosis not present

## 2013-12-05 MED ORDER — THYROID 15 MG PO TABS: 15.0000 mg | ORAL_TABLET | Freq: Every day | ORAL | Status: DC

## 2013-12-05 MED ORDER — ATORVASTATIN CALCIUM 10 MG PO TABS: 10.0000 mg | ORAL_TABLET | ORAL | Status: DC

## 2013-12-05 NOTE — Patient Instructions (Addendum)
Curcumin (Turmeric) [email or web address] NOW coQ10 enzyme daily  Cholesterol Cholesterol is a white, waxy, fat-like protein needed by your body in small amounts. The liver makes all the cholesterol you need. It is carried from the liver by the blood through the blood vessels. Deposits (plaque) may build up on blood vessel walls. This makes the arteries narrower and stiffer. Plaque increases the risk for heart attack and stroke. You cannot feel your cholesterol level even if it is very high. The only way to know is by a blood test to check your lipid (fats) levels. Once you know your cholesterol levels, you should keep a record of the test results. Work with your caregiver to to keep your levels in the desired range. WHAT THE RESULTS MEAN:  Total cholesterol is a rough measure of all the cholesterol in your blood.  LDL is the so-called bad cholesterol. This is the type that deposits cholesterol in the walls of the arteries. You want this level to be low.  HDL is the good cholesterol because it cleans the arteries and carries the LDL away. You want this level to be high.  Triglycerides are fat that the body can either burn for energy or store. High levels are closely linked to heart disease. DESIRED LEVELS:  Total cholesterol below 200.  LDL below 100 for people at risk, below 70 for very high risk.  HDL above 50 is good, above 60 is best.  Triglycerides below 150. HOW TO LOWER YOUR CHOLESTEROL:  Diet.  Choose fish or white meat chicken and [place], roasted or baked. Limit fatty cuts of red meat, fried foods, and processed meats, such as sausage and lunch meat.  Eat lots of fresh fruits and vegetables. Choose whole grains, beans, pasta, potatoes and cereals.  Use only small amounts of olive, corn or canola oils. Avoid butter, mayonnaise, shortening or palm kernel oils. Avoid foods with trans-fats.  Use skim/nonfat milk and low-fat/nonfat yogurt and cheeses. Avoid whole milk, cream, ice  cream, egg yolks and cheeses. Healthy desserts include angel food cake, ginger snaps, animal crackers, hard candy, popsicles, and low-fat/nonfat frozen yogurt. Avoid pastries, cakes, pies and cookies.  Exercise.  A regular program helps decrease LDL and raises HDL.  Helps with weight control.  Do things that increase your activity level like gardening, walking, or taking the stairs.  Medication.  May be prescribed by your caregiver to help lowering cholesterol and the risk for heart disease.  You may need medicine even if your levels are normal if you have several risk factors. HOME CARE INSTRUCTIONS   Follow your diet and exercise programs as suggested by your caregiver.  Take medications as directed.  Have blood work done when your caregiver feels it is necessary. MAKE SURE YOU:   Understand these instructions.  Will watch your condition.  Will get help right away if you are not doing well or get worse. Document Released: 07/26/2001 Document Revised: 01/23/2012 Document Reviewed: 08/14/2013 [facility] Patient Information 2014 [place], [place].

## 2013-12-05 NOTE — Progress Notes (Signed)
Pre visit review using our clinic review tool, if applicable. No additional management support is needed unless otherwise documented below in the visit note. 

## 2013-12-08 ENCOUNTER — Other Ambulatory Visit: Payer: Self-pay | Admitting: Family

## 2013-12-09 ENCOUNTER — Encounter: Payer: Self-pay | Admitting: Family Medicine

## 2013-12-09 NOTE — Assessment & Plan Note (Signed)
Well treated on current dose of levothyroxine. No changes

## 2013-12-09 NOTE — Assessment & Plan Note (Signed)
Patient requesting refill on Estrogen and Progesterone but is having trouble getting her insurance to cover them. After long discussion patient agrees to try proceeding with out meds.

## 2013-12-09 NOTE — Assessment & Plan Note (Signed)
Doing well on Dulera 

## 2013-12-09 NOTE — Progress Notes (Signed)
Patient ID: Kayla [name], female   DOB: March 08, 1946, 68 y.o.   MRN: [id number] Kayla [name] [id number] 02-Shene-1947 12/09/2013      Progress Note-Follow Up  Subjective  Chief Complaint  Chief Complaint  Patient presents with  . Follow-up    6 week    HPI  Patient is a 68 year old Caucasian female who is in today for followup. She is off of her estrogen and progesterone do to difficulties with her insurance. She is having some mild hot flashes but is tolerable. No other acute complaints. Denies chest pain or palpitations. No shortness of breath GI or GU concerns that are new. Continues to follow with pain management. Taking medications as prescribed.  Past Medical History  Diagnosis Date  . Allergy     allergic rhinitis  . Anemia     NOS  . Depression   . GERD (gastroesophageal reflux disease)   . Hyperlipidemia   . Thyroid disease     hypothyroidism  . Arthritis     osteoarthritis  . Hx of colonic polyps   . History of diverticulitis of colon   . Headache(784.0)   . Urinary incontinence   . Migraine   . Fibromyalgia   . Miscarriage   . Depression with anxiety 01/19/2009    Qualifier: Diagnosis of  By: Redmond Pulling MD, Frann Rider    . Obesity, unspecified 10/26/2013    Past Surgical History  Procedure Laterality Date  . Cholecystectomy    . Abdominal hysterectomy    . Tonsillectomy      Family History  Problem Relation Age of Onset  . Cancer Other     female, brain, lung  . Heart disease Other   . Stroke Other   . Diabetes Other   . Arthritis Other   . Hyperlipidemia Other   . Hypertension Other   . Alzheimer's disease Mother   . Alzheimer's disease Father     History   Social History  . Marital Status: Divorced    Spouse Name: N/A    Number of Children: N/A  . Years of Education: N/A   Occupational History  . unemployed    Social History Main Topics  . Smoking status: Never Smoker   . Smokeless tobacco: Never Used  . Alcohol Use: Yes  . Drug Use: Not on  file  . Sexual Activity: Not on file   Other Topics Concern  . Not on file   Social History Narrative  . No narrative on file    Current Outpatient Prescriptions on File Prior to Visit  Medication Sig Dispense Refill  . betamethasone dipropionate (DIPROLENE) 0.05 % ointment as needed.      . clonazePAM (KLONOPIN) 1 MG tablet Take 1 tablet (1 mg total) by mouth 3 (three) times daily as needed for anxiety.  90 tablet  1  . cyclobenzaprine (FLEXERIL) 10 MG tablet Take 1 tablet by mouth at bedtime as needed. Alternate with SOMA.      . DULERA 100-5 MCG/ACT AERO INHALE 2 PUFFS INTO THE LUNGS 2 (TWO) TIMES DAILY.  1 Inhaler  3  . DULoxetine (CYMBALTA) 60 MG capsule Take 60 mg by mouth daily.      . fluticasone (FLONASE) 50 MCG/ACT nasal spray Place 2 sprays into the nose daily.  16 g  6  . gabapentin (NEURONTIN) 300 MG capsule 900 mg daily. Take 2 capsules three times a day      . mirtazapine (REMERON) 15 MG tablet Take 1 tablet by mouth  at bedtime.      . NUCYNTA 100 MG TABS Take by mouth as needed.      . ondansetron (ZOFRAN) 4 MG tablet Take 1 tablet (4 mg total) by mouth every 8 (eight) hours as needed for nausea.  30 tablet  0  . progesterone (PROMETRIUM) 200 MG capsule Take 1 capsule (200 mg total) by mouth daily.  90 capsule  1  . thyroid (ARMOUR THYROID) 30 MG tablet Take 1 tablet (30 mg total) by mouth daily before breakfast.  90 tablet  1  . estradiol (ESTRACE) 0.5 MG tablet Take 0.5 mg by mouth daily.       No current facility-administered medications on file prior to visit.    Allergies  Allergen Reactions  . Adhesive [Tape]     blisters  . Penicillins     REACTION: rash  . Promethazine Hcl     Review of Systems  Review of Systems  Constitutional: Positive for malaise/fatigue. Negative for fever.  HENT: Negative for congestion.   Eyes: Negative for discharge.  Respiratory: Negative for shortness of breath.   Cardiovascular: Negative for chest pain, palpitations and  leg swelling.  Gastrointestinal: Negative for nausea, abdominal pain and diarrhea.  Genitourinary: Negative for dysuria.  Musculoskeletal: Positive for back pain and myalgias. Negative for falls.  Skin: Negative for rash.  Neurological: Negative for loss of consciousness and headaches.  Endo/Heme/Allergies: Negative for polydipsia.  Psychiatric/Behavioral: Positive for depression. Negative for suicidal ideas. The patient has insomnia. The patient is not nervous/anxious.     Objective  BP 118/80  Pulse 88  Temp(Src) 97.8 F (36.6 C) (Oral)  Ht 5\' 7"  (1.702 m)  Wt 227 lb 1.9 oz (103.021 kg)  BMI 35.56 kg/m2  SpO2 97%  LMP 11/14/1978  Physical Exam  Physical Exam  Constitutional: She is oriented to person, place, and time and well-developed, well-nourished, and in no distress. No distress.  HENT:  Head: Normocephalic and atraumatic.  Eyes: Conjunctivae are normal.  Neck: Neck supple. No thyromegaly present.  Cardiovascular: Normal rate, regular rhythm and normal heart sounds.   No murmur heard. Pulmonary/Chest: Effort normal and breath sounds normal. She has no wheezes.  Abdominal: She exhibits no distension and no mass.  Musculoskeletal: She exhibits no edema.  Lymphadenopathy:    She has no cervical adenopathy.  Neurological: She is alert and oriented to person, place, and time.  Skin: Skin is warm and dry. No rash noted. She is not diaphoretic.  Psychiatric: Memory, affect and judgment normal.    Lab Results  Component Value Date   TSH 1.116 10/25/2013   Lab Results  Component Value Date   WBC 8.2 10/25/2013   HGB 14.5 10/25/2013   HCT 43.4 10/25/2013   MCV 85.1 10/25/2013   PLT 321 10/25/2013   Lab Results  Component Value Date   CREATININE 0.86 10/25/2013   BUN 14 10/25/2013   NA 139 10/25/2013   K 4.5 10/25/2013   CL 102 10/25/2013   CO2 29 10/25/2013   Lab Results  Component Value Date   ALT 22 10/25/2013   AST 22 10/25/2013   ALKPHOS 64  10/25/2013   BILITOT 0.4 10/25/2013   Lab Results  Component Value Date   CHOL 271* 10/25/2013   Lab Results  Component Value Date   HDL 78 10/25/2013   Lab Results  Component Value Date   LDLCALC 173* 10/25/2013   Lab Results  Component Value Date   TRIG 102 10/25/2013  Lab Results  Component Value Date   CHOLHDL 3.5 10/25/2013     Assessment & Plan  POSTMENOPAUSAL STATUS Patient requesting refill on Estrogen and Progesterone but is having trouble getting her insurance to cover them. After long discussion patient agrees to try proceeding with out meds.   HYPOTHYROIDISM Well treated on current dose of levothyroxine. No changes  HYPERLIPIDEMIA Was not taking Atorvastatin, restart, avoid trans fats.   Musculoskeletal pain Following with pain management  Asthma Doing well on [facility]

## 2013-12-09 NOTE — Assessment & Plan Note (Signed)
Was not taking Atorvastatin, restart, avoid trans fats.

## 2013-12-09 NOTE — Assessment & Plan Note (Signed)
Following with pain management

## 2013-12-27 ENCOUNTER — Encounter: Payer: Self-pay | Admitting: Family Medicine

## 2013-12-27 ENCOUNTER — Ambulatory Visit (INDEPENDENT_AMBULATORY_CARE_PROVIDER_SITE_OTHER): Payer: Medicare Other | Admitting: Family Medicine

## 2013-12-27 VITALS — BP 116/78 | HR 84 | Temp 98.3°F | Wt 227.0 lb

## 2013-12-27 DIAGNOSIS — R1013 Epigastric pain: Secondary | ICD-10-CM | POA: Diagnosis not present

## 2013-12-27 LAB — CBC WITH DIFFERENTIAL/PLATELET
BASOS ABS: 0 10*3/uL (ref 0.0–0.1)
BASOS PCT: 0 % (ref 0–1)
Eosinophils Absolute: 0.1 10*3/uL (ref 0.0–0.7)
Eosinophils Relative: 1 % (ref 0–5)
HEMATOCRIT: 41.4 % (ref 36.0–46.0)
HEMOGLOBIN: 14 g/dL (ref 12.0–15.0)
LYMPHS PCT: 29 % (ref 12–46)
Lymphs Abs: 2.6 10*3/uL (ref 0.7–4.0)
MCH: 27.9 pg (ref 26.0–34.0)
MCHC: 33.8 g/dL (ref 30.0–36.0)
MCV: 82.6 fL (ref 78.0–100.0)
MONO ABS: 0.5 10*3/uL (ref 0.1–1.0)
MONOS PCT: 6 % (ref 3–12)
NEUTROS PCT: 64 % (ref 43–77)
Neutro Abs: 5.5 10*3/uL (ref 1.7–7.7)
Platelets: 280 10*3/uL (ref 150–400)
RBC: 5.01 MIL/uL (ref 3.87–5.11)
RDW: 12.6 % (ref 11.5–15.5)
WBC: 8.7 10*3/uL (ref 4.0–10.5)

## 2013-12-27 LAB — HEPATIC FUNCTION PANEL
ALBUMIN: 4.2 g/dL (ref 3.5–5.2)
ALT: 17 U/L (ref 0–35)
AST: 17 U/L (ref 0–37)
Alkaline Phosphatase: 72 U/L (ref 39–117)
BILIRUBIN DIRECT: 0.1 mg/dL (ref 0.0–0.3)
Indirect Bilirubin: 0.2 mg/dL (ref 0.2–1.2)
Total Bilirubin: 0.3 mg/dL (ref 0.2–1.2)
Total Protein: 6.7 g/dL (ref 6.0–8.3)

## 2013-12-27 LAB — AMYLASE: AMYLASE: 29 U/L (ref 0–105)

## 2013-12-27 LAB — BASIC METABOLIC PANEL
BUN: 17 mg/dL (ref 6–23)
CO2: 29 meq/L (ref 19–32)
Calcium: 9.1 mg/dL (ref 8.4–10.5)
Chloride: 104 mEq/L (ref 96–112)
Creat: 0.97 mg/dL (ref 0.50–1.10)
Glucose, Bld: 113 mg/dL — ABNORMAL HIGH (ref 70–99)
Potassium: 4.5 mEq/L (ref 3.5–5.3)
SODIUM: 143 meq/L (ref 135–145)

## 2013-12-27 LAB — LIPASE: LIPASE: 20 U/L (ref 0–75)

## 2013-12-27 NOTE — Patient Instructions (Signed)
Abdominal Pain, Adult °Many things can cause abdominal pain. Usually, abdominal pain is not caused by a disease and will improve without treatment. It can often be observed and treated at home. Your health care provider will do a physical exam and possibly order blood tests and X-rays to help determine the seriousness of your pain. However, in many cases, more time must pass before a clear cause of the pain can be found. Before that point, your health care provider may not know if you need more testing or further treatment. °HOME CARE INSTRUCTIONS  °Monitor your abdominal pain for any changes. The following actions may help to alleviate any discomfort you are experiencing: °· Only take over-the-counter or prescription medicines as directed by your health care provider. °· Do not take laxatives unless directed to do so by your health care provider. °· Try a clear liquid diet (broth, tea, or water) as directed by your health care provider. Slowly move to a bland diet as tolerated. °SEEK MEDICAL CARE IF: °· You have unexplained abdominal pain. °· You have abdominal pain associated with nausea or diarrhea. °· You have pain when you urinate or have a bowel movement. °· You experience abdominal pain that wakes you in the night. °· You have abdominal pain that is worsened or improved by eating food. °· You have abdominal pain that is worsened with eating fatty foods. °SEEK IMMEDIATE MEDICAL CARE IF:  °· Your pain does not go away within 2 hours. °· You have a fever. °· You keep throwing up (vomiting). °· Your pain is felt only in portions of the abdomen, such as the right side or the left lower portion of the abdomen. °· You pass bloody or black tarry stools. °MAKE SURE YOU: °· Understand these instructions.   °· Will watch your condition.   °· Will get help right away if you are not doing well or get worse.   °Document Released: 08/10/2005 Document Revised: 08/21/2013 Document Reviewed: 07/10/2013 °[employer]® Patient  Information ©2014 [employer]. ° °

## 2013-12-27 NOTE — Progress Notes (Signed)
Subjective:     Kayla [name] is a 68 y.o. female who presents for evaluation of abdominal pain. Onset was several years ago. Symptoms have been gradually worsening. The pain is described as dull and sharp, and is 3/10 in intensity. Pain is located in the LUQ with radiation to back.  Aggravating factors: none.  Alleviating factors: none. Associated symptoms: nausea and bloating with light color stools. The patient denies anorexia, arthralagias, belching, chills, constipation, diarrhea, dysuria, fever, flatus, frequency, headache, hematochezia, hematuria, melena, myalgias, sweats and vomiting.  The patient's history has been marked as reviewed and updated as appropriate. Past Medical History  Diagnosis Date  . Allergy     allergic rhinitis  . Anemia     NOS  . Depression   . GERD (gastroesophageal reflux disease)   . Hyperlipidemia   . Thyroid disease     hypothyroidism  . Arthritis     osteoarthritis  . Hx of colonic polyps   . History of diverticulitis of colon   . Headache(784.0)   . Urinary incontinence   . Migraine   . Fibromyalgia   . Miscarriage   . Depression with anxiety 01/19/2009    Qualifier: Diagnosis of  By: Redmond Pulling MD, Frann Rider    . Obesity, unspecified 10/26/2013   Patient Active Problem List   Diagnosis Date Noted  . Obesity, unspecified 10/26/2013  . Bladder pain 05/04/2012  . Asthma 05/04/2012  . Musculoskeletal pain 02/01/2012  . Fecal incontinence 12/13/2011  . Vaginal itching 12/13/2011  . Hip pain, right 06/10/2011  . POSTMENOPAUSAL STATUS 09/21/2010  . MUSCLE WEAKNESS (GENERALIZED) 08/31/2010  . SOMNOLENCE 03/09/2010  . COUGH 02/08/2010  . HYPOTHYROIDISM 01/19/2009  . HYPERLIPIDEMIA 01/19/2009  . ANEMIA-NOS 01/19/2009  . ANXIETY 01/19/2009  . Depression with anxiety 01/19/2009  . Chronic pain syndrome 01/19/2009  . COMMON MIGRAINE 01/19/2009  . ALLERGIC RHINITIS 01/19/2009  . GERD 01/19/2009  . OSTEOARTHRITIS 01/19/2009  . FIBROMYALGIA  01/19/2009  . FATIGUE 01/19/2009  . HEADACHE 01/19/2009  . URINARY INCONTINENCE 01/19/2009  . COLONIC POLYPS, HX OF 01/19/2009  . DIVERTICULITIS, HX OF 01/19/2009   Past Surgical History  Procedure Laterality Date  . Cholecystectomy    . Abdominal hysterectomy    . Tonsillectomy     Family History  Problem Relation Age of Onset  . Cancer Other     female, brain, lung  . Heart disease Other   . Stroke Other   . Diabetes Other   . Arthritis Other   . Hyperlipidemia Other   . Hypertension Other   . Alzheimer's disease Mother   . Alzheimer's disease Father    History   Social History  . Marital Status: Divorced    Spouse Name: N/A    Number of Children: N/A  . Years of Education: N/A   Occupational History  . unemployed    Social History Main Topics  . Smoking status: Never Smoker   . Smokeless tobacco: Never Used  . Alcohol Use: Yes  . Drug Use: None  . Sexual Activity: None   Other Topics Concern  . None   Social History Narrative  . None   Current Outpatient Prescriptions  Medication Sig Dispense Refill  . atorvastatin (LIPITOR) 10 MG tablet Take 1 tablet (10 mg total) by mouth every other day.  15 tablet  3  . betamethasone dipropionate (DIPROLENE) 0.05 % ointment as needed.      . clonazePAM (KLONOPIN) 1 MG tablet Take 1 tablet (1 mg total) by  mouth 3 (three) times daily as needed for anxiety.  90 tablet  1  . cyclobenzaprine (FLEXERIL) 10 MG tablet Take 1 tablet by mouth at bedtime as needed. Alternate with SOMA.      . DULERA 100-5 MCG/ACT AERO INHALE 2 PUFFS INTO THE LUNGS 2 (TWO) TIMES DAILY.  1 Inhaler  3  . DULoxetine (CYMBALTA) 60 MG capsule Take 60 mg by mouth daily.      . fluticasone (FLONASE) 50 MCG/ACT nasal spray Place 2 sprays into the nose daily.  16 g  6  . gabapentin (NEURONTIN) 300 MG capsule 900 mg daily. Take 2 capsules three times a day      . mirtazapine (REMERON) 15 MG tablet Take 1 tablet by mouth at bedtime.      . ondansetron  (ZOFRAN) 4 MG tablet Take 1 tablet (4 mg total) by mouth every 8 (eight) hours as needed for nausea.  30 tablet  0  . Tapentadol HCl (NUCYNTA ER) 200 MG TB12 Take 1 tablet by mouth every 12 (twelve) hours.      Marland Kitchen thyroid (ARMOUR THYROID) 15 MG tablet Take 1 tablet (15 mg total) by mouth daily.  30 tablet  5  . topiramate (TOPAMAX) 25 MG tablet TAKE 1 TABLET TWICE DAILY.  60 tablet  2   No current facility-administered medications for this visit.   Allergies: Adhesive; Penicillins; and Promethazine hcl  Review of Systems Pertinent items are noted in HPI.     Objective:    BP 116/78  Pulse 84  Temp(Src) 98.3 F (36.8 C) (Oral)  Wt 227 lb (102.967 kg)  SpO2 95%  LMP 11/14/1978 General appearance: alert, cooperative, appears stated age and no distress Throat: lips, mucosa, and tongue normal; teeth and gums normal Neck: no adenopathy, supple, symmetrical, trachea midline and thyroid not enlarged, symmetric, no tenderness/mass/nodules Lungs: clear to auscultation bilaterally Heart: S1, S2 normal Abdomen: abnormal findings:  mild tenderness in the epigastrium and in the RUQ Extremities: extremities normal, atraumatic, no cyanosis or edema    Assessment:    Abdominal pain, likely secondary to  .    Plan:    The diagnosis was discussed with the patient and evaluation and treatment plans outlined. See orders for lab and imaging studies. Adhere to simple, bland diet. Initiate empiric trial of acid suppression; see orders. Further follow-up plans will be based on outcome of lab/imaging studies; see orders. Follow up as needed.  Subjective:     Kayla [name] is a 68 y.o. female who presents for evaluation of abdominal pain. Onset was several years ago. Symptoms have been gradually worsening. The pain is described as aching and sharp, and is 4/10 in intensity. Pain is located in the epigastric region with radiation to back.  Aggravating factors: none.  Alleviating factors: none.  Associated symptoms: none. The patient denies anorexia, arthralagias, belching, chills, constipation, diarrhea, dysuria, fever, flatus, frequency, headache, hematochezia, hematuria, melena, myalgias, sweats and vomiting.  The patient's history has been marked as reviewed and updated as appropriate.  Review of Systems Pertinent items are noted in HPI.     Objective:    BP 116/78  Pulse 84  Temp(Src) 98.3 F (36.8 C) (Oral)  Wt 227 lb (102.967 kg)  SpO2 95%  LMP 11/14/1978 General appearance: alert, cooperative, appears stated age and no distress Throat: lips, mucosa, and tongue normal; teeth and gums normal Neck: no adenopathy, supple, symmetrical, trachea midline and thyroid not enlarged, symmetric, no tenderness/mass/nodules Lungs: clear to auscultation bilaterally  Heart: regular rate and rhythm, S1, S2 normal, no murmur, click, rub or gallop Abdomen: abnormal findings:  moderate tenderness in the upper abdomen    Assessment:    Abdominal pain, ? etiology .    Plan:    The diagnosis was discussed with the patient and evaluation and treatment plans outlined. See orders for lab and imaging studies. Adhere to simple, bland diet. Initiate empiric trial of acid suppression; see orders. Further follow-up plans will be based on outcome of lab/imaging studies; see orders.

## 2013-12-27 NOTE — Progress Notes (Signed)
Pre visit review using our clinic review tool, if applicable. No additional management support is needed unless otherwise documented below in the visit note. 

## 2013-12-30 LAB — POCT URINALYSIS DIPSTICK
Bilirubin, UA: NEGATIVE
Blood, UA: NEGATIVE
GLUCOSE UA: NEGATIVE
Ketones, UA: NEGATIVE
Leukocytes, UA: NEGATIVE
NITRITE UA: NEGATIVE
Spec Grav, UA: >=1.03
UROBILINOGEN UA: 0.2
pH, UA: 6

## 2013-12-30 LAB — H. PYLORI ANTIBODY, IGG

## 2013-12-31 ENCOUNTER — Ambulatory Visit (HOSPITAL_BASED_OUTPATIENT_CLINIC_OR_DEPARTMENT_OTHER)
Admission: RE | Admit: 2013-12-31 | Discharge: 2013-12-31 | Disposition: A | Discharge status: Home / Self Care | Payer: Medicare Other | Source: Ambulatory Visit | Attending: Family Medicine | Admitting: Family Medicine

## 2013-12-31 DIAGNOSIS — R9389 Abnormal findings on diagnostic imaging of other specified body structures: Secondary | ICD-10-CM | POA: Diagnosis not present

## 2013-12-31 DIAGNOSIS — R1013 Epigastric pain: Secondary | ICD-10-CM

## 2014-01-01 ENCOUNTER — Other Ambulatory Visit: Payer: Self-pay | Admitting: Family Medicine

## 2014-01-01 DIAGNOSIS — R109 Unspecified abdominal pain: Secondary | ICD-10-CM

## 2014-01-02 ENCOUNTER — Encounter (HOSPITAL_BASED_OUTPATIENT_CLINIC_OR_DEPARTMENT_OTHER): Payer: Self-pay

## 2014-01-02 ENCOUNTER — Ambulatory Visit (HOSPITAL_BASED_OUTPATIENT_CLINIC_OR_DEPARTMENT_OTHER)
Admission: RE | Admit: 2014-01-02 | Discharge: 2014-01-02 | Disposition: A | Discharge status: Home / Self Care | Payer: Medicare Other | Source: Ambulatory Visit | Attending: Family Medicine | Admitting: Family Medicine

## 2014-01-02 DIAGNOSIS — K439 Ventral hernia without obstruction or gangrene: Secondary | ICD-10-CM | POA: Insufficient documentation

## 2014-01-02 DIAGNOSIS — K449 Diaphragmatic hernia without obstruction or gangrene: Secondary | ICD-10-CM | POA: Diagnosis not present

## 2014-01-02 DIAGNOSIS — I2510 Atherosclerotic heart disease of native coronary artery without angina pectoris: Secondary | ICD-10-CM | POA: Insufficient documentation

## 2014-01-02 DIAGNOSIS — R110 Nausea: Secondary | ICD-10-CM | POA: Diagnosis not present

## 2014-01-02 DIAGNOSIS — R141 Gas pain: Secondary | ICD-10-CM | POA: Insufficient documentation

## 2014-01-02 DIAGNOSIS — R109 Unspecified abdominal pain: Secondary | ICD-10-CM

## 2014-01-02 DIAGNOSIS — Z9089 Acquired absence of other organs: Secondary | ICD-10-CM | POA: Diagnosis not present

## 2014-01-02 DIAGNOSIS — R142 Eructation: Secondary | ICD-10-CM | POA: Insufficient documentation

## 2014-01-02 DIAGNOSIS — R143 Flatulence: Secondary | ICD-10-CM

## 2014-01-02 HISTORY — DX: Unspecified asthma, uncomplicated: J45.909

## 2014-01-02 MED ORDER — IOHEXOL 350 MG/ML SOLN
100.0000 mL | Freq: Once | INTRAVENOUS | Status: AC | PRN
  Administered 2014-01-02: 100 mL via INTRAVENOUS

## 2014-01-03 ENCOUNTER — Other Ambulatory Visit: Payer: Self-pay

## 2014-01-03 ENCOUNTER — Telehealth: Payer: Self-pay | Admitting: *Deleted

## 2014-01-03 DIAGNOSIS — K449 Diaphragmatic hernia without obstruction or gangrene: Secondary | ICD-10-CM

## 2014-01-03 NOTE — Telephone Encounter (Signed)
Message copied by Chilton Greathouse on Fri Jan 03, 2014 12:27 PM ------      Message from: Rosalita Chessman      Created: Thu Jan 02, 2014  3:38 PM       Massive hiatal hernia-- refer to GI ------

## 2014-01-03 NOTE — Telephone Encounter (Signed)
Spoke with patient and made aware of CT results and that she would be contacted by GI with appt date and time for further evaluation of hiatal hernia

## 2014-01-06 ENCOUNTER — Encounter: Payer: Self-pay | Admitting: Gastroenterology

## 2014-01-07 ENCOUNTER — Other Ambulatory Visit: Payer: Self-pay | Admitting: Family Medicine

## 2014-01-08 NOTE — Telephone Encounter (Signed)
Refill request for clonazepam Last filled by MD on - 10/25/13 #90 x1 Last Appt: 12/05/2013 Next Appt: 02/03/2014 Please advise refill?

## 2014-01-08 NOTE — Telephone Encounter (Signed)
RX faxed

## 2014-01-21 IMAGING — CR DG ABDOMEN 1V
1 series · 1 of 1 positions shown · non-contrast
Comparison: None.

CLINICAL DATA: Diarrhea, constipation.

ABDOMEN - 1 VIEW

[t abdomen supine]
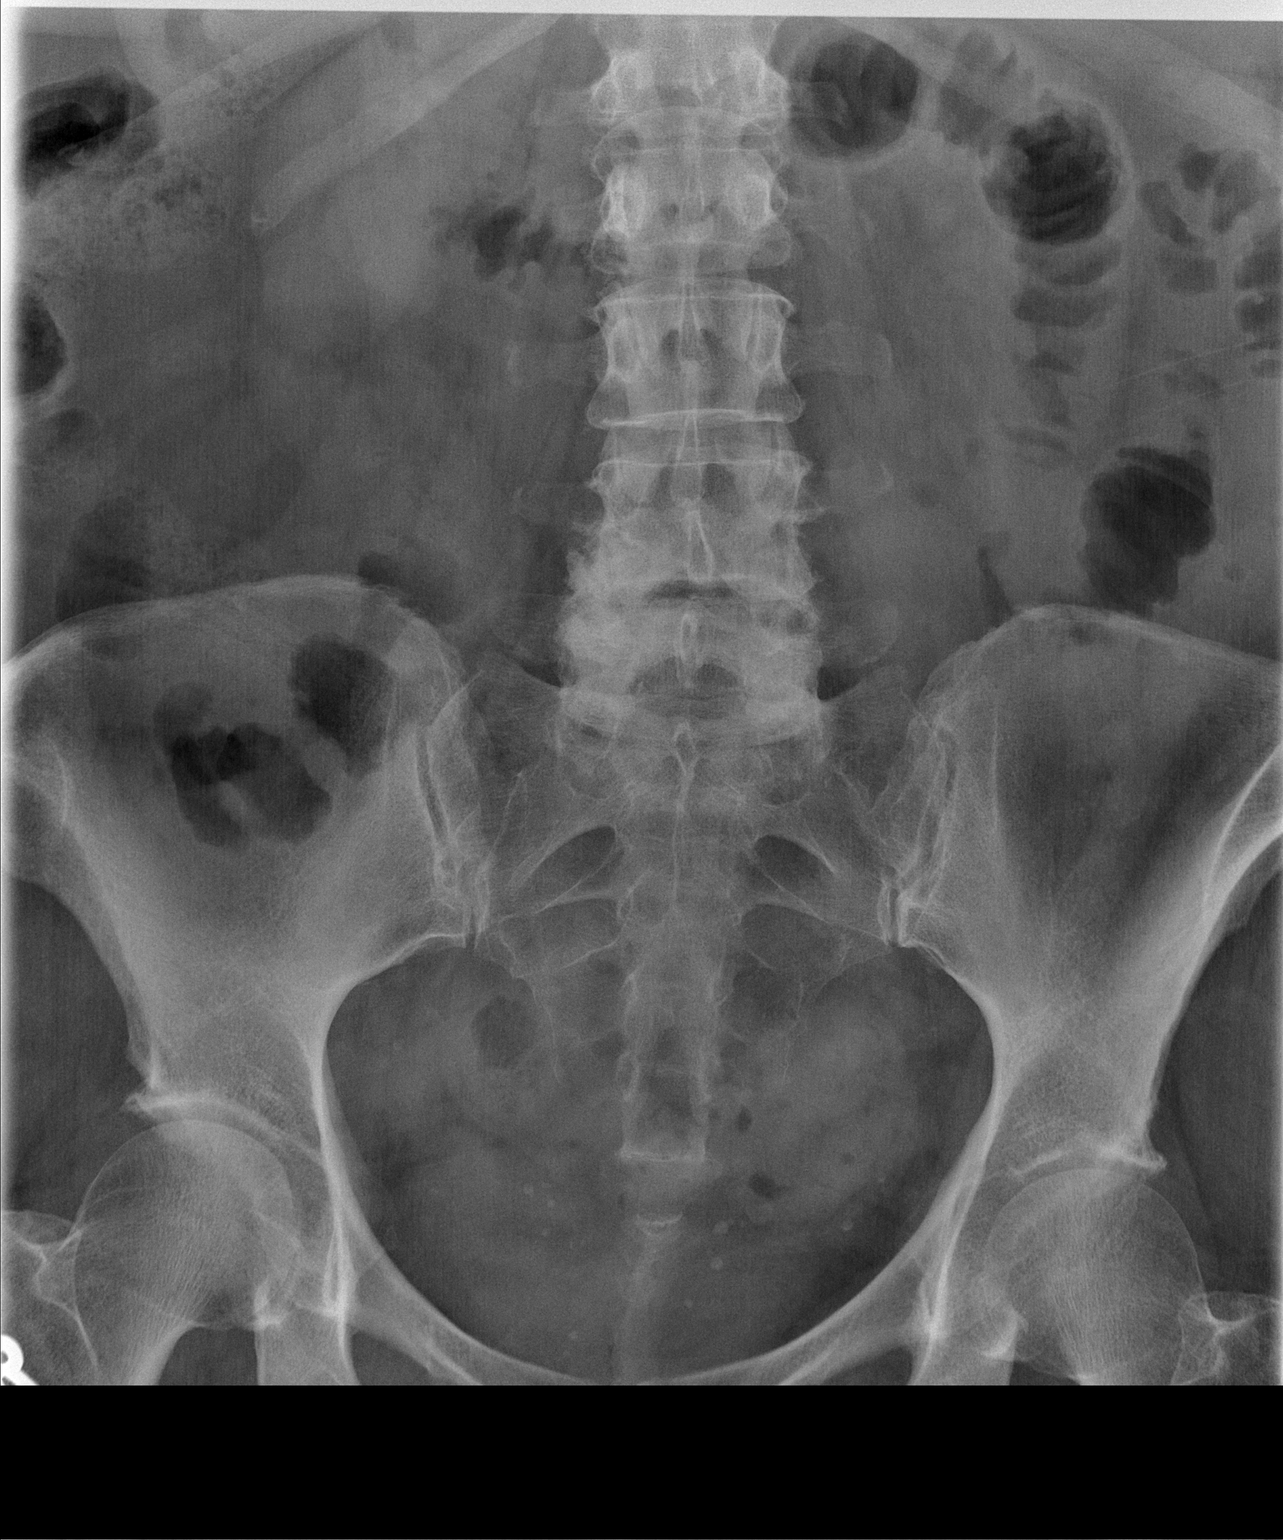

[1 of 1 positions shown; findings below may reference images not displayed]

FINDINGS: There is a nonobstructive bowel gas pattern.  No supine
evidence of free air.  No suspicious calcification organomegaly.
Small calcified phleboliths in the anatomic pelvis.  Degenerative
changes in the lumbar spine and hips.
IMPRESSION: No acute findings.

## 2014-01-23 ENCOUNTER — Ambulatory Visit: Payer: Medicare Other | Admitting: Family Medicine

## 2014-01-27 ENCOUNTER — Ambulatory Visit: Payer: Medicare Other | Admitting: Gastroenterology

## 2014-01-28 ENCOUNTER — Ambulatory Visit: Payer: Medicare Other | Admitting: Family Medicine

## 2014-01-29 DIAGNOSIS — M545 Low back pain, unspecified: Secondary | ICD-10-CM | POA: Diagnosis not present

## 2014-01-29 DIAGNOSIS — Z79899 Other long term (current) drug therapy: Secondary | ICD-10-CM | POA: Diagnosis not present

## 2014-01-29 DIAGNOSIS — M25559 Pain in unspecified hip: Secondary | ICD-10-CM | POA: Diagnosis not present

## 2014-01-29 DIAGNOSIS — Z5181 Encounter for therapeutic drug level monitoring: Secondary | ICD-10-CM | POA: Diagnosis not present

## 2014-02-03 ENCOUNTER — Ambulatory Visit (INDEPENDENT_AMBULATORY_CARE_PROVIDER_SITE_OTHER): Payer: Medicare Other | Admitting: Family Medicine

## 2014-02-03 ENCOUNTER — Encounter: Payer: Self-pay | Admitting: Family Medicine

## 2014-02-03 VITALS — BP 130/82 | HR 73 | Temp 97.7°F | Ht 67.0 in | Wt 239.1 lb

## 2014-02-03 DIAGNOSIS — E039 Hypothyroidism, unspecified: Secondary | ICD-10-CM | POA: Diagnosis not present

## 2014-02-03 DIAGNOSIS — E785 Hyperlipidemia, unspecified: Secondary | ICD-10-CM | POA: Diagnosis not present

## 2014-02-03 DIAGNOSIS — K449 Diaphragmatic hernia without obstruction or gangrene: Secondary | ICD-10-CM

## 2014-02-03 DIAGNOSIS — R109 Unspecified abdominal pain: Secondary | ICD-10-CM | POA: Diagnosis not present

## 2014-02-03 MED ORDER — HYOSCYAMINE SULFATE 0.125 MG SL SUBL: 0.1250 mg | SUBLINGUAL_TABLET | SUBLINGUAL | Status: DC | PRN

## 2014-02-03 NOTE — Patient Instructions (Addendum)
Encouraged increased hydration and fiber in diet. Daily probiotics. If bowels not moving can use MOM 2 tbls po in 4 oz of warm prune juice by mouth every 2-3 days. If no results then repeat in 4 hours with  Dulcolax suppository pr, may repeat again in 4 more hours as needed. Seek care if symptoms worsen. Consider daily Miralax and/or Dulcolax if symptoms persist. Probiotic daily such as Digestive Advantage   Hiatal Hernia A hiatal hernia occurs when a part of the stomach slides above the diaphragm. The diaphragm is the thin muscle separating the belly (abdomen) from the chest. A hiatal hernia can be something you are born with or develop over time. Hiatal hernias may allow stomach acid to flow back into your esophagus, the tube which carries food from your mouth to your stomach. If this acid causes problems it is called GERD (gastro-esophageal reflux disease).  SYMPTOMS  Common symptoms of GERD are heartburn (burning in your chest). This is worse when lying down or bending over. It may also cause belching and indigestion. Some of the things which make GERD worse are:  Increased weight pushes on stomach making acid rise more easily.  Smoking markedly increases acid production.  Alcohol decreases lower esophageal sphincter pressure (valve between stomach and esophagus), allowing acid from stomach into esophagus.  Late evening meals and going to bed with a full stomach increases pressure.  Anything that causes an increase in acid production.  Lower esophageal sphincter incompetence. DIAGNOSIS  Hiatal hernia is often diagnosed with x-rays of your stomach and small bowel. This is called an UGI (upper gastrointestinal x-ray). Sometimes a gastroscopic procedure is done. This is a procedure where your caregiver uses a flexible instrument to look into the stomach and small bowel. HOME CARE INSTRUCTIONS   Try to achieve and maintain an ideal body weight.  Avoid drinking alcoholic beverages.  Stop  smoking.  Put the head of your bed on 4 to 6 inch blocks. This will keep your head and esophagus higher than your stomach. If you cannot use blocks, sleep with several pillows under your head and shoulders.  Over-the-counter medications will decrease acid production. Your caregiver can also prescribe medications for this. Take as directed.  1/2 to 1 teaspoon of an antacid taken every hour while awake, with meals and at bedtime, will neutralize acid.  Do not take aspirin, ibuprofen (Advil or Motrin), or other nonsteroidal anti-inflammatory drugs.  Do not wear tight clothing around your chest or stomach.  Eat smaller meals and eat more frequently. This keeps your stomach from getting too full. Eat slowly.  Do not lie down for 2 or 3 hours after eating. Do not eat or drink anything 1 to 2 hours before going to bed.  Avoid caffeine beverages (colas, coffee, cocoa, tea), fatty foods, citrus fruits and all other foods and drinks that contain acid and that seem to increase the problems.  Avoid bending over, especially after eating. Also avoid straining during bowel movements or when urinating or lifting things. Anything that increases the pressure in your belly increases the amount of acid that may be pushed up into your esophagus. SEEK IMMEDIATE MEDICAL CARE IF:  There is change in location (pain in arms, neck, jaw, teeth or back) of your pain, or the pain is getting worse.  You also experience nausea, vomiting, sweating (diaphoresis), or shortness of breath.  You develop continual vomiting, vomit blood or coffee ground material, have bright red blood in your stools, or have black tarry  stools. Some of these symptoms could signal other problems such as heart disease. MAKE SURE YOU:   Understand these instructions.  Monitor your condition.  Contact your caregiver if you are not doing well or are getting worse. Document Released: 01/21/2004 Document Revised: 01/23/2012 Document Reviewed:  10/31/2005 [facility] Patient Information 2014 [place], [place].

## 2014-02-03 NOTE — Progress Notes (Signed)
Pre visit review using our clinic review tool, if applicable. No additional management support is needed unless otherwise documented below in the visit note. 

## 2014-02-08 ENCOUNTER — Encounter: Payer: Self-pay | Admitting: Family Medicine

## 2014-02-08 DIAGNOSIS — R109 Unspecified abdominal pain: Secondary | ICD-10-CM | POA: Insufficient documentation

## 2014-02-08 NOTE — Assessment & Plan Note (Signed)
On Levothyroxine, continue to monitor 

## 2014-02-08 NOTE — Assessment & Plan Note (Signed)
Encouraged heart healthy diet, increase exercise, avoid trans fats, consider a krill oil cap daily. Did not tolerate Lipitor. Discussed possibility of Livalo declines for now.

## 2014-02-08 NOTE — Assessment & Plan Note (Signed)
Ventral hernia and Hiatal Hernia with recurrent pain referred to general surgery for consideration. Avoid offending foods, start probiotics. Do not eat large meals

## 2014-02-08 NOTE — Progress Notes (Signed)
Patient ID: Kayla [name], female   DOB: Apr 23, 1946, 68 y.o.   MRN: [id number] Kayla [name] [id number] 02/15/1946 02/08/2014      Progress Note-Follow Up  Subjective  Chief Complaint  No chief complaint on file.   HPI  Patient is a 68 year old female in today for routine medical care. Is c/o some persistent loose stool, diarrhea no bloody stool. Notes cloudy urine but no dysuria or hematuria. Stopped Lipitor due to fatigue and myalgias and felt better when stopped. Denies CP/palp/SOB/HA/congestion/fevers. Taking meds as prescribed  Past Medical History  Diagnosis Date  . Allergy     allergic rhinitis  . Anemia     NOS  . Depression   . GERD (gastroesophageal reflux disease)   . Hyperlipidemia   . Thyroid disease     hypothyroidism  . Arthritis     osteoarthritis  . Hx of colonic polyps   . History of diverticulitis of colon   . Headache(784.0)   . Urinary incontinence   . Migraine   . Fibromyalgia   . Miscarriage   . Depression with anxiety 01/19/2009    Qualifier: Diagnosis of  By: Redmond Pulling MD, Frann Rider    . Obesity, unspecified 10/26/2013  . Asthma     Past Surgical History  Procedure Laterality Date  . Cholecystectomy    . Abdominal hysterectomy    . Tonsillectomy      Family History  Problem Relation Age of Onset  . Cancer Other     female, brain, lung  . Heart disease Other   . Stroke Other   . Diabetes Other   . Arthritis Other   . Hyperlipidemia Other   . Hypertension Other   . Alzheimer's disease Mother   . Alzheimer's disease Father     History   Social History  . Marital Status: Divorced    Spouse Name: N/A    Number of Children: N/A  . Years of Education: N/A   Occupational History  . unemployed    Social History Main Topics  . Smoking status: Never Smoker   . Smokeless tobacco: Never Used  . Alcohol Use: Yes  . Drug Use: Not on file  . Sexual Activity: Not on file   Other Topics Concern  . Not on file   Social History Narrative   . No narrative on file    Current Outpatient Prescriptions on File Prior to Visit  Medication Sig Dispense Refill  . betamethasone dipropionate (DIPROLENE) 0.05 % ointment as needed.      . clonazePAM (KLONOPIN) 1 MG tablet Take 1 tablet (1 mg total) by mouth 3 (three) times daily as needed for anxiety.  90 tablet  1  . clonazePAM (KLONOPIN) 2 MG tablet TAKE 1 TABLET BY MOUTH TWICE A DAY AS NEEDED FOR ANXIETY OR INSOMNIA  40 tablet  0  . cyclobenzaprine (FLEXERIL) 10 MG tablet Take 1 tablet by mouth at bedtime as needed. Alternate with SOMA.      . DULERA 100-5 MCG/ACT AERO INHALE 2 PUFFS INTO THE LUNGS 2 (TWO) TIMES DAILY.  1 Inhaler  3  . DULoxetine (CYMBALTA) 60 MG capsule Take 60 mg by mouth daily.      . fluticasone (FLONASE) 50 MCG/ACT nasal spray Place 2 sprays into the nose daily.  16 g  6  . gabapentin (NEURONTIN) 300 MG capsule 900 mg daily. Take 2 capsules three times a day      . mirtazapine (REMERON) 15 MG tablet Take 1  tablet by mouth at bedtime.      . ondansetron (ZOFRAN) 4 MG tablet Take 1 tablet (4 mg total) by mouth every 8 (eight) hours as needed for nausea.  30 tablet  0  . Tapentadol HCl (NUCYNTA ER) 200 MG TB12 Take 1 tablet by mouth every 12 (twelve) hours.      Marland Kitchen thyroid (ARMOUR THYROID) 15 MG tablet Take 1 tablet (15 mg total) by mouth daily.  30 tablet  5  . topiramate (TOPAMAX) 25 MG tablet TAKE 1 TABLET TWICE DAILY.  60 tablet  2   No current facility-administered medications on file prior to visit.    Allergies  Allergen Reactions  . Adhesive [Tape]     blisters  . Penicillins     REACTION: rash  . Promethazine Hcl     Review of Systems  Review of Systems  Constitutional: Negative for fever and malaise/fatigue.  HENT: Negative for congestion.   Eyes: Negative for discharge.  Respiratory: Negative for shortness of breath.   Cardiovascular: Negative for chest pain, palpitations and leg swelling.  Gastrointestinal: Positive for abdominal pain and  diarrhea. Negative for nausea and constipation.  Genitourinary: Positive for frequency. Negative for dysuria.  Musculoskeletal: Negative for falls.  Skin: Negative for rash.  Neurological: Negative for loss of consciousness and headaches.  Endo/Heme/Allergies: Negative for polydipsia.  Psychiatric/Behavioral: Negative for depression and suicidal ideas. The patient is not nervous/anxious and does not have insomnia.     Objective  BP 130/82  Pulse 73  Temp(Src) 97.7 F (36.5 C) (Oral)  Ht 5\' 7"  (1.702 m)  Wt 239 lb 1.9 oz (108.464 kg)  BMI 37.44 kg/m2  SpO2 97%  LMP 11/14/1978  Physical Exam  Physical Exam  Constitutional: She is oriented to person, place, and time and well-developed, well-nourished, and in no distress. No distress.  HENT:  Head: Normocephalic and atraumatic.  Eyes: Conjunctivae are normal.  Neck: Neck supple. No thyromegaly present.  Cardiovascular: Normal rate, regular rhythm and normal heart sounds.   No murmur heard. Pulmonary/Chest: Effort normal and breath sounds normal. She has no wheezes.  Abdominal: She exhibits no distension and no mass.  Musculoskeletal: She exhibits no edema.  Lymphadenopathy:    She has no cervical adenopathy.  Neurological: She is alert and oriented to person, place, and time.  Skin: Skin is warm and dry. No rash noted. She is not diaphoretic.  Psychiatric: Memory, affect and judgment normal.    Lab Results  Component Value Date   TSH 1.116 10/25/2013   Lab Results  Component Value Date   WBC 8.7 12/27/2013   HGB 14.0 12/27/2013   HCT 41.4 12/27/2013   MCV 82.6 12/27/2013   PLT 280 12/27/2013   Lab Results  Component Value Date   CREATININE 0.97 12/27/2013   BUN 17 12/27/2013   NA 143 12/27/2013   K 4.5 12/27/2013   CL 104 12/27/2013   CO2 29 12/27/2013   Lab Results  Component Value Date   ALT 17 12/27/2013   AST 17 12/27/2013   ALKPHOS 72 12/27/2013   BILITOT 0.3 12/27/2013   Lab Results  Component Value Date    CHOL 271* 10/25/2013   Lab Results  Component Value Date   HDL 78 10/25/2013   Lab Results  Component Value Date   LDLCALC 173* 10/25/2013   Lab Results  Component Value Date   TRIG 102 10/25/2013   Lab Results  Component Value Date   CHOLHDL 3.5 10/25/2013  Assessment & Plan  HYPOTHYROIDISM On Levothyroxine, continue to monitor  HYPERLIPIDEMIA Encouraged heart healthy diet, increase exercise, avoid trans fats, consider a krill oil cap daily. Did not tolerate Lipitor. Discussed possibility of Livalo declines for now.   Abdominal pain, unspecified site Ventral hernia and Hiatal Hernia with recurrent pain referred to general surgery for consideration. Avoid offending foods, start probiotics. Do not eat large meals

## 2014-03-04 ENCOUNTER — Ambulatory Visit (HOSPITAL_BASED_OUTPATIENT_CLINIC_OR_DEPARTMENT_OTHER)
Admission: RE | Admit: 2014-03-04 | Discharge: 2014-03-04 | Disposition: A | Discharge status: Home / Self Care | Payer: Medicare Other | Source: Ambulatory Visit | Attending: Physician Assistant | Admitting: Physician Assistant

## 2014-03-04 ENCOUNTER — Ambulatory Visit (INDEPENDENT_AMBULATORY_CARE_PROVIDER_SITE_OTHER): Payer: Medicare Other | Admitting: Family Medicine

## 2014-03-04 ENCOUNTER — Other Ambulatory Visit (HOSPITAL_BASED_OUTPATIENT_CLINIC_OR_DEPARTMENT_OTHER): Payer: Self-pay | Admitting: Physician Assistant

## 2014-03-04 ENCOUNTER — Ambulatory Visit (HOSPITAL_BASED_OUTPATIENT_CLINIC_OR_DEPARTMENT_OTHER)
Admission: RE | Admit: 2014-03-04 | Discharge: 2014-03-04 | Disposition: A | Discharge status: Home / Self Care | Payer: Medicare Other | Source: Ambulatory Visit | Attending: Family Medicine | Admitting: Family Medicine

## 2014-03-04 ENCOUNTER — Other Ambulatory Visit: Payer: Self-pay | Admitting: Family Medicine

## 2014-03-04 VITALS — BP 112/92 | HR 104 | Temp 98.1°F | Ht 67.0 in | Wt 232.0 lb

## 2014-03-04 DIAGNOSIS — G4700 Insomnia, unspecified: Secondary | ICD-10-CM

## 2014-03-04 DIAGNOSIS — R0609 Other forms of dyspnea: Secondary | ICD-10-CM

## 2014-03-04 DIAGNOSIS — M7918 Myalgia, other site: Secondary | ICD-10-CM

## 2014-03-04 DIAGNOSIS — M169 Osteoarthritis of hip, unspecified: Secondary | ICD-10-CM | POA: Diagnosis not present

## 2014-03-04 DIAGNOSIS — M545 Low back pain, unspecified: Secondary | ICD-10-CM | POA: Diagnosis not present

## 2014-03-04 DIAGNOSIS — M549 Dorsalgia, unspecified: Secondary | ICD-10-CM | POA: Diagnosis not present

## 2014-03-04 DIAGNOSIS — R059 Cough, unspecified: Secondary | ICD-10-CM | POA: Insufficient documentation

## 2014-03-04 DIAGNOSIS — M47817 Spondylosis without myelopathy or radiculopathy, lumbosacral region: Secondary | ICD-10-CM | POA: Diagnosis not present

## 2014-03-04 DIAGNOSIS — R0600 Dyspnea, unspecified: Secondary | ICD-10-CM

## 2014-03-04 DIAGNOSIS — IMO0001 Reserved for inherently not codable concepts without codable children: Secondary | ICD-10-CM

## 2014-03-04 DIAGNOSIS — R109 Unspecified abdominal pain: Secondary | ICD-10-CM

## 2014-03-04 DIAGNOSIS — E039 Hypothyroidism, unspecified: Secondary | ICD-10-CM

## 2014-03-04 DIAGNOSIS — M51379 Other intervertebral disc degeneration, lumbosacral region without mention of lumbar back pain or lower extremity pain: Secondary | ICD-10-CM | POA: Insufficient documentation

## 2014-03-04 DIAGNOSIS — M1610 Unilateral primary osteoarthritis, unspecified hip: Secondary | ICD-10-CM | POA: Diagnosis not present

## 2014-03-04 DIAGNOSIS — M5137 Other intervertebral disc degeneration, lumbosacral region: Secondary | ICD-10-CM | POA: Insufficient documentation

## 2014-03-04 DIAGNOSIS — E669 Obesity, unspecified: Secondary | ICD-10-CM

## 2014-03-04 DIAGNOSIS — G4730 Sleep apnea, unspecified: Secondary | ICD-10-CM

## 2014-03-04 DIAGNOSIS — J45909 Unspecified asthma, uncomplicated: Secondary | ICD-10-CM

## 2014-03-04 DIAGNOSIS — R509 Fever, unspecified: Secondary | ICD-10-CM | POA: Insufficient documentation

## 2014-03-04 DIAGNOSIS — R05 Cough: Secondary | ICD-10-CM

## 2014-03-04 DIAGNOSIS — K449 Diaphragmatic hernia without obstruction or gangrene: Principal | ICD-10-CM

## 2014-03-04 DIAGNOSIS — E079 Disorder of thyroid, unspecified: Secondary | ICD-10-CM

## 2014-03-04 DIAGNOSIS — M25559 Pain in unspecified hip: Secondary | ICD-10-CM

## 2014-03-04 DIAGNOSIS — K219 Gastro-esophageal reflux disease without esophagitis: Secondary | ICD-10-CM

## 2014-03-04 DIAGNOSIS — R0989 Other specified symptoms and signs involving the circulatory and respiratory systems: Secondary | ICD-10-CM

## 2014-03-04 MED ORDER — THYROID 30 MG PO TABS: 30.0000 mg | ORAL_TABLET | Freq: Every day | ORAL | Status: DC

## 2014-03-04 MED ORDER — FLUTICASONE-SALMETEROL 100-50 MCG/DOSE IN AEPB: 1.0000 | INHALATION_SPRAY | Freq: Two times a day (BID) | RESPIRATORY_TRACT | Status: DC

## 2014-03-04 MED ORDER — CARISOPRODOL 350 MG PO TABS
350.0000 mg | ORAL_TABLET | Freq: Every evening | ORAL | Status: DC | PRN
Start: 2014-03-04 — End: 2015-10-30

## 2014-03-04 MED ORDER — RANITIDINE HCL 300 MG PO CAPS: 300.0000 mg | ORAL_CAPSULE | Freq: Every evening | ORAL | Status: DC

## 2014-03-04 NOTE — Progress Notes (Signed)
Pre visit review using our clinic review tool, if applicable. No additional management support is needed unless otherwise documented below in the visit note. 

## 2014-03-05 ENCOUNTER — Telehealth: Payer: Self-pay | Admitting: *Deleted

## 2014-03-05 MED ORDER — RANITIDINE HCL 300 MG PO TABS: 300.0000 mg | ORAL_TABLET | Freq: Every day | ORAL | Status: DC

## 2014-03-05 NOTE — Telephone Encounter (Signed)
Received fax from [facility] that pt's insurance will not cover ranidine capsules but it will cover tablets. Rx sent for tablets and left detailed message on pt's home # re: change and to call if any questions.

## 2014-03-09 ENCOUNTER — Encounter: Payer: Self-pay | Admitting: Family Medicine

## 2014-03-09 DIAGNOSIS — K219 Gastro-esophageal reflux disease without esophagitis: Secondary | ICD-10-CM | POA: Insufficient documentation

## 2014-03-09 DIAGNOSIS — K449 Diaphragmatic hernia without obstruction or gangrene: Secondary | ICD-10-CM

## 2014-03-09 NOTE — Progress Notes (Signed)
Patient ID: Kayla [name], female   DOB: May 21, 1946, 68 y.o.   MRN: [id number] Kayla [name] [id number] 09-Jun-1946 03/09/2014      Progress Note-Follow Up  Subjective  Chief Complaint  Chief Complaint  Patient presents with  . Follow-up    4 week    HPI  Patient is a 68 year old female in today for routine medical care. With worsening shortness of breath and cough. Imaging did show very hernia but surgery would like a further GI workup for that we'll see her for consideration. She was also noted to have a ventral hernia and does have intermittent anterior abdominal pain as well. Fortunately her bowels are still moving well. No nausea vomiting or anorexia. No bloody or tarry stool. No palpitations. No recent fever, congestion or headache. She does continues to struggle with anxiety, depression and insomnia. Uses Soma and Flexeril intermittently but not at the same time with decent results Past Medical History  Diagnosis Date  . Allergy     allergic rhinitis  . Anemia     NOS  . Depression   . GERD (gastroesophageal reflux disease)   . Hyperlipidemia   . Thyroid disease     hypothyroidism  . Arthritis     osteoarthritis  . Hx of colonic polyps   . History of diverticulitis of colon   . Headache(784.0)   . Urinary incontinence   . Migraine   . Fibromyalgia   . Miscarriage   . Depression with anxiety 01/19/2009    Qualifier: Diagnosis of  By: Redmond Pulling MD, Frann Rider    . Obesity, unspecified 10/26/2013  . Asthma     Past Surgical History  Procedure Laterality Date  . Cholecystectomy    . Abdominal hysterectomy    . Tonsillectomy      Family History  Problem Relation Age of Onset  . Cancer Other     female, brain, lung  . Heart disease Other   . Stroke Other   . Diabetes Other   . Arthritis Other   . Hyperlipidemia Other   . Hypertension Other   . Alzheimer's disease Mother   . Alzheimer's disease Father     History   Social History  . Marital Status: Divorced   Spouse Name: N/A    Number of Children: N/A  . Years of Education: N/A   Occupational History  . unemployed    Social History Main Topics  . Smoking status: Never Smoker   . Smokeless tobacco: Never Used  . Alcohol Use: Yes  . Drug Use: Not on file  . Sexual Activity: Not on file   Other Topics Concern  . Not on file   Social History Narrative  . No narrative on file    Current Outpatient Prescriptions on File Prior to Visit  Medication Sig Dispense Refill  . clonazePAM (KLONOPIN) 1 MG tablet Take 1 tablet (1 mg total) by mouth 3 (three) times daily as needed for anxiety.  90 tablet  1  . cyclobenzaprine (FLEXERIL) 10 MG tablet Take 1 tablet by mouth at bedtime as needed. Alternate with SOMA.      . DULoxetine (CYMBALTA) 60 MG capsule Take 60 mg by mouth daily.      Marland Kitchen gabapentin (NEURONTIN) 300 MG capsule 900 mg daily. Take 2 capsules three times a day      . hyoscyamine (LEVSIN SL) 0.125 MG SL tablet Place 1 tablet (0.125 mg total) under the tongue every 4 (four) hours as needed.  30 tablet  1  . mirtazapine (REMERON) 15 MG tablet Take 1 tablet by mouth at bedtime.      . ondansetron (ZOFRAN) 4 MG tablet Take 1 tablet (4 mg total) by mouth every 8 (eight) hours as needed for nausea.  30 tablet  0  . Tapentadol HCl (NUCYNTA ER) 200 MG TB12 Take 1 tablet by mouth every 12 (twelve) hours.      . topiramate (TOPAMAX) 25 MG tablet TAKE 1 TABLET TWICE DAILY.  60 tablet  2  . betamethasone dipropionate (DIPROLENE) 0.05 % ointment as needed.      . clonazePAM (KLONOPIN) 2 MG tablet TAKE 1 TABLET BY MOUTH TWICE A DAY AS NEEDED FOR ANXIETY OR INSOMNIA  40 tablet  0  . fluticasone (FLONASE) 50 MCG/ACT nasal spray Place 2 sprays into the nose daily.  16 g  6   No current facility-administered medications on file prior to visit.    Allergies  Allergen Reactions  . Adhesive [Tape]     blisters  . Penicillins     REACTION: rash  . Promethazine Hcl     Review of Systems  Review of  Systems  Constitutional: Positive for malaise/fatigue. Negative for fever.  HENT: Negative for congestion.   Eyes: Negative for discharge.  Respiratory: Positive for cough and shortness of breath.   Cardiovascular: Negative for chest pain, palpitations and leg swelling.  Gastrointestinal: Positive for abdominal pain. Negative for nausea, vomiting, diarrhea, blood in stool and melena.  Genitourinary: Negative for dysuria.  Musculoskeletal: Positive for back pain, joint pain, myalgias and neck pain. Negative for falls.  Skin: Negative for rash.  Neurological: Positive for headaches. Negative for loss of consciousness.  Endo/Heme/Allergies: Negative for polydipsia.  Psychiatric/Behavioral: Negative for depression and suicidal ideas. The patient is not nervous/anxious and does not have insomnia.     Objective  BP 112/92  Pulse 104  Temp(Src) 98.1 F (36.7 C) (Oral)  Ht 5\' 7"  (1.702 m)  Wt 232 lb 0.6 oz (105.253 kg)  BMI 36.33 kg/m2  SpO2 97%  LMP 11/14/1978  Physical Exam  Physical Exam  Constitutional: She is oriented to person, place, and time and well-developed, well-nourished, and in no distress. No distress.  HENT:  Head: Normocephalic and atraumatic.  Eyes: Conjunctivae are normal.  Neck: Neck supple. No thyromegaly present.  Cardiovascular: Normal rate, regular rhythm and normal heart sounds.   No murmur heard. Pulmonary/Chest: Effort normal and breath sounds normal. She has no wheezes.  Abdominal: She exhibits no distension and no mass.  Musculoskeletal: She exhibits no edema.  Lymphadenopathy:    She has no cervical adenopathy.  Neurological: She is alert and oriented to person, place, and time.  Skin: Skin is warm and dry. No rash noted. She is not diaphoretic.  Psychiatric: Memory, affect and judgment normal.    Lab Results  Component Value Date   TSH 1.116 10/25/2013   Lab Results  Component Value Date   WBC 8.7 12/27/2013   HGB 14.0 12/27/2013   HCT 41.4  12/27/2013   MCV 82.6 12/27/2013   PLT 280 12/27/2013   Lab Results  Component Value Date   CREATININE 0.97 12/27/2013   BUN 17 12/27/2013   NA 143 12/27/2013   K 4.5 12/27/2013   CL 104 12/27/2013   CO2 29 12/27/2013   Lab Results  Component Value Date   ALT 17 12/27/2013   AST 17 12/27/2013   ALKPHOS 72 12/27/2013   BILITOT 0.3 12/27/2013  Lab Results  Component Value Date   CHOL 271* 10/25/2013   Lab Results  Component Value Date   HDL 78 10/25/2013   Lab Results  Component Value Date   LDLCALC 173* 10/25/2013   Lab Results  Component Value Date   TRIG 102 10/25/2013   Lab Results  Component Value Date   CHOLHDL 3.5 10/25/2013     Assessment & Plan  HYPOTHYROIDISM Patient reports she has been on Armour Thyroid at 30 mg daily. Will allow and recheck thyroid studies with next blood draw  Hiatal hernia with gastroesophageal reflux Patient with massive hiatal hernia and symptoms referred to surgery for consideration but they have asked for further work up before seeing her. We will refer her back to her gastroenterologist. Avoid offending foods, start probiotics. Do not eat large meals in late evening and consider raising head of bed.   Abdominal pain, unspecified site Ventral hernia noted on imaging. Will need to seek emergent care if pain worsens and does not resolve  Obesity, unspecified Encouraged DASH diet, decrease po intake and increase exercise as tolerated. Needs 7-8 hours of sleep nightly. Avoid trans fats, eat small, frequent meals every 4-5 hours with lean proteins, complex carbs and healthy fats. Minimize simple carbs, GMO foods.  Asthma Worsening sob and cough. Likely complicated by GI symptoms. Did not respond to [facility], will try Advair and referred to pulmonology  Musculoskeletal pain Encouraged moist heat and gentle stretching as tolerated. May try NSAIDs and prescription meds as directed and report if symptoms worsen or seek immediate care. May use  Cyclobenzaprine and Soma sparingly but aware to use them separately.

## 2014-03-09 NOTE — Assessment & Plan Note (Signed)
Encouraged moist heat and gentle stretching as tolerated. May try NSAIDs and prescription meds as directed and report if symptoms worsen or seek immediate care. May use Cyclobenzaprine and Soma sparingly but aware to use them separately.

## 2014-03-09 NOTE — Assessment & Plan Note (Signed)
Worsening sob and cough. Likely complicated by GI symptoms. Did not respond to [facility], will try Advair and referred to pulmonology

## 2014-03-09 NOTE — Assessment & Plan Note (Signed)
Ventral hernia noted on imaging. Will need to seek emergent care if pain worsens and does not resolve

## 2014-03-09 NOTE — Assessment & Plan Note (Signed)
Encouraged DASH diet, decrease po intake and increase exercise as tolerated. Needs 7-8 hours of sleep nightly. Avoid trans fats, eat small, frequent meals every 4-5 hours with lean proteins, complex carbs and healthy fats. Minimize simple carbs, GMO foods. 

## 2014-03-09 NOTE — Assessment & Plan Note (Signed)
Patient with massive hiatal hernia and symptoms referred to surgery for consideration but they have asked for further work up before seeing her. We will refer her back to her gastroenterologist. Avoid offending foods, start probiotics. Do not eat large meals in late evening and consider raising head of bed.

## 2014-03-09 NOTE — Assessment & Plan Note (Signed)
Patient reports she has been on Armour Thyroid at 30 mg daily. Will allow and recheck thyroid studies with next blood draw

## 2014-03-19 ENCOUNTER — Other Ambulatory Visit: Payer: Self-pay | Admitting: Family Medicine

## 2014-03-21 MED ORDER — CLONAZEPAM 1 MG PO TABS: 1.0000 mg | ORAL_TABLET | Freq: Three times a day (TID) | ORAL | Status: DC | PRN

## 2014-03-21 NOTE — Telephone Encounter (Signed)
Please advise? Is pt taking 1 and 2 mg?  Last RX was done on 10-25-13 quantity 90 with 1 refill  If ok fax to [phone]

## 2014-03-21 NOTE — Telephone Encounter (Signed)
1 mg, remove 2 mg from [facility] and I will approve the 1 mg

## 2014-03-21 NOTE — Telephone Encounter (Signed)
Printed for md and faxed

## 2014-03-21 NOTE — Addendum Note (Signed)
Addended by: Varney Daily on: 03/21/2014 01:16 PM   Modules accepted: Orders

## 2014-03-26 ENCOUNTER — Other Ambulatory Visit: Payer: Self-pay | Admitting: Family Medicine

## 2014-03-26 DIAGNOSIS — Z1231 Encounter for screening mammogram for malignant neoplasm of breast: Secondary | ICD-10-CM

## 2014-03-27 DIAGNOSIS — M1710 Unilateral primary osteoarthritis, unspecified knee: Secondary | ICD-10-CM | POA: Diagnosis not present

## 2014-03-27 DIAGNOSIS — F331 Major depressive disorder, recurrent, moderate: Secondary | ICD-10-CM | POA: Diagnosis not present

## 2014-03-27 DIAGNOSIS — M545 Low back pain, unspecified: Secondary | ICD-10-CM | POA: Diagnosis not present

## 2014-03-27 DIAGNOSIS — G8929 Other chronic pain: Secondary | ICD-10-CM | POA: Diagnosis not present

## 2014-03-28 ENCOUNTER — Ambulatory Visit (HOSPITAL_COMMUNITY): Payer: Medicare Other

## 2014-04-01 ENCOUNTER — Encounter: Payer: Self-pay | Admitting: Family Medicine

## 2014-04-01 ENCOUNTER — Ambulatory Visit (INDEPENDENT_AMBULATORY_CARE_PROVIDER_SITE_OTHER): Payer: Medicare Other | Admitting: Family Medicine

## 2014-04-01 VITALS — BP 112/80 | HR 79 | Temp 98.3°F | Ht 67.0 in | Wt 235.0 lb

## 2014-04-01 DIAGNOSIS — E039 Hypothyroidism, unspecified: Secondary | ICD-10-CM | POA: Diagnosis not present

## 2014-04-01 DIAGNOSIS — K219 Gastro-esophageal reflux disease without esophagitis: Secondary | ICD-10-CM

## 2014-04-01 DIAGNOSIS — G894 Chronic pain syndrome: Secondary | ICD-10-CM | POA: Diagnosis not present

## 2014-04-01 DIAGNOSIS — E669 Obesity, unspecified: Secondary | ICD-10-CM

## 2014-04-01 MED ORDER — PHENTERMINE HCL 15 MG PO CAPS: 15.0000 mg | ORAL_CAPSULE | ORAL | Status: DC

## 2014-04-01 NOTE — Patient Instructions (Signed)

## 2014-04-01 NOTE — Progress Notes (Signed)
Pre visit review using our clinic review tool, if applicable. No additional management support is needed unless otherwise documented below in the visit note. 

## 2014-04-06 NOTE — Assessment & Plan Note (Signed)
Pain continues to plague her daily her knees have been bad lately but she did recently get steroid shots in her knees which were marginally helpful but she has been told she has severe arthritis. She is encouraged to stay as active as tolerated and may apply topical OTC. Continue current meds prn

## 2014-04-06 NOTE — Assessment & Plan Note (Signed)
Encouraged DASH diet, decrease po intake and increase exercise as tolerated. Needs 7-8 hours of sleep nightly. Avoid trans fats, eat small, frequent meals every 4-5 hours with lean proteins, complex carbs and healthy fats. Minimize simple carbs, GMO foods. 

## 2014-04-06 NOTE — Assessment & Plan Note (Signed)
On Levothyroxine, continue to monitor 

## 2014-04-06 NOTE — Progress Notes (Signed)
Patient ID: Kayla [name], female   DOB: 05/02/1946, 68 y.o.   MRN: [id number] MITCHELL EPLING [id number] August 24, 1946 04/06/2014      Progress Note-Follow Up  Subjective  Chief Complaint  Chief Complaint  Patient presents with  . Follow-up    4 month    HPI  Patient is a 67 year old female in today for routine medical care.  She continues to struggle with chronic pain. It ha both arthritic and fibromyalgia pain. Notes her knees have been worse lately. She is following with orthopedics and pain management and notes recent steroid injections in her knees were marginally helpful. No recent or acute new pain. No recent illness  Denies CP/palp/SOB/HA/congestion/fevers/GI or GU c/o. Taking meds as prescribed  Past Medical History  Diagnosis Date  . Allergy     allergic rhinitis  . Anemia     NOS  . Depression   . GERD (gastroesophageal reflux disease)   . Hyperlipidemia   . Thyroid disease     hypothyroidism  . Arthritis     osteoarthritis  . Hx of colonic polyps   . History of diverticulitis of colon   . Headache(784.0)   . Urinary incontinence   . Migraine   . Fibromyalgia   . Miscarriage   . Depression with anxiety 01/19/2009    Qualifier: Diagnosis of  By: Redmond Pulling MD, Frann Rider    . Obesity, unspecified 10/26/2013  . Asthma     Past Surgical History  Procedure Laterality Date  . Cholecystectomy    . Abdominal hysterectomy    . Tonsillectomy      Family History  Problem Relation Age of Onset  . Cancer Other     female, brain, lung  . Heart disease Other   . Stroke Other   . Diabetes Other   . Arthritis Other   . Hyperlipidemia Other   . Hypertension Other   . Alzheimer's disease Mother   . Alzheimer's disease Father     History   Social History  . Marital Status: Divorced    Spouse Name: N/A    Number of Children: N/A  . Years of Education: N/A   Occupational History  . unemployed    Social History Main Topics  . Smoking status: Never Smoker   .  Smokeless tobacco: Never Used  . Alcohol Use: Yes  . Drug Use: Not on file  . Sexual Activity: Not on file   Other Topics Concern  . Not on file   Social History Narrative  . No narrative on file    Current Outpatient Prescriptions on File Prior to Visit  Medication Sig Dispense Refill  . carisoprodol (SOMA) 350 MG tablet Take 1 tablet (350 mg total) by mouth at bedtime as needed for muscle spasms. alternates with Flexeril  30 tablet  5  . clonazePAM (KLONOPIN) 1 MG tablet Take 1 tablet (1 mg total) by mouth 3 (three) times daily as needed for anxiety.  90 tablet  1  . cyclobenzaprine (FLEXERIL) 10 MG tablet Take 1 tablet by mouth at bedtime as needed. Alternate with SOMA.      . DULoxetine (CYMBALTA) 60 MG capsule Take 60 mg by mouth daily.      . Eszopiclone 3 MG TABS       . fluticasone (FLONASE) 50 MCG/ACT nasal spray Place 2 sprays into the nose daily.  16 g  6  . Fluticasone-Salmeterol (ADVAIR) 100-50 MCG/DOSE AEPB Inhale 1 puff into the lungs 2 (two) times  daily.  1 each  3  . gabapentin (NEURONTIN) 300 MG capsule Take 900 mg by mouth 3 (three) times daily.       . hyoscyamine (LEVSIN SL) 0.125 MG SL tablet Place 1 tablet (0.125 mg total) under the tongue every 4 (four) hours as needed.  30 tablet  1  . mirtazapine (REMERON) 15 MG tablet Take 1 tablet by mouth at bedtime.      . ondansetron (ZOFRAN) 4 MG tablet Take 1 tablet (4 mg total) by mouth every 8 (eight) hours as needed for nausea.  30 tablet  0  . ranitidine (ZANTAC) 300 MG tablet Take 1 tablet (300 mg total) by mouth at bedtime.  30 tablet  3  . Tapentadol HCl (NUCYNTA ER) 200 MG TB12 Take 1 tablet by mouth every 12 (twelve) hours.      Marland Kitchen thyroid (ARMOUR THYROID) 30 MG tablet Take 1 tablet (30 mg total) by mouth daily before breakfast.  90 tablet  1  . topiramate (TOPAMAX) 25 MG tablet TAKE 1 TABLET TWICE DAILY.  60 tablet  2  . betamethasone dipropionate (DIPROLENE) 0.05 % ointment as needed.       No current  facility-administered medications on file prior to visit.    Allergies  Allergen Reactions  . Adhesive [Tape]     blisters  . Penicillins     REACTION: rash  . Promethazine Hcl     Review of Systems  Review of Systems  Constitutional: Negative for fever and malaise/fatigue.  HENT: Negative for congestion.   Eyes: Negative for discharge.  Respiratory: Negative for shortness of breath.   Cardiovascular: Negative for chest pain, palpitations and leg swelling.  Gastrointestinal: Negative for nausea, abdominal pain and diarrhea.  Genitourinary: Negative for dysuria.  Musculoskeletal: Positive for back pain, joint pain, myalgias and neck pain. Negative for falls.  Skin: Negative for rash.  Neurological: Negative for loss of consciousness and headaches.  Endo/Heme/Allergies: Negative for polydipsia.  Psychiatric/Behavioral: Positive for depression. Negative for suicidal ideas. The patient is nervous/anxious. The patient does not have insomnia.     Objective  BP 112/80  Pulse 79  Temp(Src) 98.3 F (36.8 C) (Oral)  Ht 5\' 7"  (1.702 m)  Wt 235 lb (106.595 kg)  BMI 36.80 kg/m2  SpO2 96%  LMP 11/14/1978  Physical Exam  Physical Exam  Constitutional: She is oriented to person, place, and time and well-developed, well-nourished, and in no distress. No distress.  HENT:  Head: Normocephalic and atraumatic.  Eyes: Conjunctivae are normal.  Neck: Neck supple. No thyromegaly present.  Cardiovascular: Normal rate, regular rhythm and normal heart sounds.   Pulmonary/Chest: Effort normal and breath sounds normal. She has no wheezes.  Abdominal: She exhibits no distension and no mass.  Musculoskeletal: She exhibits no edema.  Lymphadenopathy:    She has no cervical adenopathy.  Neurological: She is alert and oriented to person, place, and time.  Skin: Skin is warm and dry. No rash noted. She is not diaphoretic.  Psychiatric: Memory, affect and judgment normal.    Lab Results   Component Value Date   TSH 1.116 10/25/2013   Lab Results  Component Value Date   WBC 8.7 12/27/2013   HGB 14.0 12/27/2013   HCT 41.4 12/27/2013   MCV 82.6 12/27/2013   PLT 280 12/27/2013   Lab Results  Component Value Date   CREATININE 0.97 12/27/2013   BUN 17 12/27/2013   NA 143 12/27/2013   K 4.5 12/27/2013   CL  104 12/27/2013   CO2 29 12/27/2013   Lab Results  Component Value Date   ALT 17 12/27/2013   AST 17 12/27/2013   ALKPHOS 72 12/27/2013   BILITOT 0.3 12/27/2013   Lab Results  Component Value Date   CHOL 271* 10/25/2013   Lab Results  Component Value Date   HDL 78 10/25/2013   Lab Results  Component Value Date   LDLCALC 173* 10/25/2013   Lab Results  Component Value Date   TRIG 102 10/25/2013   Lab Results  Component Value Date   CHOLHDL 3.5 10/25/2013     Assessment & Plan  GERD Avoid offending foods, start probiotics. Do not eat large meals in late evening and consider raising head of bed. Continue current meds.   Obesity, unspecified Encouraged DASH diet, decrease po intake and increase exercise as tolerated. Needs 7-8 hours of sleep nightly. Avoid trans fats, eat small, frequent meals every 4-5 hours with lean proteins, complex carbs and healthy fats. Minimize simple carbs, GMO foods.  HYPOTHYROIDISM On Levothyroxine, continue to monitor  Chronic pain syndrome Pain continues to plague her daily her knees have been bad lately but she did recently get steroid shots in her knees which were marginally helpful but she has been told she has severe arthritis. She is encouraged to stay as active as tolerated and may apply topical OTC. Continue current meds prn

## 2014-04-06 NOTE — Assessment & Plan Note (Signed)
Avoid offending foods, start probiotics. Do not eat large meals in late evening and consider raising head of bed. Continue current meds 

## 2014-04-10 ENCOUNTER — Ambulatory Visit (HOSPITAL_COMMUNITY)
Admission: RE | Admit: 2014-04-10 | Discharge: 2014-04-10 | Disposition: A | Discharge status: Home / Self Care | Payer: Medicare Other | Source: Ambulatory Visit | Attending: Family Medicine | Admitting: Family Medicine

## 2014-04-10 DIAGNOSIS — Z1231 Encounter for screening mammogram for malignant neoplasm of breast: Secondary | ICD-10-CM | POA: Diagnosis not present

## 2014-05-09 ENCOUNTER — Ambulatory Visit (INDEPENDENT_AMBULATORY_CARE_PROVIDER_SITE_OTHER): Payer: Medicare Other | Admitting: Family Medicine

## 2014-05-09 ENCOUNTER — Encounter: Payer: Self-pay | Admitting: Family Medicine

## 2014-05-09 VITALS — BP 112/82 | HR 98 | Temp 98.2°F | Ht 67.0 in | Wt 234.1 lb

## 2014-05-09 DIAGNOSIS — E039 Hypothyroidism, unspecified: Secondary | ICD-10-CM | POA: Diagnosis not present

## 2014-05-09 DIAGNOSIS — E669 Obesity, unspecified: Secondary | ICD-10-CM

## 2014-05-09 DIAGNOSIS — E785 Hyperlipidemia, unspecified: Secondary | ICD-10-CM | POA: Diagnosis not present

## 2014-05-09 DIAGNOSIS — K219 Gastro-esophageal reflux disease without esophagitis: Secondary | ICD-10-CM | POA: Diagnosis not present

## 2014-05-09 DIAGNOSIS — K449 Diaphragmatic hernia without obstruction or gangrene: Secondary | ICD-10-CM

## 2014-05-09 MED ORDER — LORCASERIN HCL 10 MG PO TABS: 10.0000 mg | ORAL_TABLET | Freq: Two times a day (BID) | ORAL | Status: DC

## 2014-05-09 NOTE — Progress Notes (Signed)
Pre visit review using our clinic review tool, if applicable. No additional management support is needed unless otherwise documented below in the visit note. 

## 2014-05-09 NOTE — Patient Instructions (Signed)
DASH Eating Plan  DASH stands for "Dietary Approaches to Stop Hypertension." The DASH eating plan is a healthy eating plan that has been shown to reduce high blood pressure (hypertension). Additional health benefits may include reducing the risk of type 2 diabetes mellitus, heart disease, and stroke. The DASH eating plan may also help with weight loss.  WHAT DO I NEED TO KNOW ABOUT THE DASH EATING PLAN?  For the DASH eating plan, you will follow these general guidelines:  · Choose foods with a percent daily value for sodium of less than 5% (as listed on the food label).  · Use salt-free seasonings or herbs instead of table salt or sea salt.  · Check with your health care provider or pharmacist before using salt substitutes.  · Eat lower-sodium products, often labeled as "lower sodium" or "no salt added."  · Eat fresh foods.  · Eat more vegetables, fruits, and low-fat dairy products.  · Choose whole grains. Look for the word "whole" as the first word in the ingredient list.  · Choose fish and skinless chicken or turkey more often than red meat. Limit fish, poultry, and meat to 6 oz (170 g) each day.  · Limit sweets, desserts, sugars, and sugary drinks.  · Choose heart-healthy fats.  · Limit cheese to 1 oz (28 g) per day.  · Eat more home-cooked food and less restaurant, buffet, and fast food.  · Limit fried foods.  · Cook foods using methods other than frying.  · Limit canned vegetables. If you do use them, rinse them well to decrease the sodium.  · When eating at a restaurant, ask that your food be prepared with less salt, or no salt if possible.  WHAT FOODS CAN I EAT?  Seek help from a dietitian for individual calorie needs.  Grains  Whole grain or whole wheat bread. Brown rice. Whole grain or whole wheat pasta. Quinoa, bulgur, and whole grain cereals. Low-sodium cereals. Corn or whole wheat flour tortillas. Whole grain cornbread. Whole grain crackers. Low-sodium crackers.  Vegetables  Fresh or frozen vegetables  (raw, steamed, roasted, or grilled). Low-sodium or reduced-sodium tomato and vegetable juices. Low-sodium or reduced-sodium tomato sauce and paste. Low-sodium or reduced-sodium canned vegetables.   Fruits  All fresh, canned (in natural juice), or frozen fruits.  Meat and Other Protein Products  Ground beef (85% or leaner), grass-fed beef, or beef trimmed of fat. Skinless chicken or turkey. Ground chicken or turkey. Pork trimmed of fat. All fish and seafood. Eggs. Dried beans, peas, or lentils. Unsalted nuts and seeds. Unsalted canned beans.  Dairy  Low-fat dairy products, such as skim or 1% milk, 2% or reduced-fat cheeses, low-fat ricotta or cottage cheese, or plain low-fat yogurt. Low-sodium or reduced-sodium cheeses.  Fats and Oils  Tub margarines without trans fats. Light or reduced-fat mayonnaise and salad dressings (reduced sodium). Avocado. Safflower, olive, or canola oils. Natural peanut or almond butter.  Other  Unsalted popcorn and pretzels.  The items listed above may not be a complete list of recommended foods or beverages. Contact your dietitian for more options.  WHAT FOODS ARE NOT RECOMMENDED?  Grains  White bread. White pasta. White rice. Refined cornbread. Bagels and croissants. Crackers that contain trans fat.  Vegetables  Creamed or fried vegetables. Vegetables in a cheese sauce. Regular canned vegetables. Regular canned tomato sauce and paste. Regular tomato and vegetable juices.  Fruits  Dried fruits. Canned fruit in light or heavy syrup. Fruit juice.  Meat and Other Protein   Products  Fatty cuts of meat. Ribs, chicken wings, bacon, sausage, bologna, salami, chitterlings, fatback, hot dogs, bratwurst, and packaged luncheon meats. Salted nuts and seeds. Canned beans with salt.  Dairy  Whole or 2% milk, cream, half-and-half, and cream cheese. Whole-fat or sweetened yogurt. Full-fat cheeses or blue cheese. Nondairy creamers and whipped toppings. Processed cheese, cheese spreads, or cheese  curds.  Condiments  Onion and garlic salt, seasoned salt, table salt, and sea salt. Canned and packaged gravies. Worcestershire sauce. Tartar sauce. Barbecue sauce. Teriyaki sauce. Soy sauce, including reduced sodium. Steak sauce. Fish sauce. Oyster sauce. Cocktail sauce. Horseradish. Ketchup and mustard. Meat flavorings and tenderizers. Bouillon cubes. Hot sauce. Tabasco sauce. Marinades. Taco seasonings. Relishes.  Fats and Oils  Butter, stick margarine, lard, shortening, ghee, and bacon fat. Coconut, palm kernel, or palm oils. Regular salad dressings.  Other  Pickles and olives. Salted popcorn and pretzels.  The items listed above may not be a complete list of foods and beverages to avoid. Contact your dietitian for more information.  WHERE CAN I FIND MORE INFORMATION?  National Heart, [employer], and [employer] Institute: www.nhlbi.nih.gov/health/health-topics/topics/dash/  Document Released: 10/20/2011 Document Revised: 11/05/2013 Document Reviewed: 09/04/2013  Exit[employer]® Patient Information ©2015 [employer]. This information is not intended to replace advice given to you by your health care provider. Make sure you discuss any questions you have with your health care provider.

## 2014-05-11 ENCOUNTER — Encounter: Payer: Self-pay | Admitting: Family Medicine

## 2014-05-11 NOTE — Assessment & Plan Note (Signed)
Avoid offending foods, start probiotics. Do not eat large meals in late evening and consider raising head of bed.  

## 2014-05-11 NOTE — Assessment & Plan Note (Addendum)
Encouraged DASH diet, decrease po intake and increase exercise as tolerated. Needs 7-8 hours of sleep nightly. Avoid trans fats, eat small, frequent meals every 4-5 hours with lean proteins, complex carbs and healthy fats. Minimize simple carbs, GMO foods. Tolerated Phentermine but no significant appetite suppression, is given a prescription for Belviq to try at her request

## 2014-05-11 NOTE — Progress Notes (Signed)
Patient ID: Kayla [name], female   DOB: [birth year], 68 y.o.   MRN: [id number] Kayla [name] [id number] 03-26-1946 05/11/2014      Progress Note-Follow Up  Subjective  Chief Complaint  Chief Complaint  Patient presents with  . Follow-up    1 month    HPI  Patient is a 68 year old female in today for routine medical care. She is in today in followup. She is tolerating phentermine without any concerning side effects but she has not had a significant decrease in her appetite. She denies headaches, worsening anxiety or insomnia. She denies chest pain, palpitations or GI concerns. No recent illness. No SOB, instruggling with increased anxiety due to her elderly aunt falling and requiring surery. She is now in a SNF for 2 months  Past Medical History  Diagnosis Date  . Allergy     allergic rhinitis  . Anemia     NOS  . Depression   . GERD (gastroesophageal reflux disease)   . Hyperlipidemia   . Thyroid disease     hypothyroidism  . Arthritis     osteoarthritis  . Hx of colonic polyps   . History of diverticulitis of colon   . Headache(784.0)   . Urinary incontinence   . Migraine   . Fibromyalgia   . Miscarriage   . Depression with anxiety 01/19/2009    Qualifier: Diagnosis of  By: Redmond Pulling MD, Frann Rider    . Obesity, unspecified 10/26/2013  . Asthma     Past Surgical History  Procedure Laterality Date  . Cholecystectomy    . Abdominal hysterectomy    . Tonsillectomy      Family History  Problem Relation Age of Onset  . Cancer Other     female, brain, lung  . Heart disease Other   . Stroke Other   . Diabetes Other   . Arthritis Other   . Hyperlipidemia Other   . Hypertension Other   . Alzheimer's disease Mother   . Alzheimer's disease Father     History   Social History  . Marital Status: Divorced    Spouse Name: N/A    Number of Children: N/A  . Years of Education: N/A   Occupational History  . unemployed    Social History Main Topics  . Smoking status:  Never Smoker   . Smokeless tobacco: Never Used  . Alcohol Use: Yes  . Drug Use: Not on file  . Sexual Activity: Not on file   Other Topics Concern  . Not on file   Social History Narrative  . No narrative on file    Current Outpatient Prescriptions on File Prior to Visit  Medication Sig Dispense Refill  . betamethasone dipropionate (DIPROLENE) 0.05 % ointment as needed.      . carisoprodol (SOMA) 350 MG tablet Take 1 tablet (350 mg total) by mouth at bedtime as needed for muscle spasms. alternates with Flexeril  30 tablet  5  . clonazePAM (KLONOPIN) 1 MG tablet Take 1 tablet (1 mg total) by mouth 3 (three) times daily as needed for anxiety.  90 tablet  1  . cyclobenzaprine (FLEXERIL) 10 MG tablet Take 1 tablet by mouth at bedtime as needed. Alternate with SOMA.      . DULoxetine (CYMBALTA) 60 MG capsule Take 60 mg by mouth daily.      . Eszopiclone 3 MG TABS       . fluticasone (FLONASE) 50 MCG/ACT nasal spray Place 2 sprays into the nose  daily.  16 g  6  . Fluticasone-Salmeterol (ADVAIR) 100-50 MCG/DOSE AEPB Inhale 1 puff into the lungs 2 (two) times daily.  1 each  3  . gabapentin (NEURONTIN) 300 MG capsule Take 900 mg by mouth 3 (three) times daily.       . hyoscyamine (LEVSIN SL) 0.125 MG SL tablet Place 1 tablet (0.125 mg total) under the tongue every 4 (four) hours as needed.  30 tablet  1  . mirtazapine (REMERON) 15 MG tablet Take 1 tablet by mouth at bedtime.      . ondansetron (ZOFRAN) 4 MG tablet Take 1 tablet (4 mg total) by mouth every 8 (eight) hours as needed for nausea.  30 tablet  0  . ranitidine (ZANTAC) 300 MG tablet Take 1 tablet (300 mg total) by mouth at bedtime.  30 tablet  3  . Tapentadol HCl (NUCYNTA ER) 200 MG TB12 Take 1 tablet by mouth every 12 (twelve) hours.      Marland Kitchen thyroid (ARMOUR THYROID) 30 MG tablet Take 1 tablet (30 mg total) by mouth daily before breakfast.  90 tablet  1  . topiramate (TOPAMAX) 25 MG tablet TAKE 1 TABLET TWICE DAILY.  60 tablet  2    No current facility-administered medications on file prior to visit.    Allergies  Allergen Reactions  . Adhesive [Tape]     blisters  . Penicillins     REACTION: rash  . Promethazine Hcl     Review of Systems  Review of Systems  Constitutional: Positive for malaise/fatigue. Negative for fever.  HENT: Negative for congestion.   Eyes: Negative for discharge.  Respiratory: Negative for shortness of breath.   Cardiovascular: Negative for chest pain, palpitations and leg swelling.  Gastrointestinal: Negative for nausea, abdominal pain and diarrhea.  Genitourinary: Negative for dysuria.  Musculoskeletal: Positive for joint pain. Negative for falls.  Skin: Negative for rash.  Neurological: Negative for loss of consciousness and headaches.  Endo/Heme/Allergies: Negative for polydipsia.  Psychiatric/Behavioral: Positive for depression. Negative for suicidal ideas. The patient is nervous/anxious. The patient does not have insomnia.     Objective  BP 112/82  Pulse 98  Temp(Src) 98.2 F (36.8 C) (Oral)  Ht 5\' 7"  (1.702 m)  Wt 234 lb 1.9 oz (106.196 kg)  BMI 36.66 kg/m2  SpO2 96%  LMP 11/14/1978  Physical Exam  Physical Exam  Constitutional: She is oriented to person, place, and time and well-developed, well-nourished, and in no distress. No distress.  HENT:  Head: Normocephalic and atraumatic.  Eyes: Conjunctivae are normal.  Neck: Neck supple. No thyromegaly present.  Cardiovascular: Normal rate, regular rhythm and normal heart sounds.   No murmur heard. Pulmonary/Chest: Effort normal and breath sounds normal. She has no wheezes.  Abdominal: She exhibits no distension and no mass.  Musculoskeletal: She exhibits no edema.  Lymphadenopathy:    She has no cervical adenopathy.  Neurological: She is alert and oriented to person, place, and time.  Skin: Skin is warm and dry. No rash noted. She is not diaphoretic.  Psychiatric: Memory, affect and judgment normal.     Lab Results  Component Value Date   TSH 1.116 10/25/2013   Lab Results  Component Value Date   WBC 8.7 12/27/2013   HGB 14.0 12/27/2013   HCT 41.4 12/27/2013   MCV 82.6 12/27/2013   PLT 280 12/27/2013   Lab Results  Component Value Date   CREATININE 0.97 12/27/2013   BUN 17 12/27/2013   NA 143 12/27/2013  K 4.5 12/27/2013   CL 104 12/27/2013   CO2 29 12/27/2013   Lab Results  Component Value Date   ALT 17 12/27/2013   AST 17 12/27/2013   ALKPHOS 72 12/27/2013   BILITOT 0.3 12/27/2013   Lab Results  Component Value Date   CHOL 271* 10/25/2013   Lab Results  Component Value Date   HDL 78 10/25/2013   Lab Results  Component Value Date   LDLCALC 173* 10/25/2013   Lab Results  Component Value Date   TRIG 102 10/25/2013   Lab Results  Component Value Date   CHOLHDL 3.5 10/25/2013     Assessment & Plan  HYPOTHYROIDISM On Levothyroxine, continue to monitor  Obesity, unspecified Encouraged DASH diet, decrease po intake and increase exercise as tolerated. Needs 7-8 hours of sleep nightly. Avoid trans fats, eat small, frequent meals every 4-5 hours with lean proteins, complex carbs and healthy fats. Minimize simple carbs, GMO foods. Tolerated Phentermine but no significant appetite suppression, is given a prescription for Belviq to try at her request  HYPERLIPIDEMIA Encouraged heart healthy diet, increase exercise, avoid trans fats, consider a krill oil cap daily  Hiatal hernia with gastroesophageal reflux Avoid offending foods, start probiotics. Do not eat large meals in late evening and consider raising head of bed.

## 2014-05-11 NOTE — Assessment & Plan Note (Signed)
On Levothyroxine, continue to monitor 

## 2014-05-11 NOTE — Assessment & Plan Note (Signed)
Encouraged heart healthy diet, increase exercise, avoid trans fats, consider a krill oil cap daily 

## 2014-05-27 DIAGNOSIS — F331 Major depressive disorder, recurrent, moderate: Secondary | ICD-10-CM | POA: Diagnosis not present

## 2014-05-27 DIAGNOSIS — M1710 Unilateral primary osteoarthritis, unspecified knee: Secondary | ICD-10-CM | POA: Diagnosis not present

## 2014-05-27 DIAGNOSIS — M6250 Muscle wasting and atrophy, not elsewhere classified, unspecified site: Secondary | ICD-10-CM | POA: Diagnosis not present

## 2014-05-27 DIAGNOSIS — G8929 Other chronic pain: Secondary | ICD-10-CM | POA: Diagnosis not present

## 2014-06-09 ENCOUNTER — Other Ambulatory Visit: Payer: Self-pay | Admitting: Family Medicine

## 2014-06-10 ENCOUNTER — Other Ambulatory Visit: Payer: Self-pay | Admitting: Family Medicine

## 2014-06-10 NOTE — Telephone Encounter (Signed)
Please advise refill? Last RX was done on 03-21-14 quantity 90 with 1 refill  If ok fax to [phone]

## 2014-06-16 ENCOUNTER — Other Ambulatory Visit: Payer: Self-pay | Admitting: Family Medicine

## 2014-06-19 ENCOUNTER — Telehealth: Payer: Self-pay | Admitting: Family Medicine

## 2014-06-19 ENCOUNTER — Ambulatory Visit: Payer: Medicare Other | Admitting: Family Medicine

## 2014-06-19 DIAGNOSIS — Z0289 Encounter for other administrative examinations: Secondary | ICD-10-CM

## 2014-06-19 NOTE — Telephone Encounter (Signed)
No show

## 2014-06-19 NOTE — Telephone Encounter (Signed)
Needs a follow up appt.

## 2014-06-28 ENCOUNTER — Other Ambulatory Visit: Payer: Self-pay | Admitting: Family Medicine

## 2014-07-24 DIAGNOSIS — G894 Chronic pain syndrome: Secondary | ICD-10-CM | POA: Diagnosis not present

## 2014-07-24 DIAGNOSIS — M545 Low back pain, unspecified: Secondary | ICD-10-CM | POA: Diagnosis not present

## 2014-07-24 DIAGNOSIS — F331 Major depressive disorder, recurrent, moderate: Secondary | ICD-10-CM | POA: Diagnosis not present

## 2014-07-24 DIAGNOSIS — M1710 Unilateral primary osteoarthritis, unspecified knee: Secondary | ICD-10-CM | POA: Diagnosis not present

## 2014-07-29 ENCOUNTER — Ambulatory Visit (INDEPENDENT_AMBULATORY_CARE_PROVIDER_SITE_OTHER): Payer: Medicare Other | Admitting: Family Medicine

## 2014-07-29 ENCOUNTER — Encounter: Payer: Self-pay | Admitting: Family Medicine

## 2014-07-29 VITALS — BP 124/80 | HR 87 | Temp 98.0°F | Ht 67.0 in | Wt 234.0 lb

## 2014-07-29 DIAGNOSIS — G500 Trigeminal neuralgia: Secondary | ICD-10-CM

## 2014-07-29 DIAGNOSIS — E669 Obesity, unspecified: Secondary | ICD-10-CM

## 2014-07-29 DIAGNOSIS — E039 Hypothyroidism, unspecified: Secondary | ICD-10-CM | POA: Diagnosis not present

## 2014-07-29 DIAGNOSIS — F341 Dysthymic disorder: Secondary | ICD-10-CM

## 2014-07-29 DIAGNOSIS — K219 Gastro-esophageal reflux disease without esophagitis: Secondary | ICD-10-CM

## 2014-07-29 DIAGNOSIS — R51 Headache: Secondary | ICD-10-CM

## 2014-07-29 DIAGNOSIS — F418 Other specified anxiety disorders: Secondary | ICD-10-CM

## 2014-07-29 DIAGNOSIS — E785 Hyperlipidemia, unspecified: Secondary | ICD-10-CM | POA: Diagnosis not present

## 2014-07-29 MED ORDER — PREDNISONE 20 MG PO TABS: 40.0000 mg | ORAL_TABLET | Freq: Every day | ORAL | Status: DC

## 2014-07-29 MED ORDER — PHENTERMINE HCL 30 MG PO CAPS: 30.0000 mg | ORAL_CAPSULE | ORAL | Status: DC

## 2014-07-29 NOTE — Progress Notes (Signed)
Pre visit review using our clinic review tool, if applicable. No additional management support is needed unless otherwise documented below in the visit note. 

## 2014-07-29 NOTE — Patient Instructions (Signed)
Will return in am for labs ordered tonight  Trigeminal Neuralgia Trigeminal neuralgia is a nerve disorder that causes sudden attacks of severe facial pain. It is caused by damage to the trigeminal nerve, a major nerve in the face. It is more common in women and in the elderly, although it can also happen in younger patients. Attacks last from a few seconds to several minutes and can occur from a couple of times per year to several times per day. Trigeminal neuralgia can be a very distressing and disabling condition. Surgery may be needed in very severe cases if medical treatment does not give relief. HOME CARE INSTRUCTIONS   If your caregiver prescribed medication to help prevent attacks, take as directed.  To help prevent attacks:  Chew on the unaffected side of the mouth.  Avoid touching your face.  Avoid blasts of hot or cold air.  Men may wish to grow a beard to avoid having to shave. SEEK IMMEDIATE MEDICAL CARE IF:  Pain is unbearable and your medicine does not help.  You develop new, unexplained symptoms (problems).  You have problems that may be related to a medication you are taking. Document Released: 10/28/2000 Document Revised: 01/23/2012 Document Reviewed: 08/28/2009 [facility] Patient Information 2015 [place], [place]. This information is not intended to replace advice given to you by your health care provider. Make sure you discuss any questions you have with your health care provider.

## 2014-07-30 ENCOUNTER — Other Ambulatory Visit: Payer: Medicare Other

## 2014-07-31 LAB — RENAL FUNCTION PANEL
Albumin: 3.9 g/dL (ref 3.5–5.2)
BUN: 16 mg/dL (ref 6–23)
CO2: 24 meq/L (ref 19–32)
CREATININE: 0.8 mg/dL (ref 0.4–1.2)
Calcium: 9.1 mg/dL (ref 8.4–10.5)
Chloride: 105 mEq/L (ref 96–112)
GFR: 72.63 mL/min (ref >60.00)
Glucose, Bld: 95 mg/dL (ref 70–99)
PHOSPHORUS: 3.6 mg/dL (ref 2.3–4.6)
Potassium: 4 mEq/L (ref 3.5–5.1)
Sodium: 139 mEq/L (ref 135–145)

## 2014-07-31 LAB — HEPATIC FUNCTION PANEL
ALBUMIN: 3.9 g/dL (ref 3.5–5.2)
ALT: 21 U/L (ref 0–35)
AST: 27 U/L (ref 0–37)
Alkaline Phosphatase: 66 U/L (ref 39–117)
BILIRUBIN TOTAL: 0.6 mg/dL (ref 0.2–1.2)
Bilirubin, Direct: 0 mg/dL (ref 0.0–0.3)
Total Protein: 7.4 g/dL (ref 6.0–8.3)

## 2014-07-31 LAB — SEDIMENTATION RATE: SED RATE: 21 mm/h (ref 0–22)

## 2014-07-31 LAB — LIPID PANEL
CHOLESTEROL: 245 mg/dL — AB (ref 0–200)
HDL: 60.4 mg/dL (ref >39.00)
LDL Cholesterol: 163 mg/dL — ABNORMAL HIGH (ref 0–99)
NonHDL: 184.6
TRIGLYCERIDES: 110 mg/dL (ref 0.0–149.0)
Total CHOL/HDL Ratio: 4
VLDL: 22 mg/dL (ref 0.0–40.0)

## 2014-07-31 LAB — TSH: TSH: 0.43 u[IU]/mL (ref 0.35–4.50)

## 2014-08-03 DIAGNOSIS — G500 Trigeminal neuralgia: Secondary | ICD-10-CM | POA: Insufficient documentation

## 2014-08-03 NOTE — Assessment & Plan Note (Signed)
Encouraged DASH diet, decrease po intake and increase exercise as tolerated. Needs 7-8 hours of sleep nightly. Avoid trans fats, eat small, frequent meals every 4-5 hours with lean proteins, complex carbs and healthy fats. Minimize simple carbs, GMO foods. 

## 2014-08-03 NOTE — Assessment & Plan Note (Signed)
Left ear pain, she describes as burning and stabbing. No hearing symptoms. Started on Gabapentin with slow increase in strength.

## 2014-08-03 NOTE — Progress Notes (Signed)
Patient ID: Kayla [name], female   DOB: [birth year], 68 y.o.   MRN: [id number] Kayla [name] [id number] [birth year] 08/03/2014      Progress Note-Follow Up  Subjective  Chief Complaint  Chief Complaint  Patient presents with  . Otitis Media    left ear- X off and on for months- worse the last 2 weeks, saw ENT last year (8 months ago)    HPI  Patient is a 68 year old female in today for routine medical care. She is in today complaining of left ear pain. It's been going on for months and comes and goes. She says to the lumbar region. At times the pain is 10 out of 10. And lancinating. No distress. No hearing changes or tinnitus. No for congestion. Denies CP/palp/SOB/HA/congestion/fevers/GI or GU c/o. Taking meds as prescribed  Past Medical History  Diagnosis Date  . Allergy     allergic rhinitis  . Anemia     NOS  . Depression   . GERD (gastroesophageal reflux disease)   . Hyperlipidemia   . Thyroid disease     hypothyroidism  . Arthritis     osteoarthritis  . Hx of colonic polyps   . History of diverticulitis of colon   . Headache(784.0)   . Urinary incontinence   . Migraine   . Fibromyalgia   . Miscarriage   . Depression with anxiety 01/19/2009    Qualifier: Diagnosis of  By: Redmond Pulling MD, Frann Rider    . Obesity, unspecified 10/26/2013  . Asthma     Past Surgical History  Procedure Laterality Date  . Cholecystectomy    . Abdominal hysterectomy    . Tonsillectomy      Family History  Problem Relation Age of Onset  . Cancer Other     female, brain, lung  . Heart disease Other   . Stroke Other   . Diabetes Other   . Arthritis Other   . Hyperlipidemia Other   . Hypertension Other   . Alzheimer's disease Mother   . Alzheimer's disease Father     History   Social History  . Marital Status: Divorced    Spouse Name: N/A    Number of Children: N/A  . Years of Education: N/A   Occupational History  . unemployed    Social History Main Topics  . Smoking status:  Never Smoker   . Smokeless tobacco: Never Used  . Alcohol Use: Yes  . Drug Use: Not on file  . Sexual Activity: Not on file   Other Topics Concern  . Not on file   Social History Narrative  . No narrative on file    Current Outpatient Prescriptions on File Prior to Visit  Medication Sig Dispense Refill  . ARMOUR THYROID 30 MG tablet TAKE 1 TABLET (30 MG TOTAL) BY MOUTH DAILY BEFORE BREAKFAST.  90 tablet  0  . carisoprodol (SOMA) 350 MG tablet Take 1 tablet (350 mg total) by mouth at bedtime as needed for muscle spasms. alternates with Flexeril  30 tablet  5  . clonazePAM (KLONOPIN) 1 MG tablet TAKE 1 TABLET BY MOUTH 3 TIMES A DAY  90 tablet  1  . cyclobenzaprine (FLEXERIL) 10 MG tablet Take 1 tablet by mouth at bedtime as needed. Alternate with SOMA.      . DULoxetine (CYMBALTA) 60 MG capsule Take 60 mg by mouth daily.      . Eszopiclone 3 MG TABS       . fluticasone (FLONASE) 50 MCG/ACT  nasal spray Place 2 sprays into the nose daily.  16 g  6  . Fluticasone-Salmeterol (ADVAIR) 100-50 MCG/DOSE AEPB Inhale 1 puff into the lungs 2 (two) times daily.  1 each  3  . gabapentin (NEURONTIN) 300 MG capsule Take 900 mg by mouth 3 (three) times daily.       . hyoscyamine (LEVSIN SL) 0.125 MG SL tablet Place 1 tablet (0.125 mg total) under the tongue every 4 (four) hours as needed.  30 tablet  1  . Lorcaserin HCl (BELVIQ) 10 MG TABS Take 10 mg by mouth 2 (two) times daily.  60 tablet  1  . mirtazapine (REMERON) 15 MG tablet Take 1 tablet by mouth at bedtime.      . ondansetron (ZOFRAN) 4 MG tablet Take 1 tablet (4 mg total) by mouth every 8 (eight) hours as needed for nausea.  30 tablet  0  . ranitidine (ZANTAC) 300 MG tablet Take 1 tablet (300 mg total) by mouth at bedtime.  30 tablet  3  . Tapentadol HCl (NUCYNTA ER) 200 MG TB12 Take 1 tablet by mouth every 12 (twelve) hours.      . topiramate (TOPAMAX) 25 MG tablet TAKE 1 TABLET TWICE DAILY.  60 tablet  2   No current facility-administered  medications on file prior to visit.    Allergies  Allergen Reactions  . Adhesive [Tape]     blisters  . Penicillins     REACTION: rash  . Promethazine Hcl     Review of Systems  Review of Systems  Constitutional: Negative for fever and malaise/fatigue.  HENT: Positive for ear pain. Negative for congestion, ear discharge, hearing loss and tinnitus.   Eyes: Negative for discharge.  Respiratory: Negative for shortness of breath.   Cardiovascular: Negative for chest pain, palpitations and leg swelling.  Gastrointestinal: Negative for nausea, abdominal pain and diarrhea.  Genitourinary: Negative for dysuria.  Musculoskeletal: Negative for falls.  Skin: Negative for rash.  Neurological: Negative for loss of consciousness and headaches.  Endo/Heme/Allergies: Negative for polydipsia.  Psychiatric/Behavioral: Negative for depression and suicidal ideas. The patient is not nervous/anxious and does not have insomnia.     Objective  BP 124/80  Pulse 87  Temp(Src) 98 F (36.7 C) (Oral)  Ht 5\' 7"  (1.702 m)  Wt 234 lb (106.142 kg)  BMI 36.64 kg/m2  SpO2 96%  LMP 11/14/1978  Physical Exam  Physical Exam  Constitutional: She is oriented to person, place, and time and well-developed, well-nourished, and in no distress. No distress.  HENT:  Head: Normocephalic and atraumatic.  Eyes: Conjunctivae are normal.  Neck: Neck supple. No thyromegaly present.  Cardiovascular: Normal rate, regular rhythm and normal heart sounds.   No murmur heard. Pulmonary/Chest: Effort normal and breath sounds normal. She has no wheezes.  Abdominal: She exhibits no distension and no mass.  Musculoskeletal: She exhibits no edema.  Lymphadenopathy:    She has no cervical adenopathy.  Neurological: She is alert and oriented to person, place, and time.  Skin: Skin is warm and dry. No rash noted. She is not diaphoretic.  Psychiatric: Memory, affect and judgment normal.    Lab Results  Component Value  Date   TSH 0.43 07/30/2014   Lab Results  Component Value Date   WBC 8.7 12/27/2013   HGB 14.0 12/27/2013   HCT 41.4 12/27/2013   MCV 82.6 12/27/2013   PLT 280 12/27/2013   Lab Results  Component Value Date   CREATININE 0.8 07/30/2014  BUN 16 07/30/2014   NA 139 07/30/2014   K 4.0 07/30/2014   CL 105 07/30/2014   CO2 24 07/30/2014   Lab Results  Component Value Date   ALT 21 07/30/2014   AST 27 07/30/2014   ALKPHOS 66 07/30/2014   BILITOT 0.6 07/30/2014   Lab Results  Component Value Date   CHOL 245* 07/30/2014   Lab Results  Component Value Date   HDL 60.40 07/30/2014   Lab Results  Component Value Date   LDLCALC 163* 07/30/2014   Lab Results  Component Value Date   TRIG 110.0 07/30/2014   Lab Results  Component Value Date   CHOLHDL 4 07/30/2014     Assessment & Plan  Trigeminal neuralgia Left ear pain, she describes as burning and stabbing. No hearing symptoms. Started on Gabapentin with slow increase in strength.   Obesity, unspecified Encouraged DASH diet, decrease po intake and increase exercise as tolerated. Needs 7-8 hours of sleep nightly. Avoid trans fats, eat small, frequent meals every 4-5 hours with lean proteins, complex carbs and healthy fats. Minimize simple carbs, GMO foods.  HYPOTHYROIDISM On thyroid med, continue to monitor  GERD Avoid offending foods, start probiotics. Do not eat large meals in late evening and consider raising head of bed.   Depression with anxiety Doing fairly well on current meds

## 2014-08-03 NOTE — Assessment & Plan Note (Signed)
Doing fairly well on current meds.

## 2014-08-03 NOTE — Assessment & Plan Note (Signed)
Avoid offending foods, start probiotics. Do not eat large meals in late evening and consider raising head of bed.  

## 2014-08-03 NOTE — Assessment & Plan Note (Signed)
On thyroid med, continue to monitor 

## 2014-08-29 ENCOUNTER — Other Ambulatory Visit: Payer: Self-pay | Admitting: Family Medicine

## 2014-09-01 NOTE — Telephone Encounter (Signed)
Last RX was done on 06-10-14 quantity 90 with 1 refill  RX printed for md to sign and fax

## 2014-09-02 ENCOUNTER — Ambulatory Visit: Payer: Medicare Other | Admitting: Family Medicine

## 2014-09-02 ENCOUNTER — Other Ambulatory Visit: Payer: Self-pay

## 2014-09-02 MED ORDER — TOPIRAMATE 25 MG PO TABS: 25.0000 mg | ORAL_TABLET | Freq: Two times a day (BID) | ORAL | Status: DC

## 2014-09-09 ENCOUNTER — Encounter: Payer: Self-pay | Admitting: Family Medicine

## 2014-09-09 ENCOUNTER — Ambulatory Visit (INDEPENDENT_AMBULATORY_CARE_PROVIDER_SITE_OTHER): Payer: Medicare Other | Admitting: Family Medicine

## 2014-09-09 VITALS — BP 134/91 | HR 79 | Temp 98.3°F | Ht 67.0 in | Wt 234.2 lb

## 2014-09-09 DIAGNOSIS — D649 Anemia, unspecified: Secondary | ICD-10-CM

## 2014-09-09 DIAGNOSIS — F418 Other specified anxiety disorders: Secondary | ICD-10-CM

## 2014-09-09 DIAGNOSIS — F329 Major depressive disorder, single episode, unspecified: Secondary | ICD-10-CM

## 2014-09-09 DIAGNOSIS — H9202 Otalgia, left ear: Secondary | ICD-10-CM | POA: Diagnosis not present

## 2014-09-09 DIAGNOSIS — R5383 Other fatigue: Secondary | ICD-10-CM

## 2014-09-09 DIAGNOSIS — E039 Hypothyroidism, unspecified: Secondary | ICD-10-CM

## 2014-09-09 DIAGNOSIS — K219 Gastro-esophageal reflux disease without esophagitis: Secondary | ICD-10-CM

## 2014-09-09 DIAGNOSIS — F32A Depression, unspecified: Secondary | ICD-10-CM

## 2014-09-09 DIAGNOSIS — E669 Obesity, unspecified: Secondary | ICD-10-CM

## 2014-09-09 DIAGNOSIS — E785 Hyperlipidemia, unspecified: Secondary | ICD-10-CM

## 2014-09-09 MED ORDER — DULOXETINE HCL 30 MG PO CPEP: 30.0000 mg | ORAL_CAPSULE | Freq: Every day | ORAL | Status: DC

## 2014-09-09 NOTE — Progress Notes (Signed)
Pre visit review using our clinic review tool, if applicable. No additional management support is needed unless otherwise documented below in the visit note. 

## 2014-09-12 ENCOUNTER — Other Ambulatory Visit: Payer: Self-pay | Admitting: Family Medicine

## 2014-09-14 NOTE — Progress Notes (Signed)
Kayla [name] [id number] [birth year] 09/14/2014      Progress Note-Follow Up  Subjective  Chief Complaint  Chief Complaint  Patient presents with  . Follow-up    4 week    HPI  Patient is a 68 year old female in today for routine medical care. She continues to struggle with significant diffuse pain, anxiety, depression and fatigue. Has increasing family stressors. Her sister is cheating on her brother in law and she is having a difficult with this situation. No recent acute illness but is c/o increased left ear pain, no hearing loss. She notes she sleeps 12 hours a day and wakes frequently.   Past Medical History  Diagnosis Date  . Allergy     allergic rhinitis  . Anemia     NOS  . Depression   . GERD (gastroesophageal reflux disease)   . Hyperlipidemia   . Thyroid disease     hypothyroidism  . Arthritis     osteoarthritis  . Hx of colonic polyps   . History of diverticulitis of colon   . Headache(784.0)   . Urinary incontinence   . Migraine   . Fibromyalgia   . Miscarriage   . Depression with anxiety 01/19/2009    Qualifier: Diagnosis of  By: Redmond Pulling MD, Frann Rider    . Obesity, unspecified 10/26/2013  . Asthma     Past Surgical History  Procedure Laterality Date  . Cholecystectomy    . Abdominal hysterectomy    . Tonsillectomy      Family History  Problem Relation Age of Onset  . Cancer Other     female, brain, lung  . Heart disease Other   . Stroke Other   . Diabetes Other   . Arthritis Other   . Hyperlipidemia Other   . Hypertension Other   . Alzheimer's disease Mother   . Alzheimer's disease Father     History   Social History  . Marital Status: Divorced    Spouse Name: N/A    Number of Children: N/A  . Years of Education: N/A   Occupational History  . unemployed    Social History Main Topics  . Smoking status: Never Smoker   . Smokeless tobacco: Never Used  . Alcohol Use: Yes  . Drug Use: Not on file  . Sexual Activity: Not on file    Other Topics Concern  . Not on file   Social History Narrative  . No narrative on file    Current Outpatient Prescriptions on File Prior to Visit  Medication Sig Dispense Refill  . carisoprodol (SOMA) 350 MG tablet Take 1 tablet (350 mg total) by mouth at bedtime as needed for muscle spasms. alternates with Flexeril 30 tablet 5  . clonazePAM (KLONOPIN) 1 MG tablet TAKE 1 TABLET BY MOUTH 3 TIMES A DAY 90 tablet 1  . cyclobenzaprine (FLEXERIL) 10 MG tablet Take 1 tablet by mouth at bedtime as needed. Alternate with SOMA.    . DULoxetine (CYMBALTA) 60 MG capsule Take 60 mg by mouth daily.    Marland Kitchen gabapentin (NEURONTIN) 300 MG capsule Take 900 mg by mouth 3 (three) times daily.     . mirtazapine (REMERON) 15 MG tablet Take 1 tablet by mouth at bedtime.    . ondansetron (ZOFRAN) 4 MG tablet Take 1 tablet (4 mg total) by mouth every 8 (eight) hours as needed for nausea. 30 tablet 0  . ranitidine (ZANTAC) 300 MG tablet Take 1 tablet (300 mg total) by mouth at bedtime.  30 tablet 3  . Tapentadol HCl (NUCYNTA ER) 200 MG TB12 Take 1 tablet by mouth every 12 (twelve) hours.    . topiramate (TOPAMAX) 25 MG tablet Take 1 tablet (25 mg total) by mouth 2 (two) times daily. 60 tablet 2  . Fluticasone-Salmeterol (ADVAIR) 100-50 MCG/DOSE AEPB Inhale 1 puff into the lungs 2 (two) times daily. 1 each 3  . hyoscyamine (LEVSIN SL) 0.125 MG SL tablet Place 1 tablet (0.125 mg total) under the tongue every 4 (four) hours as needed. 30 tablet 1   No current facility-administered medications on file prior to visit.    Allergies  Allergen Reactions  . Adhesive [Tape]     blisters  . Penicillins     REACTION: rash  . Promethazine Hcl     Review of Systems  Review of Systems  Constitutional: Positive for malaise/fatigue. Negative for fever.  HENT: Negative for congestion.   Eyes: Negative for discharge.  Respiratory: Negative for shortness of breath.   Cardiovascular: Positive for palpitations. Negative  for chest pain and leg swelling.  Gastrointestinal: Negative for nausea, abdominal pain and diarrhea.  Genitourinary: Negative for dysuria.  Musculoskeletal: Positive for myalgias and back pain. Negative for falls.  Skin: Negative for rash.  Neurological: Negative for loss of consciousness and headaches.  Endo/Heme/Allergies: Negative for polydipsia.  Psychiatric/Behavioral: Positive for depression. Negative for suicidal ideas. The patient is nervous/anxious. The patient does not have insomnia.     Objective  BP 134/91 mmHg  Pulse 79  Temp(Src) 98.3 F (36.8 C) (Oral)  Ht 5\' 7"  (1.702 m)  Wt 234 lb 3.2 oz (106.232 kg)  BMI 36.67 kg/m2  SpO2 98%  LMP 11/14/1978  Physical Exam  Physical Exam  Constitutional: She is oriented to person, place, and time and well-developed, well-nourished, and in no distress. No distress.  HENT:  Head: Normocephalic and atraumatic.  Eyes: Conjunctivae are normal.  Neck: Neck supple. No thyromegaly present.  Cardiovascular: Normal rate, regular rhythm and normal heart sounds.   No murmur heard. Pulmonary/Chest: Effort normal and breath sounds normal. She has no wheezes.  Abdominal: She exhibits no distension and no mass.  Musculoskeletal: She exhibits no edema.  Lymphadenopathy:    She has no cervical adenopathy.  Neurological: She is alert and oriented to person, place, and time.  Skin: Skin is warm and dry. No rash noted. She is not diaphoretic.  Psychiatric: Memory and judgment normal.  labile    Lab Results  Component Value Date   TSH 0.43 07/30/2014   Lab Results  Component Value Date   WBC 8.7 12/27/2013   HGB 14.0 12/27/2013   HCT 41.4 12/27/2013   MCV 82.6 12/27/2013   PLT 280 12/27/2013   Lab Results  Component Value Date   CREATININE 0.8 07/30/2014   BUN 16 07/30/2014   NA 139 07/30/2014   K 4.0 07/30/2014   CL 105 07/30/2014   CO2 24 07/30/2014   Lab Results  Component Value Date   ALT 21 07/30/2014   AST 27  07/30/2014   ALKPHOS 66 07/30/2014   BILITOT 0.6 07/30/2014   Lab Results  Component Value Date   CHOL 245* 07/30/2014   Lab Results  Component Value Date   HDL 60.40 07/30/2014   Lab Results  Component Value Date   LDLCALC 163* 07/30/2014   Lab Results  Component Value Date   TRIG 110.0 07/30/2014   Lab Results  Component Value Date   CHOLHDL 4 07/30/2014  Assessment & Plan  Obesity Encouraged DASH diet, decrease po intake and increase exercise as tolerated. Needs 7-8 hours of sleep nightly. Avoid trans fats, eat small, frequent meals every 4-5 hours with lean proteins, complex carbs and healthy fats. Minimize simple carbs, GMO foods. Stop Phentermine  Hypothyroidism .stable on current dose of Armour Thyroid, no changes  Hyperlipidemia Encouraged heart healthy diet, increase exercise, avoid trans fats, consider a krill oil cap daily  Depression with anxiety Struggling will try increasing Cymbalta by 30 mg daily  Anemia resolved  GERD Avoid offending foods, start probiotics. Do not eat large meals in late evening and consider raising head of bed.   Fatigue due to depression Doing well, no recent illness, appears to be related to stress and anxiety. Lawana Chambers

## 2014-09-14 NOTE — Assessment & Plan Note (Signed)
Doing well, no recent illness, appears to be related to stress and anxiety. Kayla [name]

## 2014-09-14 NOTE — Assessment & Plan Note (Addendum)
Encouraged DASH diet, decrease po intake and increase exercise as tolerated. Needs 7-8 hours of sleep nightly. Avoid trans fats, eat small, frequent meals every 4-5 hours with lean proteins, complex carbs and healthy fats. Minimize simple carbs, GMO foods. Stop Phentermine

## 2014-09-14 NOTE — Assessment & Plan Note (Signed)
Encouraged heart healthy diet, increase exercise, avoid trans fats, consider a krill oil cap daily 

## 2014-09-14 NOTE — Assessment & Plan Note (Signed)
.  stable on current dose of Armour Thyroid, no changes

## 2014-09-14 NOTE — Assessment & Plan Note (Signed)
Avoid offending foods, start probiotics. Do not eat large meals in late evening and consider raising head of bed.  

## 2014-09-14 NOTE — Assessment & Plan Note (Signed)
Struggling will try increasing Cymbalta by 30 mg daily

## 2014-09-14 NOTE — Assessment & Plan Note (Signed)
resolved 

## 2014-09-22 DIAGNOSIS — M1710 Unilateral primary osteoarthritis, unspecified knee: Secondary | ICD-10-CM | POA: Diagnosis not present

## 2014-09-22 DIAGNOSIS — M47816 Spondylosis without myelopathy or radiculopathy, lumbar region: Secondary | ICD-10-CM | POA: Diagnosis not present

## 2014-09-22 DIAGNOSIS — M545 Low back pain: Secondary | ICD-10-CM | POA: Diagnosis not present

## 2014-09-22 DIAGNOSIS — M791 Myalgia: Secondary | ICD-10-CM | POA: Diagnosis not present

## 2014-09-29 ENCOUNTER — Ambulatory Visit: Payer: Medicare Other | Admitting: Family Medicine

## 2014-10-06 ENCOUNTER — Ambulatory Visit (INDEPENDENT_AMBULATORY_CARE_PROVIDER_SITE_OTHER): Payer: Medicare Other | Admitting: Nurse Practitioner

## 2014-10-06 VITALS — BP 124/69 | HR 89 | Temp 98.3°F | Resp 18 | Ht 67.0 in | Wt 235.0 lb

## 2014-10-06 DIAGNOSIS — B372 Candidiasis of skin and nail: Secondary | ICD-10-CM

## 2014-10-06 MED ORDER — FLUTICASONE-SALMETEROL 45-21 MCG/ACT IN AERO: 2.0000 | INHALATION_SPRAY | Freq: Two times a day (BID) | RESPIRATORY_TRACT | Status: DC

## 2014-10-06 MED ORDER — FLUCONAZOLE 150 MG PO TABS: 150.0000 mg | ORAL_TABLET | Freq: Once | ORAL | Status: DC

## 2014-10-06 NOTE — Patient Instructions (Signed)
Take diflucan 1 tablet. 7 days later, take second tablet.  Dilute white vinegar 1:1 with water & gently wash red rash once daily. Pat dry thoroughly.  Please cut back refined sugar-it is likely contributing to your rash and you are increasing your risk of developing diabetes.  Reach for fresh fruits rather than candy.  Let us know if rash not improving-may take 14 days.  Cutaneous Candidiasis Cutaneous candidiasis is a condition in which there is an overgrowth of yeast (candida) on the skin. Yeast normally live on the skin, but in small enough numbers not to cause any symptoms. In certain cases, increased growth of the yeast may cause an actual yeast infection. This kind of infection usually occurs in areas of the skin that are constantly warm and moist, such as the armpits or the groin. Yeast is the most common cause of diaper rash in babies and in people who cannot control their bowel movements (incontinence). CAUSES  The fungus that most often causes cutaneous candidiasis is Candida albicans. Conditions that can increase the risk of getting a yeast infection of the skin include:  Obesity.  Pregnancy.  Diabetes.  Taking antibiotic medicine.  Taking birth control pills.  Taking steroid medicines.  Thyroid disease.  An iron or zinc deficiency.  Problems with the immune system. SYMPTOMS   Red, swollen area of the skin.  Bumps on the skin.  Itchiness. DIAGNOSIS  The diagnosis of cutaneous candidiasis is usually based on its appearance. Light scrapings of the skin may also be taken and viewed under a microscope to identify the presence of yeast. TREATMENT  Antifungal creams may be applied to the infected skin. In severe cases, oral medicines may be needed.  HOME CARE INSTRUCTIONS   Keep your skin clean and dry.  Maintain a healthy weight.  If you have diabetes, keep your blood sugar under control. SEEK IMMEDIATE MEDICAL CARE IF:  Your rash continues to spread despite  treatment.  You have a fever, chills, or abdominal pain. Document Released: 07/19/2011 Document Revised: 01/23/2012 Document Reviewed: 07/19/2011 [facility] Patient Information 2015 [place], [place]. This information is not intended to replace advice given to you by your health care provider. Make sure you discuss any questions you have with your health care provider.

## 2014-10-06 NOTE — Progress Notes (Signed)
Subjective:     Kayla [name] is a 68 y.o. female who presents for evaluation of a pruritic rash involving the suprapubic & groin areas. Rash started several days ago.Pt has not used anything on rash. She admits to eating a lot of candy over the last several months to comfort self-states her chair in her [name] is surrounded by reece's cups. She mentions her sister has accused her of committing suicide by Reece's Cups! Recent FBS was 95. She asks for asthma med that is not powder-she has fear of inhaling powder.   The following portions of the patient's history were reviewed and updated as appropriate: allergies, current medications, past medical history, past social history, past surgical history and problem list.  Review of Systems Constitutional: negative for fevers Genitourinary:positive for rash in groin, negative for vaginal discharge Integument/breast: positive for pruritus and rash    Objective:    BP 124/69 mmHg  Pulse 89  Temp(Src) 98.3 F (36.8 C) (Oral)  Resp 18  Ht 5\' 7"  (1.702 m)  Wt 235 lb (106.595 kg)  BMI 36.80 kg/m2  SpO2 98%  LMP 11/14/1978 General:  alert, cooperative, appears stated age and no distress  Skin:  normal and suprapubic rash in skin fold & in creases of inner thighs & groin. Red rash w/red satellite lesions on white base at periphery.     Assessment:     1. Cutaneous candidiasis - fluconazole (DIFLUCAN) 150 MG tablet; Take 1 tablet (150 mg total) by mouth once.  Dispense: 1 tablet; Refill: 1 Vinegar wash daily Cut back refined sugar-warned of increased risk for diabetes. Gave handout for nutrient dense diet. See patient instructions for complete plan. F/u PRN

## 2014-10-06 NOTE — Progress Notes (Signed)
Pre visit review using our clinic review tool, if applicable. No additional management support is needed unless otherwise documented below in the visit note. 

## 2014-10-08 ENCOUNTER — Other Ambulatory Visit: Payer: Self-pay

## 2014-11-18 ENCOUNTER — Other Ambulatory Visit: Payer: Self-pay | Admitting: Family Medicine

## 2014-11-20 ENCOUNTER — Encounter: Payer: Self-pay | Admitting: Family Medicine

## 2014-11-20 ENCOUNTER — Ambulatory Visit (INDEPENDENT_AMBULATORY_CARE_PROVIDER_SITE_OTHER): Payer: Medicare Other | Admitting: Family Medicine

## 2014-11-20 VITALS — BP 128/87 | HR 86 | Temp 98.0°F | Ht 67.0 in | Wt 236.6 lb

## 2014-11-20 DIAGNOSIS — E669 Obesity, unspecified: Secondary | ICD-10-CM

## 2014-11-20 DIAGNOSIS — H60399 Other infective otitis externa, unspecified ear: Secondary | ICD-10-CM | POA: Diagnosis not present

## 2014-11-20 DIAGNOSIS — R109 Unspecified abdominal pain: Secondary | ICD-10-CM

## 2014-11-20 DIAGNOSIS — E039 Hypothyroidism, unspecified: Secondary | ICD-10-CM

## 2014-11-20 DIAGNOSIS — R739 Hyperglycemia, unspecified: Secondary | ICD-10-CM

## 2014-11-20 DIAGNOSIS — K219 Gastro-esophageal reflux disease without esophagitis: Secondary | ICD-10-CM

## 2014-11-20 DIAGNOSIS — Z79899 Other long term (current) drug therapy: Secondary | ICD-10-CM | POA: Diagnosis not present

## 2014-11-20 DIAGNOSIS — E785 Hyperlipidemia, unspecified: Secondary | ICD-10-CM

## 2014-11-20 DIAGNOSIS — H60393 Other infective otitis externa, bilateral: Secondary | ICD-10-CM | POA: Diagnosis not present

## 2014-11-20 MED ORDER — CLONAZEPAM 1 MG PO TABS: 1.0000 mg | ORAL_TABLET | Freq: Three times a day (TID) | ORAL | Status: DC

## 2014-11-20 MED ORDER — NEOMYCIN-POLYMYXIN-HC 3.5-10000-1 OT SOLN: 3.0000 [drp] | Freq: Three times a day (TID) | OTIC | Status: DC

## 2014-11-20 NOTE — Progress Notes (Signed)
Pre visit review using our clinic review tool, if applicable. No additional management support is needed unless otherwise documented below in the visit note. 

## 2014-11-20 NOTE — Patient Instructions (Signed)

## 2014-11-21 LAB — LIPID PANEL
Cholesterol: 297 mg/dL — ABNORMAL HIGH (ref 0–200)
HDL: 63.5 mg/dL (ref >39.00)
LDL Cholesterol: 202 mg/dL — ABNORMAL HIGH (ref 0–99)
NonHDL: 233.5
Total CHOL/HDL Ratio: 5
Triglycerides: 157 mg/dL — ABNORMAL HIGH (ref 0.0–149.0)
VLDL: 31.4 mg/dL (ref 0.0–40.0)

## 2014-11-21 LAB — CBC
HCT: 46.2 % — ABNORMAL HIGH (ref 36.0–46.0)
HEMOGLOBIN: 14.9 g/dL (ref 12.0–15.0)
MCHC: 32.2 g/dL (ref 30.0–36.0)
MCV: 85.9 fl (ref 78.0–100.0)
PLATELETS: 297 10*3/uL (ref 150.0–400.0)
RBC: 5.38 Mil/uL — ABNORMAL HIGH (ref 3.87–5.11)
RDW: 13.3 % (ref 11.5–15.5)
WBC: 11.6 10*3/uL — ABNORMAL HIGH (ref 4.0–10.5)

## 2014-11-21 LAB — COMPREHENSIVE METABOLIC PANEL
ALBUMIN: 4.1 g/dL (ref 3.5–5.2)
ALT: 15 U/L (ref 0–35)
AST: 23 U/L (ref 0–37)
Alkaline Phosphatase: 86 U/L (ref 39–117)
BUN: 14 mg/dL (ref 6–23)
CHLORIDE: 106 meq/L (ref 96–112)
CO2: 26 mEq/L (ref 19–32)
Calcium: 9.8 mg/dL (ref 8.4–10.5)
Creatinine, Ser: 1 mg/dL (ref 0.4–1.2)
GFR: 61.34 mL/min (ref >60.00)
Glucose, Bld: 88 mg/dL (ref 70–99)
POTASSIUM: 5 meq/L (ref 3.5–5.1)
SODIUM: 145 meq/L (ref 135–145)
Total Bilirubin: 0.5 mg/dL (ref 0.2–1.2)
Total Protein: 7.9 g/dL (ref 6.0–8.3)

## 2014-11-21 LAB — TSH: TSH: 0.44 u[IU]/mL (ref 0.35–4.50)

## 2014-11-21 LAB — HEMOGLOBIN A1C: HEMOGLOBIN A1C: 5.7 % (ref 4.6–6.5)

## 2014-11-23 ENCOUNTER — Encounter: Payer: Self-pay | Admitting: Family Medicine

## 2014-11-23 DIAGNOSIS — R739 Hyperglycemia, unspecified: Secondary | ICD-10-CM

## 2014-11-23 DIAGNOSIS — H60399 Other infective otitis externa, unspecified ear: Secondary | ICD-10-CM | POA: Insufficient documentation

## 2014-11-23 HISTORY — DX: Other infective otitis externa, unspecified ear: H60.399

## 2014-11-23 HISTORY — DX: Hyperglycemia, unspecified: R73.9

## 2014-11-23 NOTE — Assessment & Plan Note (Signed)
On Levothyroxine, continue to monitor 

## 2014-11-23 NOTE — Assessment & Plan Note (Signed)
Started on Cortisporin otic

## 2014-11-23 NOTE — Assessment & Plan Note (Signed)
hgba1c acceptable, minimize simple carbs. Increase exercise as tolerated.  

## 2014-11-23 NOTE — Progress Notes (Signed)
Kayla [name]  [id number] 1946-04-11 11/23/2014      Progress Note-Follow Up  Subjective  Chief Complaint  Chief Complaint  Patient presents with  . Follow-up    10 weeks    HPI  Patient is a 69 y.o. female in today for routine medical care. Patient is in today for evaluation of ongoing medical concerns. She has been struggling with head congestion since the middle of December. She was improving but now is worsening again. Is complaining of some headache and head congestion. Has had ear pain worse on the left than the right. No fevers or chills. Has some mild left upper quadrant discomfort at times but no severe pain. No change in bowels or anorexia. Denies CP/palp/SOB/HA/congestion/fevers/GI or GU c/o. Taking meds as prescribed  Past Medical History  Diagnosis Date  . Allergy     allergic rhinitis  . Anemia     NOS  . Depression   . GERD (gastroesophageal reflux disease)   . Hyperlipidemia   . Thyroid disease     hypothyroidism  . Arthritis     osteoarthritis  . Hx of colonic polyps   . History of diverticulitis of colon   . Headache(784.0)   . Urinary incontinence   . Migraine   . Fibromyalgia   . Miscarriage   . Depression with anxiety 01/19/2009    Qualifier: Diagnosis of  By: Redmond Pulling MD, Frann Rider    . Obesity, unspecified 10/26/2013  . Asthma   . Hyperglycemia 11/23/2014  . Otitis, externa, infective 11/23/2014    Past Surgical History  Procedure Laterality Date  . Cholecystectomy    . Abdominal hysterectomy    . Tonsillectomy      Family History  Problem Relation Age of Onset  . Cancer Other     female, brain, lung  . Heart disease Other   . Stroke Other   . Diabetes Other   . Arthritis Other   . Hyperlipidemia Other   . Hypertension Other   . Alzheimer's disease Mother   . Alzheimer's disease Father     History   Social History  . Marital Status: Divorced    Spouse Name: N/A    Number of Children: N/A  . Years of Education: N/A    Occupational History  . unemployed    Social History Main Topics  . Smoking status: Never Smoker   . Smokeless tobacco: Never Used  . Alcohol Use: Yes  . Drug Use: Not on file  . Sexual Activity: Not on file   Other Topics Concern  . Not on file   Social History Narrative    Current Outpatient Prescriptions on File Prior to Visit  Medication Sig Dispense Refill  . ARMOUR THYROID 30 MG tablet TAKE 1 TABLET (30 MG TOTAL) BY MOUTH DAILY BEFORE BREAKFAST. 90 tablet 2  . carisoprodol (SOMA) 350 MG tablet Take 1 tablet (350 mg total) by mouth at bedtime as needed for muscle spasms. alternates with Flexeril 30 tablet 5  . cyclobenzaprine (FLEXERIL) 10 MG tablet Take 1 tablet by mouth at bedtime as needed. Alternate with SOMA.    . DULoxetine (CYMBALTA) 30 MG capsule Take 1 capsule (30 mg total) by mouth daily. Take the 60 mg tab in am and 30 mg tab in pm 30 capsule 3  . DULoxetine (CYMBALTA) 60 MG capsule Take 60 mg by mouth daily.    . fluticasone-salmeterol (ADVAIR HFA) 45-21 MCG/ACT inhaler Inhale 2 puffs into the lungs 2 (two) times daily.  1 Inhaler 12  . gabapentin (NEURONTIN) 300 MG capsule Take 900 mg by mouth 3 (three) times daily.     . hyoscyamine (LEVSIN SL) 0.125 MG SL tablet Place 1 tablet (0.125 mg total) under the tongue every 4 (four) hours as needed. 30 tablet 1  . mirtazapine (REMERON) 15 MG tablet Take 1 tablet by mouth at bedtime.    . ondansetron (ZOFRAN) 4 MG tablet Take 1 tablet (4 mg total) by mouth every 8 (eight) hours as needed for nausea. 30 tablet 0  . ranitidine (ZANTAC) 300 MG tablet Take 1 tablet (300 mg total) by mouth at bedtime. 30 tablet 3  . Tapentadol HCl (NUCYNTA ER) 200 MG TB12 Take 1 tablet by mouth every 12 (twelve) hours.    . topiramate (TOPAMAX) 25 MG tablet Take 1 tablet (25 mg total) by mouth 2 (two) times daily. 60 tablet 2   No current facility-administered medications on file prior to visit.    Allergies  Allergen Reactions  .  Adhesive [Tape]     blisters  . Penicillins     REACTION: rash  . Promethazine Hcl     Review of Systems  Review of Systems  Constitutional: Positive for malaise/fatigue. Negative for fever.  HENT: Positive for congestion and ear pain.   Eyes: Negative for discharge.  Respiratory: Negative for shortness of breath.   Cardiovascular: Negative for chest pain, palpitations and leg swelling.  Gastrointestinal: Positive for abdominal pain. Negative for nausea and diarrhea.  Genitourinary: Negative for dysuria.  Musculoskeletal: Positive for myalgias. Negative for falls.  Skin: Negative for rash.  Neurological: Positive for headaches. Negative for loss of consciousness.  Endo/Heme/Allergies: Negative for polydipsia.  Psychiatric/Behavioral: Positive for depression. Negative for suicidal ideas. The patient is nervous/anxious. The patient does not have insomnia.     Objective  BP 128/87 mmHg  Pulse 86  Temp(Src) 98 F (36.7 C) (Oral)  Ht 5\' 7"  (1.702 m)  Wt 236 lb 9.6 oz (107.321 kg)  BMI 37.05 kg/m2  SpO2 96%  LMP 11/14/1978  Physical Exam  Physical Exam  Constitutional: She is oriented to person, place, and time and well-developed, well-nourished, and in no distress. No distress.  HENT:  Head: Normocephalic and atraumatic.  External canals mildly erythematous with small amount of wax.   Eyes: Conjunctivae are normal.  Neck: Neck supple. No thyromegaly present.  Cardiovascular: Normal rate, regular rhythm and normal heart sounds.   No murmur heard. Pulmonary/Chest: Effort normal and breath sounds normal. She has no wheezes.  Abdominal: She exhibits no distension and no mass.  Musculoskeletal: She exhibits no edema.  Lymphadenopathy:    She has no cervical adenopathy.  Neurological: She is alert and oriented to person, place, and time.  Skin: Skin is warm and dry. No rash noted. She is not diaphoretic.  Psychiatric: Memory, affect and judgment normal.    Lab Results   Component Value Date   TSH 0.44 11/20/2014   Lab Results  Component Value Date   WBC 11.6* 11/20/2014   HGB 14.9 11/20/2014   HCT 46.2* 11/20/2014   MCV 85.9 11/20/2014   PLT 297.0 11/20/2014   Lab Results  Component Value Date   CREATININE 1.0 11/20/2014   BUN 14 11/20/2014   NA 145 11/20/2014   K 5.0 11/20/2014   CL 106 11/20/2014   CO2 26 11/20/2014   Lab Results  Component Value Date   ALT 15 11/20/2014   AST 23 11/20/2014   ALKPHOS 86 11/20/2014  BILITOT 0.5 11/20/2014   Lab Results  Component Value Date   CHOL 297* 11/20/2014   Lab Results  Component Value Date   HDL 63.50 11/20/2014   Lab Results  Component Value Date   LDLCALC 202* 11/20/2014   Lab Results  Component Value Date   TRIG 157.0* 11/20/2014   Lab Results  Component Value Date   CHOLHDL 5 11/20/2014     Assessment & Plan  GERD Avoid offending foods, start probiotics. Do not eat large meals in late evening and consider raising head of bed.   Hypothyroidism On Levothyroxine, continue to monitor  Obesity Encouraged DASH diet, decrease po intake and increase exercise as tolerated. Needs 7-8 hours of sleep nightly. Avoid trans fats, eat small, frequent meals every 4-5 hours with lean proteins, complex carbs and healthy fats. Minimize simple carbs, GMO foods.  Hyperglycemia hgba1c acceptable, minimize simple carbs. Increase exercise as tolerated.   Otitis, externa, infective Started on Cortisporin otic

## 2014-11-23 NOTE — Assessment & Plan Note (Signed)
Encouraged DASH diet, decrease po intake and increase exercise as tolerated. Needs 7-8 hours of sleep nightly. Avoid trans fats, eat small, frequent meals every 4-5 hours with lean proteins, complex carbs and healthy fats. Minimize simple carbs, GMO foods. 

## 2014-11-23 NOTE — Assessment & Plan Note (Signed)
Avoid offending foods, start probiotics. Do not eat large meals in late evening and consider raising head of bed.  

## 2014-11-27 DIAGNOSIS — M791 Myalgia: Secondary | ICD-10-CM | POA: Diagnosis not present

## 2014-11-27 DIAGNOSIS — M545 Low back pain: Secondary | ICD-10-CM | POA: Diagnosis not present

## 2014-11-27 DIAGNOSIS — M1710 Unilateral primary osteoarthritis, unspecified knee: Secondary | ICD-10-CM | POA: Diagnosis not present

## 2014-11-27 DIAGNOSIS — M4696 Unspecified inflammatory spondylopathy, lumbar region: Secondary | ICD-10-CM | POA: Diagnosis not present

## 2014-12-15 ENCOUNTER — Telehealth: Payer: Self-pay | Admitting: *Deleted

## 2014-12-15 ENCOUNTER — Other Ambulatory Visit: Payer: Medicare Other

## 2014-12-15 NOTE — Telephone Encounter (Signed)
Patient calling regarding lab results.  Notified patient of lab results.  Stated understanding.  Lab visit scheduled for 12/16/14 at 3pm.    Patient notified of WBC and stated "I hope I do have an infection, because I feel terrible."  Does not have any congestion, but does report pain in "belly button" and back.  States that her urine is sometimes cloudy.    She is agreeable to starting the Atorvastatin.    Patient also states that her Armour will no longer be covered by insurance and is requesting a bio-identical medication.    Please advise.  eal

## 2014-12-15 NOTE — Telephone Encounter (Signed)
-----   Message from Mosie Lukes, MD sent at 11/21/2014 10:37 PM EST ----- Labs show higher cholesterol, minimize simple carbs and saturated fats, it is hi enough we should start a statin such as Atorvastatin 10 mg daily if she is willing. WBC up slightly. Recheck cbc in 1 week

## 2014-12-15 NOTE — Progress Notes (Signed)
Patient returned call.  Notified of lab results.  Patient stated understanding.  Labs ordered and lab visit scheduled for 12/16/14 at 3pm.  eal

## 2014-12-15 NOTE — Progress Notes (Signed)
Patient returned call.  Notified of lab results. Patient stated understanding.  CBC scheduled for 12/16/14 at 3:00.  eal

## 2014-12-15 NOTE — Telephone Encounter (Signed)
If she is worried about urinary symptoms can run a UA with c&s. I can switch her from Armour thyroid to Levothyroxine but I do not have experience with any other bioidenticals so I cannot switch her to anything else.

## 2014-12-16 ENCOUNTER — Other Ambulatory Visit: Payer: Medicare Other

## 2014-12-16 NOTE — Telephone Encounter (Signed)
Left message on voicemail for patient to return call. eal

## 2014-12-17 ENCOUNTER — Other Ambulatory Visit (INDEPENDENT_AMBULATORY_CARE_PROVIDER_SITE_OTHER): Payer: Medicare Other

## 2014-12-17 DIAGNOSIS — R5383 Other fatigue: Secondary | ICD-10-CM | POA: Diagnosis not present

## 2014-12-17 LAB — CBC WITH DIFFERENTIAL/PLATELET
Basophils Absolute: 0 10*3/uL (ref 0.0–0.1)
Basophils Relative: 0 % (ref 0–1)
Eosinophils Absolute: 0 10*3/uL (ref 0.0–0.7)
Eosinophils Relative: 0 % (ref 0–5)
HEMATOCRIT: 43.9 % (ref 36.0–46.0)
Hemoglobin: 14.7 g/dL (ref 12.0–15.0)
LYMPHS ABS: 2.7 10*3/uL (ref 0.7–4.0)
LYMPHS PCT: 31 % (ref 12–46)
MCH: 28.2 pg (ref 26.0–34.0)
MCHC: 33.5 g/dL (ref 30.0–36.0)
MCV: 84.1 fL (ref 78.0–100.0)
MPV: 9.4 fL (ref 8.6–12.4)
Monocytes Absolute: 0.6 10*3/uL (ref 0.1–1.0)
Monocytes Relative: 7 % (ref 3–12)
NEUTROS ABS: 5.3 10*3/uL (ref 1.7–7.7)
Neutrophils Relative %: 62 % (ref 43–77)
Platelets: 316 10*3/uL (ref 150–400)
RBC: 5.22 MIL/uL — AB (ref 3.87–5.11)
RDW: 13.1 % (ref 11.5–15.5)
WBC: 8.6 10*3/uL (ref 4.0–10.5)

## 2014-12-17 NOTE — Addendum Note (Signed)
Addended by: Peggyann Shoals on: 12/17/2014 03:15 PM   Modules accepted: Orders

## 2014-12-17 NOTE — Telephone Encounter (Signed)
Left message for patient to return call.  eal

## 2014-12-18 LAB — URINALYSIS
Bilirubin Urine: NEGATIVE
Glucose, UA: NEGATIVE mg/dL
Hgb urine dipstick: NEGATIVE
KETONES UR: NEGATIVE mg/dL
Leukocytes, UA: NEGATIVE
Nitrite: NEGATIVE
Protein, ur: NEGATIVE mg/dL
SPECIFIC GRAVITY, URINE: 1.019 (ref 1.005–1.030)
UROBILINOGEN UA: 0.2 mg/dL (ref 0.0–1.0)
pH: 5.5 (ref 5.0–8.0)

## 2014-12-18 NOTE — Telephone Encounter (Signed)
D/C her armour thyroid and start Synthroid at 7 mcg tab 1 tab po daily, disp #30 with 3 and recheck tsh in 10-12 weeks

## 2014-12-18 NOTE — Telephone Encounter (Signed)
Patient would like to switch to Synthroid (see note below).  eal

## 2014-12-19 LAB — URINE CULTURE
Colony Count: NO GROWTH
Organism ID, Bacteria: NO GROWTH

## 2014-12-19 MED ORDER — LEVOTHYROXINE SODIUM 75 MCG PO TABS: 75.0000 ug | ORAL_TABLET | Freq: Every day | ORAL | Status: DC

## 2014-12-19 MED ORDER — ATORVASTATIN CALCIUM 10 MG PO TABS: 10.0000 mg | ORAL_TABLET | Freq: Every day | ORAL | Status: DC

## 2014-12-19 NOTE — Telephone Encounter (Signed)
Synthroid and Atorvastatin Rx sent to [facility] on [address] per patient preference.  Called patient to make lab appointment and left message on voicemail.  eal

## 2014-12-19 NOTE — Addendum Note (Signed)
Addended by: Leticia Penna A on: 12/19/2014 09:51 AM   Modules accepted: Orders, Medications

## 2014-12-19 NOTE — Telephone Encounter (Signed)
Patient repeat labs scheduled 03/13/15 per patient preference.    Patient requesting change from Klonopin to Amitriptyline.  She states she has become tolerant of the Klonopin and thinks the Amitriptyline will help her to get some relief/sleep.   Please advise.

## 2014-12-20 NOTE — Telephone Encounter (Signed)
This requires an appt to discuss since they are controlled meds.

## 2014-12-22 NOTE — Telephone Encounter (Signed)
Notified patient.  Appointment made for 01/27/15 at 5.  eal

## 2015-01-10 ENCOUNTER — Other Ambulatory Visit: Payer: Self-pay | Admitting: Family Medicine

## 2015-01-27 ENCOUNTER — Ambulatory Visit (INDEPENDENT_AMBULATORY_CARE_PROVIDER_SITE_OTHER): Payer: Medicare Other | Admitting: Family Medicine

## 2015-01-27 ENCOUNTER — Encounter: Payer: Self-pay | Admitting: Family Medicine

## 2015-01-27 VITALS — BP 120/82 | HR 101 | Temp 98.4°F | Ht 67.0 in | Wt 230.5 lb

## 2015-01-27 DIAGNOSIS — E782 Mixed hyperlipidemia: Secondary | ICD-10-CM

## 2015-01-27 DIAGNOSIS — M5489 Other dorsalgia: Secondary | ICD-10-CM

## 2015-01-27 DIAGNOSIS — G894 Chronic pain syndrome: Secondary | ICD-10-CM | POA: Diagnosis not present

## 2015-01-27 DIAGNOSIS — K219 Gastro-esophageal reflux disease without esophagitis: Secondary | ICD-10-CM | POA: Diagnosis not present

## 2015-01-27 DIAGNOSIS — R32 Unspecified urinary incontinence: Secondary | ICD-10-CM

## 2015-01-27 DIAGNOSIS — F418 Other specified anxiety disorders: Secondary | ICD-10-CM

## 2015-01-27 DIAGNOSIS — E669 Obesity, unspecified: Secondary | ICD-10-CM | POA: Diagnosis not present

## 2015-01-27 DIAGNOSIS — E039 Hypothyroidism, unspecified: Secondary | ICD-10-CM | POA: Diagnosis not present

## 2015-01-27 DIAGNOSIS — K449 Diaphragmatic hernia without obstruction or gangrene: Secondary | ICD-10-CM | POA: Diagnosis not present

## 2015-01-27 LAB — POCT URINALYSIS DIPSTICK
Bilirubin, UA: NEGATIVE
Blood, UA: NEGATIVE
Glucose, UA: NEGATIVE
Ketones, UA: NEGATIVE
Leukocytes, UA: NEGATIVE
NITRITE UA: NEGATIVE
PROTEIN UA: NEGATIVE
SPEC GRAV UA: 1.02
UROBILINOGEN UA: NEGATIVE
pH, UA: 7

## 2015-01-27 NOTE — Patient Instructions (Signed)
Back Pain, Adult Low back pain is very common. About 1 in 5 people have back pain.The cause of low back pain is rarely dangerous. The pain often gets better over time.About half of people with a sudden onset of back pain feel better in just 2 weeks. About 8 in 10 people feel better by 6 weeks.  CAUSES Some common causes of back pain include:  Strain of the muscles or ligaments supporting the spine.  Wear and tear (degeneration) of the spinal discs.  Arthritis.  Direct injury to the back. DIAGNOSIS Most of the time, the direct cause of low back pain is not known.However, back pain can be treated effectively even when the exact cause of the pain is unknown.Answering your caregiver's questions about your overall health and symptoms is one of the most accurate ways to make sure the cause of your pain is not dangerous. If your caregiver needs more information, he or she may order lab work or imaging tests (X-rays or MRIs).However, even if imaging tests show changes in your back, this usually does not require surgery. HOME CARE INSTRUCTIONS For many people, back pain returns.Since low back pain is rarely dangerous, it is often a condition that people can learn to manageon their own.   Remain active. It is stressful on the back to sit or stand in one place. Do not sit, drive, or stand in one place for more than 30 minutes at a time. Take short walks on level surfaces as soon as pain allows.Try to increase the length of time you walk each day.  Do not stay in bed.Resting more than 1 or 2 days can delay your recovery.  Do not avoid exercise or work.Your body is made to move.It is not dangerous to be active, even though your back may hurt.Your back will likely heal faster if you return to being active before your pain is gone.  Pay attention to your body when you bend and lift. Many people have less discomfortwhen lifting if they bend their knees, keep the load close to their bodies,and  avoid twisting. Often, the most comfortable positions are those that put less stress on your recovering back.  Find a comfortable position to sleep. Use a firm mattress and lie on your side with your knees slightly bent. If you lie on your back, put a pillow under your knees.  Only take over-the-counter or prescription medicines as directed by your caregiver. Over-the-counter medicines to reduce pain and inflammation are often the most helpful.Your caregiver may prescribe muscle relaxant drugs.These medicines help dull your pain so you can more quickly return to your normal activities and healthy exercise.  Put ice on the injured area.  Put ice in a plastic bag.  Place a towel between your skin and the bag.  Leave the ice on for 15-20 minutes, 03-04 times a day for the first 2 to 3 days. After that, ice and heat may be alternated to reduce pain and spasms.  Ask your caregiver about trying back exercises and gentle massage. This may be of some benefit.  Avoid feeling anxious or stressed.Stress increases muscle tension and can worsen back pain.It is important to recognize when you are anxious or stressed and learn ways to manage it.Exercise is a great option. SEEK MEDICAL CARE IF:  You have pain that is not relieved with rest or medicine.  You have pain that does not improve in 1 week.  You have new symptoms.  You are generally not feeling well. SEEK   IMMEDIATE MEDICAL CARE IF:   You have pain that radiates from your back into your legs.  You develop new bowel or bladder control problems.  You have unusual weakness or numbness in your arms or legs.  You develop nausea or vomiting.  You develop abdominal pain.  You feel faint. Document Released: 10/31/2005 Document Revised: 05/01/2012 Document Reviewed: 03/04/2014 Exit[employer] Patient Information 2015 [employer]. This information is not intended to replace advice given to you by your health care provider. Make sure you  discuss any questions you have with your health care provider.  

## 2015-01-27 NOTE — Progress Notes (Signed)
Pre visit review using our clinic review tool, if applicable. No additional management support is needed unless otherwise documented below in the visit note. 

## 2015-02-01 NOTE — Assessment & Plan Note (Signed)
Continues to struggle with chronic low back and knee pain, just suffered a fall onto her right knee but the pain is improving. No change in meds encouraged to stay as active as tolerated.

## 2015-02-01 NOTE — Assessment & Plan Note (Signed)
Patient very anxious today about health concerns. Offered reassurance no concerns on exam today, no change in meds.

## 2015-02-01 NOTE — Progress Notes (Signed)
Kayla [name]  [id number] January 28, 1946 02/01/2015      Progress Note-Follow Up  Subjective  Chief Complaint  Chief Complaint  Patient presents with  . Medication Problem    HPI  Patient is a 69 y.o. female in today for routine medical care. Patient in today with numerous concerns. First she notes cloudy urine for about 1 day. No dysuria or hematuria. Has low back pain but it is persistent and not worsening. Has knee pain. Did fall on right knee recently but it is improving. No fevers or chills. Acknowledges worsening anxiety but no suicidal ideation. Denies CP/palp/HA/fevers/GI or GU c/o. Taking meds as prescribed  Past Medical History  Diagnosis Date  . Allergy     allergic rhinitis  . Anemia     NOS  . Depression   . GERD (gastroesophageal reflux disease)   . Hyperlipidemia   . Thyroid disease     hypothyroidism  . Arthritis     osteoarthritis  . Hx of colonic polyps   . History of diverticulitis of colon   . Headache(784.0)   . Urinary incontinence   . Migraine   . Fibromyalgia   . Miscarriage   . Depression with anxiety 01/19/2009    Qualifier: Diagnosis of  By: Redmond Pulling MD, Frann Rider    . Obesity, unspecified 10/26/2013  . Asthma   . Hyperglycemia 11/23/2014  . Otitis, externa, infective 11/23/2014    Past Surgical History  Procedure Laterality Date  . Cholecystectomy    . Abdominal hysterectomy    . Tonsillectomy      Family History  Problem Relation Age of Onset  . Cancer Other     female, brain, lung  . Heart disease Other   . Stroke Other   . Diabetes Other   . Arthritis Other   . Hyperlipidemia Other   . Hypertension Other   . Alzheimer's disease Mother   . Alzheimer's disease Father     History   Social History  . Marital Status: Divorced    Spouse Name: N/A  . Number of Children: N/A  . Years of Education: N/A   Occupational History  . unemployed    Social History Main Topics  . Smoking status: Never Smoker   . Smokeless tobacco:  Never Used  . Alcohol Use: Yes  . Drug Use: Not on file  . Sexual Activity: Not on file   Other Topics Concern  . Not on file   Social History Narrative    Current Outpatient Prescriptions on File Prior to Visit  Medication Sig Dispense Refill  . carisoprodol (SOMA) 350 MG tablet Take 1 tablet (350 mg total) by mouth at bedtime as needed for muscle spasms. alternates with Flexeril 30 tablet 5  . clonazePAM (KLONOPIN) 1 MG tablet Take 1 tablet (1 mg total) by mouth 3 (three) times daily. 90 tablet 4  . cyclobenzaprine (FLEXERIL) 10 MG tablet Take 1 tablet by mouth at bedtime as needed. Alternate with SOMA.    . DULoxetine (CYMBALTA) 60 MG capsule Take 60 mg by mouth daily.    . fluticasone-salmeterol (ADVAIR HFA) 45-21 MCG/ACT inhaler Inhale 2 puffs into the lungs 2 (two) times daily. 1 Inhaler 12  . gabapentin (NEURONTIN) 300 MG capsule Take 900 mg by mouth 3 (three) times daily.     . hyoscyamine (LEVSIN SL) 0.125 MG SL tablet Place 1 tablet (0.125 mg total) under the tongue every 4 (four) hours as needed. 30 tablet 1  . levothyroxine (SYNTHROID, LEVOTHROID)  75 MCG tablet Take 1 tablet (75 mcg total) by mouth daily. 30 tablet 3  . mirtazapine (REMERON) 15 MG tablet Take 2 tablet by mouth at bedtime.    Marland Kitchen neomycin-polymyxin-hydrocortisone (CORTISPORIN) otic solution Place 3 drops into both ears 3 (three) times daily. 10 mL 0  . ondansetron (ZOFRAN) 4 MG tablet Take 1 tablet (4 mg total) by mouth every 8 (eight) hours as needed for nausea. 30 tablet 0  . ranitidine (ZANTAC) 300 MG tablet Take 1 tablet (300 mg total) by mouth at bedtime. 30 tablet 3  . Tapentadol HCl (NUCYNTA ER) 200 MG TB12 Take 1 tablet by mouth every 12 (twelve) hours.    . topiramate (TOPAMAX) 25 MG tablet TAKE 1 TABLET (25 MG TOTAL) BY MOUTH 2 (TWO) TIMES DAILY. 60 tablet 2  . atorvastatin (LIPITOR) 10 MG tablet Take 1 tablet (10 mg total) by mouth daily. (Patient not taking: Reported on 01/27/2015) 30 tablet 3  .  DULoxetine (CYMBALTA) 30 MG capsule Take 1 capsule (30 mg total) by mouth daily. Take the 60 mg tab in am and 30 mg tab in pm 30 capsule 3   No current facility-administered medications on file prior to visit.    Allergies  Allergen Reactions  . Adhesive [Tape]     blisters  . Penicillins     REACTION: rash  . Promethazine Hcl     Review of Systems  Review of Systems  Constitutional: Positive for malaise/fatigue. Negative for fever.  HENT: Positive for congestion.   Eyes: Negative for discharge.  Respiratory: Negative for shortness of breath.   Cardiovascular: Negative for chest pain, palpitations and leg swelling.  Gastrointestinal: Negative for nausea, abdominal pain and diarrhea.  Genitourinary: Negative for dysuria.  Musculoskeletal: Positive for back pain and joint pain. Negative for falls.  Skin: Negative for rash.  Neurological: Negative for loss of consciousness and headaches.  Endo/Heme/Allergies: Negative for polydipsia.  Psychiatric/Behavioral: Positive for depression. Negative for suicidal ideas. The patient is nervous/anxious. The patient does not have insomnia.     Objective  BP 120/82 mmHg  Pulse 101  Temp(Src) 98.4 F (36.9 C) (Oral)  Ht 5\' 7"  (1.702 m)  Wt 230 lb 8 oz (104.554 kg)  BMI 36.09 kg/m2  SpO2 95%  LMP 11/14/1978  Physical Exam  Physical Exam  Constitutional: She is oriented to person, place, and time and well-developed, well-nourished, and in no distress. No distress.  HENT:  Head: Normocephalic and atraumatic.  Eyes: Conjunctivae are normal.  Neck: Neck supple. No thyromegaly present.  Cardiovascular: Normal rate, regular rhythm and normal heart sounds.   No murmur heard. Pulmonary/Chest: Effort normal and breath sounds normal. She has no wheezes.  Abdominal: She exhibits no distension and no mass.  Musculoskeletal: She exhibits no edema.  Lymphadenopathy:    She has no cervical adenopathy.  Neurological: She is alert and oriented  to person, place, and time.  Skin: Skin is warm and dry. No rash noted. She is not diaphoretic.  Psychiatric: Memory, affect and judgment normal.    Lab Results  Component Value Date   TSH 0.44 11/20/2014   Lab Results  Component Value Date   WBC 8.6 12/17/2014   HGB 14.7 12/17/2014   HCT 43.9 12/17/2014   MCV 84.1 12/17/2014   PLT 316 12/17/2014   Lab Results  Component Value Date   CREATININE 1.0 11/20/2014   BUN 14 11/20/2014   NA 145 11/20/2014   K 5.0 11/20/2014   CL 106 11/20/2014  CO2 26 11/20/2014   Lab Results  Component Value Date   ALT 15 11/20/2014   AST 23 11/20/2014   ALKPHOS 86 11/20/2014   BILITOT 0.5 11/20/2014   Lab Results  Component Value Date   CHOL 297* 11/20/2014   Lab Results  Component Value Date   HDL 63.50 11/20/2014   Lab Results  Component Value Date   LDLCALC 202* 11/20/2014   Lab Results  Component Value Date   TRIG 157.0* 11/20/2014   Lab Results  Component Value Date   CHOLHDL 5 11/20/2014     Assessment & Plan  Hiatal hernia with gastroesophageal reflux Avoid offending foods, start probiotics. Do not eat large meals in late evening and consider raising head of bed. Encouraged to continue current meds.   Urinary incontinence Notes current urine cloudy but urinalysis is clear. Encouraged adequate hydration and probiotics, avoid harsh soaps   Chronic pain syndrome Continues to struggle with chronic low back and knee pain, just suffered a fall onto her right knee but the pain is improving. No change in meds encouraged to stay as active as tolerated.   Depression with anxiety Patient very anxious today about health concerns. Offered reassurance no concerns on exam today, no change in meds.   Obesity Encouraged DASH diet, decrease po intake and increase exercise as tolerated. Needs 7-8 hours of sleep nightly. Avoid trans fats, eat small, frequent meals every 4-5 hours with lean proteins, complex carbs and healthy  fats. Minimize simple carbs

## 2015-02-01 NOTE — Assessment & Plan Note (Signed)
Notes current urine cloudy but urinalysis is clear. Encouraged adequate hydration and probiotics, avoid harsh soaps

## 2015-02-01 NOTE — Assessment & Plan Note (Signed)
Avoid offending foods, start probiotics. Do not eat large meals in late evening and consider raising head of bed. Encouraged to continue current meds.

## 2015-02-01 NOTE — Assessment & Plan Note (Signed)
Encouraged DASH diet, decrease po intake and increase exercise as tolerated. Needs 7-8 hours of sleep nightly. Avoid trans fats, eat small, frequent meals every 4-5 hours with lean proteins, complex carbs and healthy fats. Minimize simple carbs 

## 2015-02-11 ENCOUNTER — Other Ambulatory Visit: Payer: Self-pay | Admitting: Family Medicine

## 2015-02-11 NOTE — Telephone Encounter (Signed)
I have agreed to prescribe this med. Can have prescription for Remeron 15 mg tabs, 1-2 tabs po qhs prn insomnia, disp #60 with 1 rf

## 2015-02-11 NOTE — Telephone Encounter (Signed)
Pt seen 01/27/15 and has follow up with PCP 03/2015. I do not see that we have ever sent Rx for mirtazapine.  Please advise.  Medication name:  Name from pharmacy:  mirtazapine (REMERON) 15 MG tablet MIRTAZAPINE 15 MG TABLET     Sig: TAKE 1/2 OR 1 TABLET BY MOUTH AT NIGHT    Dispense: 90 tablet   Refills: 1   Start: 02/11/2015   Class: Normal    Notes to pharmacy: DR Charlett Blake CHANGED DOSAGE TO 2 DAILY    Requested on: 08/21/2014    Originally ordered on: 04/07/2011 11/23/2014

## 2015-02-12 ENCOUNTER — Encounter: Payer: Self-pay | Admitting: Medical

## 2015-02-12 ENCOUNTER — Ambulatory Visit (INDEPENDENT_AMBULATORY_CARE_PROVIDER_SITE_OTHER): Payer: Medicare Other | Admitting: Medical

## 2015-02-12 VITALS — BP 132/73 | HR 90 | Temp 98.4°F | Ht 67.0 in | Wt 236.2 lb

## 2015-02-12 DIAGNOSIS — M25562 Pain in left knee: Secondary | ICD-10-CM

## 2015-02-12 DIAGNOSIS — G4700 Insomnia, unspecified: Secondary | ICD-10-CM

## 2015-02-12 DIAGNOSIS — G44209 Tension-type headache, unspecified, not intractable: Secondary | ICD-10-CM

## 2015-02-12 DIAGNOSIS — R519 Headache, unspecified: Secondary | ICD-10-CM | POA: Insufficient documentation

## 2015-02-12 DIAGNOSIS — R51 Headache: Secondary | ICD-10-CM

## 2015-02-12 DIAGNOSIS — M25561 Pain in right knee: Secondary | ICD-10-CM | POA: Insufficient documentation

## 2015-02-12 MED ORDER — AMITRIPTYLINE HCL 25 MG PO TABS: 25.0000 mg | ORAL_TABLET | Freq: Every evening | ORAL | Status: DC | PRN

## 2015-02-12 MED ORDER — DICLOFENAC SODIUM 75 MG PO TBEC: 75.0000 mg | DELAYED_RELEASE_TABLET | Freq: Two times a day (BID) | ORAL | Status: DC

## 2015-02-12 NOTE — Assessment & Plan Note (Signed)
Stop remeron. She states out of and does not work. She wants to try elavil again. Will write limited number. She needs to discuss future tx with Dr. Charlett Blake. Explained why.

## 2015-02-12 NOTE — Telephone Encounter (Signed)
Sent in prescription as instructed by PCP

## 2015-02-12 NOTE — Progress Notes (Signed)
Subjective:    Patient ID: Kayla [name], female    DOB: 1946-03-05, 69 y.o.   MRN: [id number]  HPI  Pt states she fell on her knees on feb 29th, She was running her house and slipped on carpet landing on both knees.. Lt knee is sore mild on palpation. The rt knee is still hurting moderate to severe at times. Hurts more at night.  Pt had some recent sore throat but then pain went away today. Was hurting for 2 days mild . Pt does have some allergies. She states the allergy symptoms are controlled. No fever, no chills, no diffuse soreness with recent faint st.(Nothing above her chronic fibromyalgia type pain)  Pt mentions. Some insomnia. Pt used to use elavil. Remeron is not helping. Pt states told by Dr. Charlett Blake states she would give elavil if remeron did not work. She doubled up on remeron but still could not sleep. She states ran out of remeron 2 nights ago.  Pt has some mild rt temporal ha. She states she has had this for years. Low level. She seems to always have this. Mentions both traps tense and tender. No other associated type neurologic signs or symptoms.    Review of Systems  Constitutional: Negative for fever, chills and fatigue.  HENT: Negative for nosebleeds, postnasal drip, rhinorrhea, sinus pressure and sore throat.        St resolved this am.  Allergies hx of.  Respiratory: Negative for cough, shortness of breath and wheezing.   Cardiovascular: Negative for chest pain and palpitations.  Musculoskeletal:       Knee pain.  Neurological: Positive for headaches. Negative for dizziness, seizures, syncope, speech difficulty, weakness, light-headedness and numbness.  Hematological: Negative for adenopathy. Does not bruise/bleed easily.  Psychiatric/Behavioral: Positive for sleep disturbance. Negative for suicidal ideas, behavioral problems, dysphoric mood and decreased concentration. The patient is not hyperactive.    Past Medical History  Diagnosis Date  . Allergy    allergic rhinitis  . Anemia     NOS  . Depression   . GERD (gastroesophageal reflux disease)   . Hyperlipidemia   . Thyroid disease     hypothyroidism  . Arthritis     osteoarthritis  . Hx of colonic polyps   . History of diverticulitis of colon   . Headache(784.0)   . Urinary incontinence   . Migraine   . Fibromyalgia   . Miscarriage   . Depression with anxiety 01/19/2009    Qualifier: Diagnosis of  By: Redmond Pulling MD, Frann Rider    . Obesity, unspecified 10/26/2013  . Asthma   . Hyperglycemia 11/23/2014  . Otitis, externa, infective 11/23/2014    History   Social History  . Marital Status: Divorced    Spouse Name: N/A  . Number of Children: N/A  . Years of Education: N/A   Occupational History  . unemployed    Social History Main Topics  . Smoking status: Never Smoker   . Smokeless tobacco: Never Used  . Alcohol Use: Yes  . Drug Use: Not on file  . Sexual Activity: Not on file   Other Topics Concern  . Not on file   Social History Narrative    Past Surgical History  Procedure Laterality Date  . Cholecystectomy    . Abdominal hysterectomy    . Tonsillectomy      Family History  Problem Relation Age of Onset  . Cancer Other     female, brain, lung  . Heart disease Other   .  Stroke Other   . Diabetes Other   . Arthritis Other   . Hyperlipidemia Other   . Hypertension Other   . Alzheimer's disease Mother   . Alzheimer's disease Father     Allergies  Allergen Reactions  . Adhesive [Tape]     blisters  . Penicillins     REACTION: rash  . Promethazine Hcl Other (See Comments)    Jerky movements    Current Outpatient Prescriptions on File Prior to Visit  Medication Sig Dispense Refill  . carisoprodol (SOMA) 350 MG tablet Take 1 tablet (350 mg total) by mouth at bedtime as needed for muscle spasms. alternates with Flexeril 30 tablet 5  . clonazePAM (KLONOPIN) 1 MG tablet Take 1 tablet (1 mg total) by mouth 3 (three) times daily. 90 tablet 4  .  cyclobenzaprine (FLEXERIL) 10 MG tablet Take 1 tablet by mouth at bedtime as needed. Alternate with SOMA.    . DULoxetine (CYMBALTA) 30 MG capsule Take 1 capsule (30 mg total) by mouth daily. Take the 60 mg tab in am and 30 mg tab in pm 30 capsule 3  . fluticasone-salmeterol (ADVAIR HFA) 45-21 MCG/ACT inhaler Inhale 2 puffs into the lungs 2 (two) times daily. 1 Inhaler 12  . gabapentin (NEURONTIN) 300 MG capsule Take 900 mg by mouth 3 (three) times daily.     . hyoscyamine (LEVSIN SL) 0.125 MG SL tablet Place 1 tablet (0.125 mg total) under the tongue every 4 (four) hours as needed. 30 tablet 1  . levothyroxine (SYNTHROID, LEVOTHROID) 75 MCG tablet Take 1 tablet (75 mcg total) by mouth daily. 30 tablet 3  . mirtazapine (REMERON) 15 MG tablet TAKE 1/2 OR 1 TABLET BY MOUTH AT NIGHT 60 tablet 1  . neomycin-polymyxin-hydrocortisone (CORTISPORIN) otic solution Place 3 drops into both ears 3 (three) times daily. 10 mL 0  . ondansetron (ZOFRAN) 4 MG tablet Take 1 tablet (4 mg total) by mouth every 8 (eight) hours as needed for nausea. 30 tablet 0  . Tapentadol HCl (NUCYNTA ER) 200 MG TB12 Take 1 tablet by mouth every 12 (twelve) hours.    . topiramate (TOPAMAX) 25 MG tablet TAKE 1 TABLET (25 MG TOTAL) BY MOUTH 2 (TWO) TIMES DAILY. 60 tablet 2  . atorvastatin (LIPITOR) 10 MG tablet Take 1 tablet (10 mg total) by mouth daily. (Patient not taking: Reported on 01/27/2015) 30 tablet 3  . DULoxetine (CYMBALTA) 60 MG capsule Take 60 mg by mouth daily.    . ranitidine (ZANTAC) 300 MG tablet Take 1 tablet (300 mg total) by mouth at bedtime. (Patient not taking: Reported on 02/12/2015) 30 tablet 3   No current facility-administered medications on file prior to visit.    BP 132/73 mmHg  Pulse 90  Temp(Src) 98.4 F (36.9 C) (Oral)  Ht 5\' 7"  (1.702 m)  Wt 236 lb 3.2 oz (107.14 kg)  BMI 36.99 kg/m2  SpO2 99%  LMP 11/14/1978      Objective:   Physical Exam  General Mental Status- Alert. General Appearance-  Not in acute distress.   Skin General: Color- Normal Color. Moisture- Normal Moisture.  General  Mental Status - Alert. General Appearance - Well groomed. Not in acute distress.  Skin Rashes- No Rashes.  HEENT Head- Normal. Ear Auditory Canal - Left- Normal. Right - Normal.Tympanic Membrane- Left- Normal. Right- Normal. Eye Sclera/Conjunctiva- Left- Normal. Right- Normal. Nose & Sinuses Nasal Mucosa- Left-  Boggy and Congested. Right-  Boggy and  Congested.no l maxillary or frontal sinus  pressure. Mouth & Throat Lips: Upper Lip- Normal: no dryness, cracking, pallor, cyanosis, or vesicular eruption. Lower Lip-Normal: no dryness, cracking, pallor, cyanosis or vesicular eruption. Buccal Mucosa- Bilateral- No Aphthous ulcers. Oropharynx- No Discharge or Erythema. +pnd. Tonsils: Characteristics- Bilateral- No Erythema or Congestion. Size/Enlargement- Bilateral- No enlargement.(no uvula) Discharge- bilateral-None.   Neck Carotid Arteries- Normal color. Moisture- Normal Moisture. No carotid bruits. No JVD.  Chest and Lung Exam Auscultation: Breath Sounds:-Normal.  Cardiovascular Auscultation:Rythm- Regular. Murmurs & Other Heart Sounds:Auscultation of the heart reveals- No Murmurs.  Abdomen Inspection:-Inspeection Normal. Palpation/Percussion:Note:No mass. Palpation and Percussion of the abdomen reveal- Non Tender, Non Distended + BS, no rebound or guarding.    Neurologic Cranial Nerve exam:- CN III-XII intact(No nystagmus), symmetric smile. Strength:- 5/5 equal and symmetric strength both upper and lower extremities.  Skin- rt temporal area- no dilation of veins. Faint tender.  Knees- Lt side- faint crepitus on rom. No instablity. No obvious pain on palpation.              Rt side- faint crepitus, medial tibial plateau direct tender on palpation. Good rom. No locking.      Assessment & Plan:

## 2015-02-12 NOTE — Progress Notes (Signed)
Pre visit review using our clinic review tool, if applicable. No additional management support is needed unless otherwise documented below in the visit note. 

## 2015-02-12 NOTE — Patient Instructions (Signed)
Knee pain, bilateral Worse on rt side. Will get xrays of both knees. Toradol im in office today. Start diclofenac tomorrow.   Headache Tension vs temporal arteritis. I think tension. First time seeing ordering sed rate. She can get that done tomorrow since lab closed today.   Insomnia Stop remeron. She states out of and does not work. She wants to try elavil again. Will write limited number. She needs to discuss future tx with Dr. Charlett Blake. Explained why.    Follow up in 7 days or as needed.  Your st is better. I think related to allergies and post nasal drainage.

## 2015-02-12 NOTE — Assessment & Plan Note (Addendum)
Worse on rt side. Will get xrays of both knees. Toradol im in office today. Start diclofenac tomorrow. May refer to ortho if pain rt knee persisting.

## 2015-02-12 NOTE — Assessment & Plan Note (Addendum)
Tension vs temporal arteritis. I think tension. First time seeing ordering sed rate. She can get that done tomorrow since lab closed today.  If changes or worsens pending lab results then ED evaluation.

## 2015-02-13 DIAGNOSIS — M25562 Pain in left knee: Secondary | ICD-10-CM | POA: Diagnosis not present

## 2015-02-13 DIAGNOSIS — M25561 Pain in right knee: Secondary | ICD-10-CM | POA: Diagnosis not present

## 2015-02-13 MED ORDER — KETOROLAC TROMETHAMINE 60 MG/2ML IM SOLN
60.0000 mg | Freq: Once | INTRAMUSCULAR | Status: AC
  Administered 2015-02-13: 60 mg via INTRAMUSCULAR

## 2015-02-13 NOTE — Addendum Note (Signed)
Addended by: Bunnie Domino on: 02/13/2015 08:09 AM   Modules accepted: Orders

## 2015-02-16 ENCOUNTER — Telehealth: Payer: Self-pay | Admitting: Family Medicine

## 2015-02-16 NOTE — Telephone Encounter (Signed)
Caller name: starr Relation to pt: self Call back number: [phone] Pharmacy:  Reason for call:   Patient states that she saw Percell Miller last week and was advised to do a lab and xray. Patient states that she doesn't fell well enough to come in for test and will try to make it by sometime this week

## 2015-02-19 ENCOUNTER — Encounter: Payer: Self-pay | Admitting: Family Medicine

## 2015-02-19 ENCOUNTER — Ambulatory Visit (INDEPENDENT_AMBULATORY_CARE_PROVIDER_SITE_OTHER): Payer: Medicare Other | Admitting: Family Medicine

## 2015-02-19 DIAGNOSIS — L989 Disorder of the skin and subcutaneous tissue, unspecified: Secondary | ICD-10-CM

## 2015-02-19 DIAGNOSIS — J209 Acute bronchitis, unspecified: Secondary | ICD-10-CM | POA: Diagnosis not present

## 2015-02-19 DIAGNOSIS — J309 Allergic rhinitis, unspecified: Secondary | ICD-10-CM

## 2015-02-19 DIAGNOSIS — H109 Unspecified conjunctivitis: Secondary | ICD-10-CM | POA: Diagnosis not present

## 2015-02-19 DIAGNOSIS — H61899 Other specified disorders of external ear, unspecified ear: Secondary | ICD-10-CM

## 2015-02-19 DIAGNOSIS — K219 Gastro-esophageal reflux disease without esophagitis: Secondary | ICD-10-CM

## 2015-02-19 DIAGNOSIS — G4700 Insomnia, unspecified: Secondary | ICD-10-CM

## 2015-02-19 MED ORDER — AMITRIPTYLINE HCL 50 MG PO TABS: 50.0000 mg | ORAL_TABLET | Freq: Every day | ORAL | Status: DC

## 2015-02-19 MED ORDER — METHYLPREDNISOLONE (PAK) 4 MG PO TABS: ORAL_TABLET | ORAL | Status: DC

## 2015-02-19 MED ORDER — HYDROCODONE-HOMATROPINE 5-1.5 MG/5ML PO SYRP: 5.0000 mL | ORAL_SOLUTION | Freq: Three times a day (TID) | ORAL | Status: DC | PRN

## 2015-02-19 MED ORDER — CIPROFLOXACIN HCL 500 MG PO TABS: 500.0000 mg | ORAL_TABLET | Freq: Two times a day (BID) | ORAL | Status: DC

## 2015-02-19 NOTE — Progress Notes (Signed)
Kayla [name]  [id number] [birth year] 02/19/2015      Progress Note-Follow Up  Subjective  Chief Complaint  Chief Complaint  Patient presents with  . URI    coughing, hoarse  . Eye Problem    both eyes are red     HPI  Patient is a 69 y.o. female in today for routine medical care. Patient is in today for evaluation of respiratory symptoms. She has been feeling poorly for over a week. Is dealing with cough and congestion. Has malaise, myalgias, fatigue. Denies fevers, chills, wheezing and vomiting. Acknowledges some nausea. Has had some low-grade constipation but magnesium has been helping that. Has itchy watery eyes. Has ear pain. Denies CP/palp/SOB/HA or GU c/o. Taking meds as prescribed  Past Medical History  Diagnosis Date  . Allergy     allergic rhinitis  . Anemia     NOS  . Depression   . GERD (gastroesophageal reflux disease)   . Hyperlipidemia   . Thyroid disease     hypothyroidism  . Arthritis     osteoarthritis  . Hx of colonic polyps   . History of diverticulitis of colon   . Headache(784.0)   . Urinary incontinence   . Migraine   . Fibromyalgia   . Miscarriage   . Depression with anxiety 01/19/2009    Qualifier: Diagnosis of  By: Redmond Pulling MD, Frann Rider    . Obesity, unspecified 10/26/2013  . Asthma   . Hyperglycemia 11/23/2014  . Otitis, externa, infective 11/23/2014    Past Surgical History  Procedure Laterality Date  . Cholecystectomy    . Abdominal hysterectomy    . Tonsillectomy      Family History  Problem Relation Age of Onset  . Cancer Other     female, brain, lung  . Heart disease Other   . Stroke Other   . Diabetes Other   . Arthritis Other   . Hyperlipidemia Other   . Hypertension Other   . Alzheimer's disease Mother   . Alzheimer's disease Father     History   Social History  . Marital Status: Divorced    Spouse Name: N/A  . Number of Children: N/A  . Years of Education: N/A   Occupational History  . unemployed    Social  History Main Topics  . Smoking status: Never Smoker   . Smokeless tobacco: Never Used  . Alcohol Use: Yes  . Drug Use: Not on file  . Sexual Activity: Not on file   Other Topics Concern  . Not on file   Social History Narrative    Current Outpatient Prescriptions on File Prior to Visit  Medication Sig Dispense Refill  . amitriptyline (ELAVIL) 25 MG tablet Take 1 tablet (25 mg total) by mouth at bedtime as needed for sleep. 10 tablet 0  . carisoprodol (SOMA) 350 MG tablet Take 1 tablet (350 mg total) by mouth at bedtime as needed for muscle spasms. alternates with Flexeril 30 tablet 5  . clonazePAM (KLONOPIN) 1 MG tablet Take 1 tablet (1 mg total) by mouth 3 (three) times daily. 90 tablet 4  . cyclobenzaprine (FLEXERIL) 10 MG tablet Take 1 tablet by mouth at bedtime as needed. Alternate with SOMA.    . diclofenac (VOLTAREN) 75 MG EC tablet Take 1 tablet (75 mg total) by mouth 2 (two) times daily. 30 tablet 0  . DULoxetine (CYMBALTA) 60 MG capsule Take 60 mg by mouth daily.    . fluticasone-salmeterol (ADVAIR HFA) 45-21 MCG/ACT inhaler  Inhale 2 puffs into the lungs 2 (two) times daily. 1 Inhaler 12  . gabapentin (NEURONTIN) 300 MG capsule Take 900 mg by mouth 3 (three) times daily.     . hyoscyamine (LEVSIN SL) 0.125 MG SL tablet Place 1 tablet (0.125 mg total) under the tongue every 4 (four) hours as needed. 30 tablet 1  . levothyroxine (SYNTHROID, LEVOTHROID) 75 MCG tablet Take 1 tablet (75 mcg total) by mouth daily. 30 tablet 3  . mirtazapine (REMERON) 15 MG tablet TAKE 1/2 OR 1 TABLET BY MOUTH AT NIGHT 60 tablet 1  . neomycin-polymyxin-hydrocortisone (CORTISPORIN) otic solution Place 3 drops into both ears 3 (three) times daily. 10 mL 0  . ondansetron (ZOFRAN) 4 MG tablet Take 1 tablet (4 mg total) by mouth every 8 (eight) hours as needed for nausea. 30 tablet 0  . topiramate (TOPAMAX) 25 MG tablet TAKE 1 TABLET (25 MG TOTAL) BY MOUTH 2 (TWO) TIMES DAILY. 60 tablet 2  . atorvastatin  (LIPITOR) 10 MG tablet Take 1 tablet (10 mg total) by mouth daily. (Patient not taking: Reported on 02/19/2015) 30 tablet 3  . ranitidine (ZANTAC) 300 MG tablet Take 1 tablet (300 mg total) by mouth at bedtime. (Patient not taking: Reported on 02/19/2015) 30 tablet 3   No current facility-administered medications on file prior to visit.    Allergies  Allergen Reactions  . Adhesive [Tape]     blisters  . Penicillins     REACTION: rash  . Promethazine Hcl Other (See Comments)    Jerky movements    Review of Systems  Review of Systems  Constitutional: Negative for fever and malaise/fatigue.  HENT: Positive for congestion and sore throat.   Eyes: Negative for discharge.  Respiratory: Positive for cough and sputum production. Negative for shortness of breath.   Cardiovascular: Negative for chest pain, palpitations and leg swelling.  Gastrointestinal: Positive for constipation. Negative for nausea, abdominal pain and diarrhea.  Genitourinary: Negative for dysuria.  Musculoskeletal: Negative for falls.  Skin: Negative for rash.  Neurological: Negative for loss of consciousness and headaches.  Endo/Heme/Allergies: Negative for polydipsia.  Psychiatric/Behavioral: Negative for depression and suicidal ideas. The patient is not nervous/anxious and does not have insomnia.     Objective  LMP 11/14/1978  Physical Exam  Physical Exam  Constitutional: She is oriented to person, place, and time and well-developed, well-nourished, and in no distress. No distress.  HENT:  Head: Normocephalic and atraumatic.  B/l ear canals erythematous. Left canal scant blood and a papule, flesh colored  Eyes: Right eye exhibits discharge. Left eye exhibits discharge.  Conjunctivae very injected b/l R>L  Neck: Neck supple. No thyromegaly present.  Cardiovascular: Normal rate, regular rhythm and normal heart sounds.   No murmur heard. Pulmonary/Chest: Effort normal and breath sounds normal. She has no  wheezes.  Abdominal: Soft. Bowel sounds are normal. She exhibits no distension and no mass.  Musculoskeletal: She exhibits no edema.  Lymphadenopathy:    She has no cervical adenopathy.  Neurological: She is alert and oriented to person, place, and time.  Skin: Skin is warm and dry. No rash noted. She is not diaphoretic.  Psychiatric: Memory, affect and judgment normal.    Lab Results  Component Value Date   TSH 0.44 11/20/2014   Lab Results  Component Value Date   WBC 8.6 12/17/2014   HGB 14.7 12/17/2014   HCT 43.9 12/17/2014   MCV 84.1 12/17/2014   PLT 316 12/17/2014   Lab Results  Component Value  Date   CREATININE 1.0 11/20/2014   BUN 14 11/20/2014   NA 145 11/20/2014   K 5.0 11/20/2014   CL 106 11/20/2014   CO2 26 11/20/2014   Lab Results  Component Value Date   ALT 15 11/20/2014   AST 23 11/20/2014   ALKPHOS 86 11/20/2014   BILITOT 0.5 11/20/2014   Lab Results  Component Value Date   CHOL 297* 11/20/2014   Lab Results  Component Value Date   HDL 63.50 11/20/2014   Lab Results  Component Value Date   LDLCALC 202* 11/20/2014   Lab Results  Component Value Date   TRIG 157.0* 11/20/2014   Lab Results  Component Value Date   CHOLHDL 5 11/20/2014     Assessment & Plan  GERD Avoid offending foods, start probiotics. Do not eat large meals in late evening and consider raising head of bed.    Allergic rhinitis Was encouraged to try Antihistamines bid and Flonase daily   Acute bronchitis Started on Ciprofloxacin and Medrol. Start Mucinex and probiotics.   Conjunctivitis of left eye Referred to opthamology for surveillance she is in need of a new opthamologist   Lesion of ear canal Raised lesion in left ear canal referred to ENT for evaluation    No problem-specific assessment & plan notes found for this encounter.

## 2015-02-19 NOTE — Patient Instructions (Signed)

## 2015-02-19 NOTE — Progress Notes (Signed)
Pre visit review using our clinic review tool, if applicable. No additional management support is needed unless otherwise documented below in the visit note. 

## 2015-02-22 DIAGNOSIS — J209 Acute bronchitis, unspecified: Secondary | ICD-10-CM | POA: Insufficient documentation

## 2015-02-22 DIAGNOSIS — H61899 Other specified disorders of external ear, unspecified ear: Secondary | ICD-10-CM | POA: Insufficient documentation

## 2015-02-22 DIAGNOSIS — H109 Unspecified conjunctivitis: Secondary | ICD-10-CM | POA: Insufficient documentation

## 2015-02-22 NOTE — Assessment & Plan Note (Signed)
Was encouraged to try Antihistamines bid and Flonase daily

## 2015-02-22 NOTE — Assessment & Plan Note (Signed)
Referred to opthamology for surveillance she is in need of a new opthamologist

## 2015-02-22 NOTE — Assessment & Plan Note (Signed)
Started on Ciprofloxacin and Medrol. Start Mucinex and probiotics.

## 2015-02-22 NOTE — Assessment & Plan Note (Signed)
Raised lesion in left ear canal referred to ENT for evaluation

## 2015-02-22 NOTE — Assessment & Plan Note (Signed)
Avoid offending foods, start probiotics. Do not eat large meals in late evening and consider raising head of bed.  

## 2015-03-13 ENCOUNTER — Other Ambulatory Visit: Payer: Medicare Other

## 2015-03-20 ENCOUNTER — Other Ambulatory Visit (INDEPENDENT_AMBULATORY_CARE_PROVIDER_SITE_OTHER): Payer: Medicare Other

## 2015-03-20 ENCOUNTER — Ambulatory Visit (HOSPITAL_BASED_OUTPATIENT_CLINIC_OR_DEPARTMENT_OTHER)
Admission: RE | Admit: 2015-03-20 | Discharge: 2015-03-20 | Disposition: A | Discharge status: Home / Self Care | Payer: Medicare Other | Source: Ambulatory Visit | Attending: Medical | Admitting: Medical

## 2015-03-20 DIAGNOSIS — M25562 Pain in left knee: Principal | ICD-10-CM

## 2015-03-20 DIAGNOSIS — K219 Gastro-esophageal reflux disease without esophagitis: Secondary | ICD-10-CM

## 2015-03-20 DIAGNOSIS — E782 Mixed hyperlipidemia: Secondary | ICD-10-CM | POA: Diagnosis not present

## 2015-03-20 DIAGNOSIS — G44209 Tension-type headache, unspecified, not intractable: Secondary | ICD-10-CM

## 2015-03-20 DIAGNOSIS — E039 Hypothyroidism, unspecified: Secondary | ICD-10-CM

## 2015-03-20 DIAGNOSIS — M25561 Pain in right knee: Secondary | ICD-10-CM

## 2015-03-20 LAB — COMPREHENSIVE METABOLIC PANEL
ALT: 17 U/L (ref 0–35)
AST: 15 U/L (ref 0–37)
Albumin: 3.9 g/dL (ref 3.5–5.2)
Alkaline Phosphatase: 85 U/L (ref 39–117)
BUN: 16 mg/dL (ref 6–23)
CALCIUM: 9.9 mg/dL (ref 8.4–10.5)
CHLORIDE: 103 meq/L (ref 96–112)
CO2: 31 meq/L (ref 19–32)
CREATININE: 0.88 mg/dL (ref 0.40–1.20)
GFR: 67.76 mL/min (ref >60.00)
Glucose, Bld: 91 mg/dL (ref 70–99)
Potassium: 4.3 mEq/L (ref 3.5–5.1)
Sodium: 139 mEq/L (ref 135–145)
Total Bilirubin: 0.6 mg/dL (ref 0.2–1.2)
Total Protein: 7.7 g/dL (ref 6.0–8.3)

## 2015-03-20 LAB — CBC
HEMATOCRIT: 43.8 % (ref 36.0–46.0)
Hemoglobin: 14.7 g/dL (ref 12.0–15.0)
MCHC: 33.5 g/dL (ref 30.0–36.0)
MCV: 82.3 fl (ref 78.0–100.0)
Platelets: 302 10*3/uL (ref 150.0–400.0)
RBC: 5.33 Mil/uL — AB (ref 3.87–5.11)
RDW: 13.8 % (ref 11.5–15.5)
WBC: 9 10*3/uL (ref 4.0–10.5)

## 2015-03-20 LAB — LIPID PANEL
CHOLESTEROL: 278 mg/dL — AB (ref 0–200)
HDL: 69.6 mg/dL (ref >39.00)
LDL Cholesterol: 186 mg/dL — ABNORMAL HIGH (ref 0–99)
NonHDL: 208.4
TRIGLYCERIDES: 113 mg/dL (ref 0.0–149.0)
Total CHOL/HDL Ratio: 4
VLDL: 22.6 mg/dL (ref 0.0–40.0)

## 2015-03-20 LAB — SEDIMENTATION RATE: Sed Rate: 32 mm/hr — ABNORMAL HIGH (ref 0–22)

## 2015-03-20 LAB — TSH: TSH: 0.19 u[IU]/mL — ABNORMAL LOW (ref 0.35–4.50)

## 2015-03-22 ENCOUNTER — Telehealth: Payer: Self-pay | Admitting: Medical

## 2015-03-22 NOTE — Telephone Encounter (Signed)
Note to Dr. Randel Pigg on pt elevated sed rate.

## 2015-03-22 NOTE — Telephone Encounter (Signed)
I think the elevated Sed Rate was more related to an acute infection, generally sed rates are higher with temporal arteritis. Will have my CMA reach out to patient and have her repeat a Sed Rate to assess improvement. Please order Sed Rate for abnormal sedimentation rate

## 2015-03-23 ENCOUNTER — Telehealth: Payer: Self-pay | Admitting: Medical

## 2015-03-23 ENCOUNTER — Other Ambulatory Visit: Payer: Self-pay | Admitting: Family Medicine

## 2015-03-23 DIAGNOSIS — G44209 Tension-type headache, unspecified, not intractable: Secondary | ICD-10-CM

## 2015-03-23 NOTE — Telephone Encounter (Signed)
Sed rate entered.

## 2015-03-23 NOTE — Telephone Encounter (Signed)
xrays ordered. Pt never got study.

## 2015-03-23 NOTE — Telephone Encounter (Signed)
Put order in for repeat sed rate.  Patient will come in on Thursday to do blood work.  She did not want to schedule lab appt. As states forgets appts. And will just walk in on Wednesday and be put on schedule.  Patient will see Dr. Charlett Blake for appt. On Thursday may 12th at 4 to followup.

## 2015-03-26 ENCOUNTER — Ambulatory Visit: Payer: Medicare Other | Admitting: Family Medicine

## 2015-03-30 ENCOUNTER — Other Ambulatory Visit (INDEPENDENT_AMBULATORY_CARE_PROVIDER_SITE_OTHER): Payer: Medicare Other

## 2015-03-30 DIAGNOSIS — G44209 Tension-type headache, unspecified, not intractable: Secondary | ICD-10-CM

## 2015-03-31 LAB — SEDIMENTATION RATE: SED RATE: 9 mm/h (ref 0–22)

## 2015-04-01 ENCOUNTER — Telehealth: Payer: Self-pay

## 2015-04-01 NOTE — Telephone Encounter (Signed)
Pt notified of results/ no questions or concerns at this time.

## 2015-04-01 NOTE — Telephone Encounter (Signed)
-----   Message from Mosie Lukes, MD sent at 03/31/2015 10:48 PM EDT ----- Notify sed rate now normal

## 2015-04-02 ENCOUNTER — Ambulatory Visit (INDEPENDENT_AMBULATORY_CARE_PROVIDER_SITE_OTHER): Payer: Medicare Other | Admitting: Family Medicine

## 2015-04-02 ENCOUNTER — Encounter: Payer: Self-pay | Admitting: Family Medicine

## 2015-04-02 VITALS — BP 120/78 | HR 91 | Temp 98.5°F | Ht 67.0 in | Wt 239.2 lb

## 2015-04-02 DIAGNOSIS — B379 Candidiasis, unspecified: Secondary | ICD-10-CM

## 2015-04-02 DIAGNOSIS — K219 Gastro-esophageal reflux disease without esophagitis: Secondary | ICD-10-CM

## 2015-04-02 DIAGNOSIS — F418 Other specified anxiety disorders: Secondary | ICD-10-CM

## 2015-04-02 DIAGNOSIS — E669 Obesity, unspecified: Secondary | ICD-10-CM

## 2015-04-02 DIAGNOSIS — J209 Acute bronchitis, unspecified: Secondary | ICD-10-CM | POA: Diagnosis not present

## 2015-04-02 DIAGNOSIS — E039 Hypothyroidism, unspecified: Secondary | ICD-10-CM

## 2015-04-02 MED ORDER — AMITRIPTYLINE HCL 100 MG PO TABS
100.0000 mg | ORAL_TABLET | Freq: Every day | ORAL | Status: DC
Start: 2015-04-02 — End: 2015-12-14

## 2015-04-02 MED ORDER — DULOXETINE HCL 60 MG PO CPEP: 60.0000 mg | ORAL_CAPSULE | Freq: Every day | ORAL | Status: DC

## 2015-04-02 MED ORDER — FLUCONAZOLE 150 MG PO TABS: 300.0000 mg | ORAL_TABLET | ORAL | Status: DC

## 2015-04-02 NOTE — Progress Notes (Signed)
Pre visit review using our clinic review tool, if applicable. No additional management support is needed unless otherwise documented below in the visit note. 

## 2015-04-02 NOTE — Patient Instructions (Signed)
Salon pas gel prn shoulder pain    Cholesterol Cholesterol is a white, waxy, fat-like substance needed by your body in small amounts. The liver makes all the cholesterol you need. Cholesterol is carried from the liver by the blood through the blood vessels. Deposits of cholesterol (plaque) may build up on blood vessel walls. These make the arteries narrower and stiffer. Cholesterol plaques increase the risk for heart attack and stroke.  You cannot feel your cholesterol level even if it is very high. The only way to know it is high is with a blood test. Once you know your cholesterol levels, you should keep a record of the test results. Work with your health care provider to keep your levels in the desired range.  WHAT DO THE RESULTS MEAN?  Total cholesterol is a rough measure of all the cholesterol in your blood.   LDL is the so-called bad cholesterol. This is the type that deposits cholesterol in the walls of the arteries. You want this level to be low.   HDL is the good cholesterol because it cleans the arteries and carries the LDL away. You want this level to be high.  Triglycerides are fat that the body can either burn for energy or store. High levels are closely linked to heart disease.  WHAT ARE THE DESIRED LEVELS OF CHOLESTEROL?  Total cholesterol below 200.   LDL below 100 for people at risk, below 70 for those at very high risk.   HDL above 50 is good, above 60 is best.   Triglycerides below 150.  HOW CAN I LOWER MY CHOLESTEROL?  Diet. Follow your diet programs as directed by your health care provider.   Choose fish or white meat chicken and [place], roasted or baked. Limit fatty cuts of red meat, fried foods, and processed meats, such as sausage and lunch meats.   Eat lots of fresh fruits and vegetables.  Choose whole grains, beans, pasta, potatoes, and cereals.   Use only small amounts of olive, corn, or canola oils.   Avoid butter, mayonnaise, shortening, or  palm kernel oils.  Avoid foods with trans fats.   Drink skim or nonfat milk and eat low-fat or nonfat yogurt and cheeses. Avoid whole milk, cream, ice cream, egg yolks, and full-fat cheeses.   Healthy desserts include angel food cake, ginger snaps, animal crackers, hard candy, popsicles, and low-fat or nonfat frozen yogurt. Avoid pastries, cakes, pies, and cookies.   Exercise. Follow your exercise programs as directed by your health care provider.   A regular program helps decrease LDL and raise HDL.   A regular program helps with weight control.   Do things that increase your activity level like gardening, walking, or taking the stairs. Ask your health care provider about how you can be more active in your daily life.   Medicine. Take medicine only as directed by your health care provider.   Medicine may be prescribed by your health care provider to help lower cholesterol and decrease the risk for heart disease.   If you have several risk factors, you may need medicine even if your levels are normal. Document Released: 07/26/2001 Document Revised: 03/17/2014 Document Reviewed: 08/14/2013 [facility] Patient Information 2015 [place], [place]. This information is not intended to replace advice given to you by your health care provider. Make sure you discuss any questions you have with your health care provider.

## 2015-04-06 DIAGNOSIS — M791 Myalgia: Secondary | ICD-10-CM | POA: Diagnosis not present

## 2015-04-06 DIAGNOSIS — M4696 Unspecified inflammatory spondylopathy, lumbar region: Secondary | ICD-10-CM | POA: Diagnosis not present

## 2015-04-06 DIAGNOSIS — M1710 Unilateral primary osteoarthritis, unspecified knee: Secondary | ICD-10-CM | POA: Diagnosis not present

## 2015-04-06 DIAGNOSIS — M545 Low back pain: Secondary | ICD-10-CM | POA: Diagnosis not present

## 2015-04-13 NOTE — Assessment & Plan Note (Signed)
improved

## 2015-04-13 NOTE — Assessment & Plan Note (Signed)
Is agitated today because she ran out of her Cymbalta, she will be restarted.

## 2015-04-13 NOTE — Progress Notes (Signed)
Kayla [name]  [id number] March 05, 1946 04/13/2015      Progress Note-Follow Up  Subjective  Chief Complaint  Chief Complaint  Patient presents with  . Follow-up    HPI  Patient is a 69 y.o. female in today for routine medical care. Patient is in today for follow-up. Is a little tearful secondary to being out of her Cymbalta. Does feel the Cymbalta helps her stay, and manages some for pain. She has been struggling with increased low back pain but no increase in radiculopathy or no injury. No recent illness fevers or chills. Denies CP/palp/SOB/HA/congestion/fevers/GI or GU c/o. Taking meds as prescribed  Past Medical History  Diagnosis Date  . Allergy     allergic rhinitis  . Anemia     NOS  . Depression   . GERD (gastroesophageal reflux disease)   . Hyperlipidemia   . Thyroid disease     hypothyroidism  . Arthritis     osteoarthritis  . Hx of colonic polyps   . History of diverticulitis of colon   . Headache(784.0)   . Urinary incontinence   . Migraine   . Fibromyalgia   . Miscarriage   . Depression with anxiety 01/19/2009    Qualifier: Diagnosis of  By: Redmond Pulling MD, Frann Rider    . Obesity, unspecified 10/26/2013  . Asthma   . Hyperglycemia 11/23/2014  . Otitis, externa, infective 11/23/2014    Past Surgical History  Procedure Laterality Date  . Cholecystectomy    . Abdominal hysterectomy    . Tonsillectomy      Family History  Problem Relation Age of Onset  . Cancer Other     female, brain, lung  . Heart disease Other   . Stroke Other   . Diabetes Other   . Arthritis Other   . Hyperlipidemia Other   . Hypertension Other   . Alzheimer's disease Mother   . Alzheimer's disease Father     History   Social History  . Marital Status: Divorced    Spouse Name: N/A  . Number of Children: N/A  . Years of Education: N/A   Occupational History  . unemployed    Social History Main Topics  . Smoking status: Never Smoker   . Smokeless tobacco: Never Used  .  Alcohol Use: Yes  . Drug Use: Not on file  . Sexual Activity: Not on file   Other Topics Concern  . Not on file   Social History Narrative    Current Outpatient Prescriptions on File Prior to Visit  Medication Sig Dispense Refill  . carisoprodol (SOMA) 350 MG tablet Take 1 tablet (350 mg total) by mouth at bedtime as needed for muscle spasms. alternates with Flexeril 30 tablet 5  . clonazePAM (KLONOPIN) 1 MG tablet Take 1 tablet (1 mg total) by mouth 3 (three) times daily. 90 tablet 4  . cyclobenzaprine (FLEXERIL) 10 MG tablet Take 1 tablet by mouth at bedtime as needed. Alternate with SOMA.    . fluticasone-salmeterol (ADVAIR HFA) 45-21 MCG/ACT inhaler Inhale 2 puffs into the lungs 2 (two) times daily. 1 Inhaler 12  . gabapentin (NEURONTIN) 300 MG capsule Take 900 mg by mouth 3 (three) times daily.     Marland Kitchen levothyroxine (SYNTHROID, LEVOTHROID) 75 MCG tablet Take 1 tablet (75 mcg total) by mouth daily. 30 tablet 3  . ondansetron (ZOFRAN) 4 MG tablet Take 1 tablet (4 mg total) by mouth every 8 (eight) hours as needed for nausea. 30 tablet 0  . topiramate (TOPAMAX)  25 MG tablet TAKE 1 TABLET (25 MG TOTAL) BY MOUTH 2 (TWO) TIMES DAILY. 60 tablet 2  . atorvastatin (LIPITOR) 10 MG tablet Take 1 tablet (10 mg total) by mouth daily. (Patient not taking: Reported on 02/19/2015) 30 tablet 3  . diclofenac (VOLTAREN) 75 MG EC tablet Take 1 tablet (75 mg total) by mouth 2 (two) times daily. (Patient not taking: Reported on 04/02/2015) 30 tablet 0  . HYDROcodone-homatropine (HYCODAN) 5-1.5 MG/5ML syrup Take 5 mLs by mouth every 8 (eight) hours as needed for cough. (Patient not taking: Reported on 04/02/2015) 180 mL 0  . hyoscyamine (LEVSIN SL) 0.125 MG SL tablet Place 1 tablet (0.125 mg total) under the tongue every 4 (four) hours as needed. (Patient not taking: Reported on 04/02/2015) 30 tablet 1  . NUCYNTA ER 100 MG TB12 Take 1 tablet by mouth every 12 (twelve) hours.  0  . ranitidine (ZANTAC) 300 MG tablet  Take 1 tablet (300 mg total) by mouth at bedtime. (Patient not taking: Reported on 04/02/2015) 30 tablet 3   No current facility-administered medications on file prior to visit.    Allergies  Allergen Reactions  . Adhesive [Tape]     blisters  . Penicillins     REACTION: rash  . Promethazine Hcl Other (See Comments)    Jerky movements    Review of Systems  Review of Systems  Constitutional: Negative for fever and malaise/fatigue.  HENT: Negative for congestion.   Eyes: Negative for discharge.  Respiratory: Negative for shortness of breath.   Cardiovascular: Negative for chest pain, palpitations and leg swelling.  Gastrointestinal: Negative for nausea, abdominal pain and diarrhea.  Genitourinary: Negative for dysuria.  Musculoskeletal: Positive for back pain. Negative for falls.  Skin: Negative for rash.  Neurological: Negative for loss of consciousness and headaches.  Endo/Heme/Allergies: Negative for polydipsia.  Psychiatric/Behavioral: Positive for depression. Negative for suicidal ideas. The patient is nervous/anxious. The patient does not have insomnia.     Objective  BP 120/78 mmHg  Pulse 91  Temp(Src) 98.5 F (36.9 C) (Oral)  Ht 5\' 7"  (1.702 m)  Wt 239 lb 4 oz (108.523 kg)  BMI 37.46 kg/m2  SpO2 94%  LMP 11/14/1978  Physical Exam  Physical Exam  Constitutional: She is oriented to person, place, and time and well-developed, well-nourished, and in no distress. No distress.  HENT:  Head: Normocephalic and atraumatic.  Eyes: Conjunctivae are normal.  Neck: Neck supple. No thyromegaly present.  Cardiovascular: Normal rate, regular rhythm and normal heart sounds.   No murmur heard. Pulmonary/Chest: Effort normal and breath sounds normal. She has no wheezes.  Abdominal: She exhibits no distension and no mass.  Musculoskeletal: She exhibits no edema.  Lymphadenopathy:    She has no cervical adenopathy.  Neurological: She is alert and oriented to person, place,  and time.  Skin: Skin is warm and dry. No rash noted. She is not diaphoretic.  Psychiatric: Memory, affect and judgment normal.    Lab Results  Component Value Date   TSH 0.19* 03/20/2015   Lab Results  Component Value Date   WBC 9.0 03/20/2015   HGB 14.7 03/20/2015   HCT 43.8 03/20/2015   MCV 82.3 03/20/2015   PLT 302.0 03/20/2015   Lab Results  Component Value Date   CREATININE 0.88 03/20/2015   BUN 16 03/20/2015   NA 139 03/20/2015   K 4.3 03/20/2015   CL 103 03/20/2015   CO2 31 03/20/2015   Lab Results  Component Value Date  ALT 17 03/20/2015   AST 15 03/20/2015   ALKPHOS 85 03/20/2015   BILITOT 0.6 03/20/2015   Lab Results  Component Value Date   CHOL 278* 03/20/2015   Lab Results  Component Value Date   HDL 69.60 03/20/2015   Lab Results  Component Value Date   LDLCALC 186* 03/20/2015   Lab Results  Component Value Date   TRIG 113.0 03/20/2015   Lab Results  Component Value Date   CHOLHDL 4 03/20/2015     Assessment & Plan  No problem-specific assessment & plan notes found for this encounter.

## 2015-04-13 NOTE — Assessment & Plan Note (Signed)
Encouraged DASH diet, decrease po intake and increase exercise as tolerated. Needs 7-8 hours of sleep nightly. Avoid trans fats, eat small, frequent meals every 4-5 hours with lean proteins, complex carbs and healthy fats. Minimize simple carbs, GMO foods. 

## 2015-04-13 NOTE — Assessment & Plan Note (Signed)
Avoid offending foods, start probiotics. Do not eat large meals in late evening and consider raising head of bed.  

## 2015-04-13 NOTE — Assessment & Plan Note (Signed)
On Levothyroxine, continue to monitor 

## 2015-04-20 ENCOUNTER — Other Ambulatory Visit: Payer: Self-pay | Admitting: Family Medicine

## 2015-05-07 ENCOUNTER — Ambulatory Visit: Payer: Medicare Other | Admitting: Family Medicine

## 2015-05-25 ENCOUNTER — Other Ambulatory Visit: Payer: Self-pay | Admitting: Family Medicine

## 2015-05-25 MED ORDER — CLONAZEPAM 1 MG PO TABS: 1.0000 mg | ORAL_TABLET | Freq: Three times a day (TID) | ORAL | Status: DC

## 2015-05-25 NOTE — Telephone Encounter (Signed)
Requesting: clonazepam Contract  Signed 09/09/14 UDS  none Last OV  04/02/15 Last Refill  #90 with 4 refills on 11/20/14  Please Advise

## 2015-05-25 NOTE — Telephone Encounter (Signed)
Requesting:CLONAZEPAM Contract UDS  Last OV Last Refill  Please Advise

## 2015-05-25 NOTE — Telephone Encounter (Signed)
Caller name: neima Relation to pt: self Call back number: [phone] [facility] [address]  Reason for call:   Patient requesting klonopin refill. She is out.

## 2015-05-26 NOTE — Telephone Encounter (Signed)
Faxed hardcopy to [place]

## 2015-05-26 NOTE — Telephone Encounter (Signed)
Faxed hardcopy for Clonazepam to [facility] [place].

## 2015-06-02 ENCOUNTER — Encounter: Payer: Self-pay | Admitting: Family Medicine

## 2015-06-02 ENCOUNTER — Ambulatory Visit (INDEPENDENT_AMBULATORY_CARE_PROVIDER_SITE_OTHER): Payer: Medicare Other | Admitting: Family Medicine

## 2015-06-02 VITALS — BP 112/72 | HR 80 | Temp 98.2°F | Ht 67.0 in | Wt 235.0 lb

## 2015-06-02 DIAGNOSIS — Z1239 Encounter for other screening for malignant neoplasm of breast: Secondary | ICD-10-CM | POA: Diagnosis not present

## 2015-06-02 DIAGNOSIS — R739 Hyperglycemia, unspecified: Secondary | ICD-10-CM

## 2015-06-02 DIAGNOSIS — E039 Hypothyroidism, unspecified: Secondary | ICD-10-CM | POA: Diagnosis not present

## 2015-06-02 DIAGNOSIS — E669 Obesity, unspecified: Secondary | ICD-10-CM

## 2015-06-02 DIAGNOSIS — K219 Gastro-esophageal reflux disease without esophagitis: Secondary | ICD-10-CM

## 2015-06-02 DIAGNOSIS — M7989 Other specified soft tissue disorders: Secondary | ICD-10-CM

## 2015-06-02 DIAGNOSIS — G894 Chronic pain syndrome: Secondary | ICD-10-CM

## 2015-06-02 NOTE — Progress Notes (Signed)
Pre visit review using our clinic review tool, if applicable. No additional management support is needed unless otherwise documented below in the visit note. 

## 2015-06-02 NOTE — Patient Instructions (Signed)
Hypothyroidism The thyroid is a large gland located in the lower front of your neck. The thyroid gland helps control metabolism. Metabolism is how your body handles food. It controls metabolism with the hormone thyroxine. When this gland is underactive (hypothyroid), it produces too little hormone.  CAUSES These include:   Absence or destruction of thyroid tissue.  Goiter due to iodine deficiency.  Goiter due to medications.  Congenital defects (since birth).  Problems with the pituitary. This causes a lack of TSH (thyroid stimulating hormone). This hormone tells the thyroid to turn out more hormone. SYMPTOMS  Lethargy (feeling as though you have no energy)  Cold intolerance  Weight gain (in spite of normal food intake)  Dry skin  Coarse hair  Menstrual irregularity (if severe, may lead to infertility)  Slowing of thought processes Cardiac problems are also caused by insufficient amounts of thyroid hormone. Hypothyroidism in the newborn is cretinism, and is an extreme form. It is important that this form be treated adequately and immediately or it will lead rapidly to retarded physical and mental development. DIAGNOSIS  To prove hypothyroidism, your caregiver may do blood tests and ultrasound tests. Sometimes the signs are hidden. It may be necessary for your caregiver to watch this illness with blood tests either before or after diagnosis and treatment. TREATMENT  Low levels of thyroid hormone are increased by using synthetic thyroid hormone. This is a safe, effective treatment. It usually takes about four weeks to gain the full effects of the medication. After you have the full effect of the medication, it will generally take another four weeks for problems to leave. Your caregiver may start you on low doses. If you have had heart problems the dose may be gradually increased. It is generally not an emergency to get rapidly to normal. HOME CARE INSTRUCTIONS   Take your  medications as your caregiver suggests. Let your caregiver know of any medications you are taking or start taking. Your caregiver will help you with dosage schedules.  As your condition improves, your dosage needs may increase. It will be necessary to have continuing blood tests as suggested by your caregiver.  Report all suspected medication side effects to your caregiver. SEEK MEDICAL CARE IF: Seek medical care if you develop:  Sweating.  Tremulousness (tremors).  Anxiety.  Rapid weight loss.  Heat intolerance.  Emotional swings.  Diarrhea.  Weakness. SEEK IMMEDIATE MEDICAL CARE IF:  You develop chest pain, an irregular heart beat (palpitations), or a rapid heart beat. MAKE SURE YOU:   Understand these instructions.  Will watch your condition.  Will get help right away if you are not doing well or get worse. Document Released: 10/31/2005 Document Revised: 01/23/2012 Document Reviewed: 06/20/2008 ExitCare Patient Information 2015 [employer]. This information is not intended to replace advice given to you by your health care provider. Make sure you discuss any questions you have with your health care provider.  

## 2015-06-12 ENCOUNTER — Telehealth: Payer: Self-pay | Admitting: Family Medicine

## 2015-06-12 NOTE — Telephone Encounter (Signed)
Relation to pt: self  Call back number: [phone] Pharmacy: [id number] [place], [place] [phone] (Phone) [phone] (Fax)         Reason for call:  Patient requesting a increase in dosage of mirtazapine (REMERON) 15 MG tablet (completelty out) patient states she is taking 3 or 4 to help her sleep. Patient alternates between amitriptyline (ELAVIL) 100 MG tablet [ but does not take together. Please advise

## 2015-06-12 NOTE — Telephone Encounter (Signed)
Please advise 

## 2015-06-14 NOTE — Telephone Encounter (Signed)
No both of these meds are meant to be taken daily. She could give up Trazodone and take 3 of the 15 mg Mirtazapine qhs (not 4 that is too hi) every night. Cannot take together at hi doses. She can come in to discuss but cannot refill at current doses.

## 2015-06-15 DIAGNOSIS — G894 Chronic pain syndrome: Secondary | ICD-10-CM | POA: Diagnosis not present

## 2015-06-15 DIAGNOSIS — M4696 Unspecified inflammatory spondylopathy, lumbar region: Secondary | ICD-10-CM | POA: Diagnosis not present

## 2015-06-15 DIAGNOSIS — Z1239 Encounter for other screening for malignant neoplasm of breast: Secondary | ICD-10-CM | POA: Insufficient documentation

## 2015-06-15 DIAGNOSIS — M7989 Other specified soft tissue disorders: Secondary | ICD-10-CM | POA: Insufficient documentation

## 2015-06-15 DIAGNOSIS — M1710 Unilateral primary osteoarthritis, unspecified knee: Secondary | ICD-10-CM | POA: Diagnosis not present

## 2015-06-15 DIAGNOSIS — M545 Low back pain: Secondary | ICD-10-CM | POA: Diagnosis not present

## 2015-06-15 NOTE — Assessment & Plan Note (Signed)
Feels like fatty tissue, no concerning lesions palpated.

## 2015-06-15 NOTE — Assessment & Plan Note (Signed)
Multifactorial managing with current meds.

## 2015-06-15 NOTE — Assessment & Plan Note (Signed)
On Levothyroxine, continue to monitor 

## 2015-06-15 NOTE — Assessment & Plan Note (Signed)
MGM ordered today 

## 2015-06-15 NOTE — Assessment & Plan Note (Signed)
Avoid offending foods, take probiotics. Do not eat large meals in late evening and consider raising head of bed. Continue Ranitidine.

## 2015-06-15 NOTE — Telephone Encounter (Signed)
Yes she can take Mirtazapine 15 mg tabs 3 tabs po qhs, disp 30 day supply with 1 rf, d/c Trazodone

## 2015-06-15 NOTE — Telephone Encounter (Signed)
Called patient at Call back number: [phone] and left message to return call when available.

## 2015-06-15 NOTE — Assessment & Plan Note (Signed)
Minimize simple carbs 

## 2015-06-15 NOTE — Progress Notes (Signed)
Kayla [name]  [id number] 1946-08-11 06/15/2015      Progress Note-Follow Up  Subjective  Chief Complaint  Chief Complaint  Patient presents with  . Follow-up    2 month    HPI  Patient is a 69 y.o. female in today for routine medical care. Patient in today with numerous stable concerns, regarding pain and fatigue. Has ongoing depression and anxiety. No suicidal ideation or homicidal ideation. Is concerned because her right axillae feels swollen to her, no trauma, only mild discomfort. Denies CP/palp/SOB/HA/congestion/fevers/GI or GU c/o. Taking meds as prescribed  Past Medical History  Diagnosis Date  . Allergy     allergic rhinitis  . Anemia     NOS  . Depression   . GERD (gastroesophageal reflux disease)   . Hyperlipidemia   . Thyroid disease     hypothyroidism  . Arthritis     osteoarthritis  . Hx of colonic polyps   . History of diverticulitis of colon   . Headache(784.0)   . Urinary incontinence   . Migraine   . Fibromyalgia   . Miscarriage   . Depression with anxiety 01/19/2009    Qualifier: Diagnosis of  By: Redmond Pulling MD, Frann Rider    . Obesity, unspecified 10/26/2013  . Asthma   . Hyperglycemia 11/23/2014  . Otitis, externa, infective 11/23/2014    Past Surgical History  Procedure Laterality Date  . Cholecystectomy    . Abdominal hysterectomy    . Tonsillectomy      Family History  Problem Relation Age of Onset  . Cancer Other     female, brain, lung  . Heart disease Other   . Stroke Other   . Diabetes Other   . Arthritis Other   . Hyperlipidemia Other   . Hypertension Other   . Alzheimer's disease Mother   . Alzheimer's disease Father     History   Social History  . Marital Status: Divorced    Spouse Name: N/A  . Number of Children: N/A  . Years of Education: N/A   Occupational History  . unemployed    Social History Main Topics  . Smoking status: Never Smoker   . Smokeless tobacco: Never Used  . Alcohol Use: Yes  . Drug Use: Not on  file  . Sexual Activity: Not on file   Other Topics Concern  . Not on file   Social History Narrative    Current Outpatient Prescriptions on File Prior to Visit  Medication Sig Dispense Refill  . amitriptyline (ELAVIL) 100 MG tablet Take 1 tablet (100 mg total) by mouth at bedtime. 30 tablet 5  . carisoprodol (SOMA) 350 MG tablet Take 1 tablet (350 mg total) by mouth at bedtime as needed for muscle spasms. alternates with Flexeril 30 tablet 5  . clonazePAM (KLONOPIN) 1 MG tablet Take 1 tablet (1 mg total) by mouth 3 (three) times daily. 90 tablet 4  . cyclobenzaprine (FLEXERIL) 10 MG tablet Take 1 tablet by mouth at bedtime as needed. Alternate with SOMA.    . DULoxetine (CYMBALTA) 60 MG capsule Take 1 capsule (60 mg total) by mouth daily. 90 capsule 1  . fluconazole (DIFLUCAN) 150 MG tablet Take 2 tablets (300 mg total) by mouth once a week. 4 tablet 2  . fluticasone-salmeterol (ADVAIR HFA) 45-21 MCG/ACT inhaler Inhale 2 puffs into the lungs 2 (two) times daily. 1 Inhaler 12  . gabapentin (NEURONTIN) 300 MG capsule Take 900 mg by mouth 3 (three) times daily.     Marland Kitchen  levothyroxine (SYNTHROID, LEVOTHROID) 75 MCG tablet TAKE 1 TABLET (75 MCG TOTAL) BY MOUTH DAILY. 30 tablet 3  . NUCYNTA ER 100 MG TB12 Take 1 tablet by mouth every 12 (twelve) hours.  0  . ondansetron (ZOFRAN) 4 MG tablet Take 1 tablet (4 mg total) by mouth every 8 (eight) hours as needed for nausea. 30 tablet 0  . topiramate (TOPAMAX) 25 MG tablet TAKE 1 TABLET (25 MG TOTAL) BY MOUTH 2 (TWO) TIMES DAILY. 60 tablet 2  . atorvastatin (LIPITOR) 10 MG tablet Take 1 tablet (10 mg total) by mouth daily. (Patient not taking: Reported on 02/19/2015) 30 tablet 3  . diclofenac (VOLTAREN) 75 MG EC tablet Take 1 tablet (75 mg total) by mouth 2 (two) times daily. (Patient not taking: Reported on 04/02/2015) 30 tablet 0  . hyoscyamine (LEVSIN SL) 0.125 MG SL tablet Place 1 tablet (0.125 mg total) under the tongue every 4 (four) hours as needed.  (Patient not taking: Reported on 04/02/2015) 30 tablet 1  . ranitidine (ZANTAC) 300 MG tablet Take 1 tablet (300 mg total) by mouth at bedtime. (Patient not taking: Reported on 04/02/2015) 30 tablet 3   No current facility-administered medications on file prior to visit.    Allergies  Allergen Reactions  . Adhesive [Tape]     blisters  . Penicillins     REACTION: rash  . Promethazine Hcl Other (See Comments)    Jerky movements    Review of Systems  Review of Systems  Constitutional: Positive for malaise/fatigue. Negative for fever.  HENT: Negative for congestion.   Eyes: Negative for discharge.  Respiratory: Negative for shortness of breath.   Cardiovascular: Negative for chest pain, palpitations and leg swelling.  Gastrointestinal: Positive for nausea. Negative for vomiting, abdominal pain, diarrhea, constipation, blood in stool and melena.  Genitourinary: Negative for dysuria.  Musculoskeletal: Positive for myalgias, back pain, joint pain and neck pain. Negative for falls.  Skin: Negative for rash.  Neurological: Negative for loss of consciousness and headaches.  Endo/Heme/Allergies: Negative for polydipsia.  Psychiatric/Behavioral: Positive for depression. Negative for suicidal ideas. The patient is nervous/anxious and has insomnia.     Objective  BP 112/72 mmHg  Pulse 80  Temp(Src) 98.2 F (36.8 C) (Oral)  Ht 5\' 7"  (1.702 m)  Wt 235 lb (106.595 kg)  BMI 36.80 kg/m2  SpO2 97%  LMP 11/14/1978  Physical Exam  Physical Exam  Constitutional: She is oriented to person, place, and time and well-developed, well-nourished, and in no distress. No distress.  HENT:  Head: Normocephalic and atraumatic.  Eyes: Conjunctivae are normal.  Neck: Neck supple. No thyromegaly present.  Cardiovascular: Normal rate, regular rhythm and normal heart sounds.   No murmur heard. Pulmonary/Chest: Effort normal and breath sounds normal. She has no wheezes.  Abdominal: She exhibits no  distension and no mass.  Genitourinary:  Breast exam does not show any lesions. Excess fatty tissue noted in right axillae. No skin lesions or breast discharge.   Musculoskeletal: She exhibits no edema.  Lymphadenopathy:    She has no cervical adenopathy.  Neurological: She is alert and oriented to person, place, and time.  Skin: Skin is warm and dry. No rash noted. She is not diaphoretic.  Psychiatric: Memory, affect and judgment normal.    Lab Results  Component Value Date   TSH 0.19* 03/20/2015   Lab Results  Component Value Date   WBC 9.0 03/20/2015   HGB 14.7 03/20/2015   HCT 43.8 03/20/2015   MCV 82.3  03/20/2015   PLT 302.0 03/20/2015   Lab Results  Component Value Date   CREATININE 0.88 03/20/2015   BUN 16 03/20/2015   NA 139 03/20/2015   K 4.3 03/20/2015   CL 103 03/20/2015   CO2 31 03/20/2015   Lab Results  Component Value Date   ALT 17 03/20/2015   AST 15 03/20/2015   ALKPHOS 85 03/20/2015   BILITOT 0.6 03/20/2015   Lab Results  Component Value Date   CHOL 278* 03/20/2015   Lab Results  Component Value Date   HDL 69.60 03/20/2015   Lab Results  Component Value Date   LDLCALC 186* 03/20/2015   Lab Results  Component Value Date   TRIG 113.0 03/20/2015   Lab Results  Component Value Date   CHOLHDL 4 03/20/2015     Assessment & Plan  Hypothyroidism On Levothyroxine, continue to monitor  GERD Avoid offending foods, take probiotics. Do not eat large meals in late evening and consider raising head of bed. Continue Ranitidine.  Obesity Encouraged DASH diet, decrease po intake and increase exercise as tolerated. Needs 7-8 hours of sleep nightly. Avoid trans fats, eat small, frequent meals every 4-5 hours with lean proteins, complex carbs and healthy fats. Minimize simple carbs  Right axillary swelling Feels like fatty tissue, no concerning lesions palpated.   Hyperglycemia Minimize simple carbs.  Breast cancer screening MGM ordered  today.   Chronic pain syndrome Multifactorial managing with current meds.

## 2015-06-15 NOTE — Telephone Encounter (Signed)
Spoke with patient to clarify - Patient states that she is taking 3 Remeron nightly (even though it was prescribed as 1).  She states that this is the first time in years that she is able to get sleep.  She is not taking any other sleep medications at the same time as the Remeron.  She alternates nights with the Elavil.  She would like to know if the Remeron can be refilled since she is now out due to taking more than originally discussed.  She is willing to come to office to discuss as well.   Please advise.

## 2015-06-15 NOTE — Assessment & Plan Note (Signed)
Encouraged DASH diet, decrease po intake and increase exercise as tolerated. Needs 7-8 hours of sleep nightly. Avoid trans fats, eat small, frequent meals every 4-5 hours with lean proteins, complex carbs and healthy fats. Minimize simple carbs 

## 2015-06-16 MED ORDER — MIRTAZAPINE 15 MG PO TABS: 45.0000 mg | ORAL_TABLET | Freq: Every day | ORAL | Status: DC

## 2015-06-16 NOTE — Telephone Encounter (Signed)
Rx sent.  Called patient and left detailed message (per her request) to notify her that Rx was ready and remind not to take any other medications at same time (Trazodone not listed in med list).

## 2015-06-17 ENCOUNTER — Other Ambulatory Visit: Payer: Self-pay | Admitting: Family Medicine

## 2015-06-17 NOTE — Telephone Encounter (Signed)
Medication already filled.

## 2015-07-21 ENCOUNTER — Other Ambulatory Visit: Payer: Self-pay | Admitting: Family Medicine

## 2015-08-19 ENCOUNTER — Other Ambulatory Visit: Payer: Self-pay | Admitting: Family Medicine

## 2015-10-15 DIAGNOSIS — Z6837 Body mass index (BMI) 37.0-37.9, adult: Secondary | ICD-10-CM | POA: Diagnosis not present

## 2015-10-15 DIAGNOSIS — G894 Chronic pain syndrome: Secondary | ICD-10-CM | POA: Diagnosis not present

## 2015-10-15 DIAGNOSIS — M4696 Unspecified inflammatory spondylopathy, lumbar region: Secondary | ICD-10-CM | POA: Diagnosis not present

## 2015-10-15 DIAGNOSIS — M1710 Unilateral primary osteoarthritis, unspecified knee: Secondary | ICD-10-CM | POA: Diagnosis not present

## 2015-10-30 ENCOUNTER — Other Ambulatory Visit: Payer: Self-pay | Admitting: Family Medicine

## 2015-10-30 NOTE — Telephone Encounter (Signed)
[facility]

## 2015-10-30 NOTE — Telephone Encounter (Signed)
[facility] [place] 

## 2015-12-01 ENCOUNTER — Other Ambulatory Visit: Payer: Self-pay | Admitting: Family Medicine

## 2015-12-01 ENCOUNTER — Ambulatory Visit (HOSPITAL_BASED_OUTPATIENT_CLINIC_OR_DEPARTMENT_OTHER)
Admission: RE | Admit: 2015-12-01 | Discharge: 2015-12-01 | Disposition: A | Discharge status: Home / Self Care | Payer: Medicare Other | Source: Ambulatory Visit | Attending: Family Medicine | Admitting: Family Medicine

## 2015-12-01 ENCOUNTER — Ambulatory Visit (INDEPENDENT_AMBULATORY_CARE_PROVIDER_SITE_OTHER): Payer: Medicare Other | Admitting: Family Medicine

## 2015-12-01 ENCOUNTER — Encounter: Payer: Self-pay | Admitting: Family Medicine

## 2015-12-01 VITALS — BP 127/77 | HR 95 | Temp 98.5°F | Ht 67.0 in | Wt 235.0 lb

## 2015-12-01 DIAGNOSIS — J349 Unspecified disorder of nose and nasal sinuses: Secondary | ICD-10-CM | POA: Insufficient documentation

## 2015-12-01 DIAGNOSIS — D649 Anemia, unspecified: Secondary | ICD-10-CM

## 2015-12-01 DIAGNOSIS — M545 Low back pain, unspecified: Secondary | ICD-10-CM

## 2015-12-01 DIAGNOSIS — Z1239 Encounter for other screening for malignant neoplasm of breast: Secondary | ICD-10-CM

## 2015-12-01 DIAGNOSIS — M5136 Other intervertebral disc degeneration, lumbar region: Secondary | ICD-10-CM

## 2015-12-01 DIAGNOSIS — Z9181 History of falling: Secondary | ICD-10-CM | POA: Insufficient documentation

## 2015-12-01 DIAGNOSIS — M4316 Spondylolisthesis, lumbar region: Secondary | ICD-10-CM | POA: Diagnosis not present

## 2015-12-01 DIAGNOSIS — M1388 Other specified arthritis, other site: Secondary | ICD-10-CM | POA: Diagnosis not present

## 2015-12-01 DIAGNOSIS — K449 Diaphragmatic hernia without obstruction or gangrene: Secondary | ICD-10-CM

## 2015-12-01 DIAGNOSIS — M25511 Pain in right shoulder: Secondary | ICD-10-CM | POA: Insufficient documentation

## 2015-12-01 DIAGNOSIS — S0992XA Unspecified injury of nose, initial encounter: Secondary | ICD-10-CM | POA: Diagnosis not present

## 2015-12-01 DIAGNOSIS — R3989 Other symptoms and signs involving the genitourinary system: Secondary | ICD-10-CM

## 2015-12-01 DIAGNOSIS — M5137 Other intervertebral disc degeneration, lumbosacral region: Secondary | ICD-10-CM | POA: Diagnosis not present

## 2015-12-01 DIAGNOSIS — M19011 Primary osteoarthritis, right shoulder: Secondary | ICD-10-CM | POA: Insufficient documentation

## 2015-12-01 DIAGNOSIS — K219 Gastro-esophageal reflux disease without esophagitis: Secondary | ICD-10-CM

## 2015-12-01 DIAGNOSIS — R739 Hyperglycemia, unspecified: Secondary | ICD-10-CM

## 2015-12-01 MED ORDER — CARISOPRODOL 350 MG PO TABS
ORAL_TABLET | ORAL | Status: DC
Start: 2015-12-01 — End: 2017-05-30

## 2015-12-01 MED ORDER — DICLOFENAC SODIUM 75 MG PO TBEC: 75.0000 mg | DELAYED_RELEASE_TABLET | Freq: Two times a day (BID) | ORAL | Status: DC

## 2015-12-01 MED ORDER — DULOXETINE HCL 60 MG PO CPEP: 60.0000 mg | ORAL_CAPSULE | Freq: Every day | ORAL | Status: DC

## 2015-12-01 MED ORDER — CLONAZEPAM 1 MG PO TABS: 1.0000 mg | ORAL_TABLET | Freq: Three times a day (TID) | ORAL | Status: DC

## 2015-12-01 MED ORDER — DULOXETINE HCL 30 MG PO CPEP: 30.0000 mg | ORAL_CAPSULE | Freq: Every day | ORAL | Status: DC

## 2015-12-01 NOTE — Progress Notes (Signed)
Pre visit review using our clinic review tool, if applicable. No additional management support is needed unless otherwise documented below in the visit note. 

## 2015-12-01 NOTE — Patient Instructions (Signed)

## 2015-12-01 NOTE — Progress Notes (Signed)
Subjective:    Patient ID: Kayla [name], female    DOB: [birth year], 70 y.o.   MRN: [id number]  Chief Complaint  Patient presents with  . Fall  . Back Pain    HPI Patient is in today for evaluation of numerous concerns. She has had several falls lately and is complaining of some low back pain. She has a long-standing history of fibromyalgia and continues to have diffuse joint and muscle pain as well as fatigue. She also notes some congestion most notably head congestion but no fevers or chills. No green rhinorrhea. Continues to struggle with stress and anxiety. Follows with pain management. Has had some episodes of excessive somnolence. Denies CP/palp/SOB/HA/congestion/fevers/GI or GU c/o. Taking meds as prescribed  Past Medical History  Diagnosis Date  . Allergy     allergic rhinitis  . Anemia     NOS  . Depression   . GERD (gastroesophageal reflux disease)   . Hyperlipidemia   . Thyroid disease     hypothyroidism  . Arthritis     osteoarthritis  . Hx of colonic polyps   . History of diverticulitis of colon   . Headache(784.0)   . Urinary incontinence   . Migraine   . Fibromyalgia   . Miscarriage   . Depression with anxiety 01/19/2009    Qualifier: Diagnosis of  By: Redmond Pulling MD, Frann Rider    . Obesity, unspecified 10/26/2013  . Asthma   . Hyperglycemia 11/23/2014  . Otitis, externa, infective 11/23/2014  . Degenerative disc disease, lumbar 12/13/2015    Past Surgical History  Procedure Laterality Date  . Cholecystectomy    . Abdominal hysterectomy    . Tonsillectomy    . Bladder suspension      Family History  Problem Relation Age of Onset  . Cancer Other     female, brain, lung  . Heart disease Other   . Stroke Other   . Diabetes Other   . Arthritis Other   . Hyperlipidemia Other   . Hypertension Other   . Alzheimer's disease Mother   . Alzheimer's disease Father     Social History   Social History  . Marital Status: Divorced    Spouse Name: N/A  .  Number of Children: N/A  . Years of Education: N/A   Occupational History  . unemployed    Social History Main Topics  . Smoking status: Never Smoker   . Smokeless tobacco: Never Used  . Alcohol Use: Yes  . Drug Use: Not on file  . Sexual Activity: Not on file   Other Topics Concern  . Not on file   Social History Narrative    Outpatient Prescriptions Prior to Visit  Medication Sig Dispense Refill  . levothyroxine (SYNTHROID, LEVOTHROID) 75 MCG tablet TAKE 1 TABLET (75 MCG TOTAL) BY MOUTH DAILY. 30 tablet 6  . NUCYNTA ER 100 MG TB12 Take 1 tablet by mouth every 12 (twelve) hours.  0  . ondansetron (ZOFRAN) 4 MG tablet Take 1 tablet (4 mg total) by mouth every 8 (eight) hours as needed for nausea. 30 tablet 0  . ranitidine (ZANTAC) 300 MG tablet Take 1 tablet (300 mg total) by mouth at bedtime. 30 tablet 3  . topiramate (TOPAMAX) 25 MG tablet TAKE 1 TABLET (25 MG TOTAL) BY MOUTH 2 (TWO) TIMES DAILY. 60 tablet 2  . carisoprodol (SOMA) 350 MG tablet TAKE 1 TABLET BY MOUTH AT BEDTIME AS NEEDED FOR MUSCLE SPASM ALTERNATE WITH FLEXERIL 30 tablet 1  .  clonazePAM (KLONOPIN) 1 MG tablet Take 1 tablet (1 mg total) by mouth 3 (three) times daily. 90 tablet 4  . DULoxetine (CYMBALTA) 60 MG capsule Take 1 capsule (60 mg total) by mouth daily. 90 capsule 1  . amitriptyline (ELAVIL) 100 MG tablet Take 1 tablet (100 mg total) by mouth at bedtime. 30 tablet 5  . atorvastatin (LIPITOR) 10 MG tablet Take 1 tablet (10 mg total) by mouth daily. (Patient not taking: Reported on 02/19/2015) 30 tablet 3  . cyclobenzaprine (FLEXERIL) 10 MG tablet Reported on 12/01/2015    . fluconazole (DIFLUCAN) 150 MG tablet Take 2 tablets (300 mg total) by mouth once a week. (Patient not taking: Reported on 12/01/2015) 4 tablet 2  . fluticasone-salmeterol (ADVAIR HFA) 45-21 MCG/ACT inhaler Inhale 2 puffs into the lungs 2 (two) times daily. (Patient not taking: Reported on 12/01/2015) 1 Inhaler 12  . gabapentin (NEURONTIN) 300  MG capsule Take 900 mg by mouth at bedtime.     . hyoscyamine (LEVSIN SL) 0.125 MG SL tablet Place 1 tablet (0.125 mg total) under the tongue every 4 (four) hours as needed. (Patient not taking: Reported on 04/02/2015) 30 tablet 1  . diclofenac (VOLTAREN) 75 MG EC tablet Take 1 tablet (75 mg total) by mouth 2 (two) times daily. (Patient not taking: Reported on 12/01/2015) 30 tablet 0  . mirtazapine (REMERON) 15 MG tablet TAKE 3 TABLETS (45 MG TOTAL) BY MOUTH AT BEDTIME. 30 tablet 1   No facility-administered medications prior to visit.    Allergies  Allergen Reactions  . Adhesive [Tape]     blisters  . Penicillins     REACTION: rash  . Promethazine Hcl Other (See Comments)    Jerky movements    Review of Systems  Constitutional: Negative for fever and malaise/fatigue.  HENT: Positive for congestion.   Eyes: Negative for discharge.  Respiratory: Negative for shortness of breath.   Cardiovascular: Negative for chest pain, palpitations and leg swelling.  Gastrointestinal: Positive for abdominal pain. Negative for nausea.  Genitourinary: Positive for frequency. Negative for dysuria.  Musculoskeletal: Positive for myalgias, back pain, joint pain, falls and neck pain.  Skin: Negative for rash.  Neurological: Negative for loss of consciousness and headaches.  Endo/Heme/Allergies: Negative for environmental allergies.  Psychiatric/Behavioral: Negative for depression. The patient is not nervous/anxious.        Objective:    Physical Exam  Constitutional: She is oriented to person, place, and time. She appears well-developed and well-nourished. No distress.  HENT:  Head: Normocephalic and atraumatic.  Nose: Nose normal.  Eyes: Right eye exhibits no discharge. Left eye exhibits no discharge.  Neck: Normal range of motion. Neck supple.  Cardiovascular: Normal rate and regular rhythm.   No murmur heard. Pulmonary/Chest: Effort normal and breath sounds normal.  Abdominal: Soft. Bowel  sounds are normal. There is no tenderness.  Musculoskeletal: She exhibits no edema.  Neurological: She is alert and oriented to person, place, and time.  Skin: Skin is warm and dry.  Psychiatric: She has a normal mood and affect.  Nursing note and vitals reviewed.   BP 127/77 mmHg  Pulse 95  Temp(Src) 98.5 F (36.9 C) (Oral)  Ht 5\' 7"  (1.702 m)  Wt 235 lb (106.595 kg)  BMI 36.80 kg/m2  SpO2 99%  LMP 11/14/1978 Wt Readings from Last 3 Encounters:  12/01/15 235 lb (106.595 kg)  06/02/15 235 lb (106.595 kg)  04/02/15 239 lb 4 oz (108.523 kg)     Lab Results  Component  Value Date   WBC 10.4 12/01/2015   HGB 15.3* 12/01/2015   HCT 47.7* 12/01/2015   PLT 315.0 12/01/2015   GLUCOSE 89 12/01/2015   CHOL 278* 03/20/2015   TRIG 113.0 03/20/2015   HDL 69.60 03/20/2015   LDLCALC 186* 03/20/2015   ALT 27 12/01/2015   AST 24 12/01/2015   NA 141 12/01/2015   K 3.9 12/01/2015   CL 103 12/01/2015   CREATININE 0.99 12/01/2015   BUN 13 12/01/2015   CO2 28 12/01/2015   TSH 0.19* 03/20/2015   HGBA1C 5.7 11/20/2014    Lab Results  Component Value Date   TSH 0.19* 03/20/2015   Lab Results  Component Value Date   WBC 10.4 12/01/2015   HGB 15.3* 12/01/2015   HCT 47.7* 12/01/2015   MCV 85.3 12/01/2015   PLT 315.0 12/01/2015   Lab Results  Component Value Date   NA 141 12/01/2015   K 3.9 12/01/2015   CO2 28 12/01/2015   GLUCOSE 89 12/01/2015   BUN 13 12/01/2015   CREATININE 0.99 12/01/2015   BILITOT 0.5 12/01/2015   ALKPHOS 74 12/01/2015   AST 24 12/01/2015   ALT 27 12/01/2015   PROT 7.2 12/01/2015   ALBUMIN 4.0 12/01/2015   CALCIUM 9.4 12/01/2015   GFR 59.02* 12/01/2015   Lab Results  Component Value Date   CHOL 278* 03/20/2015   Lab Results  Component Value Date   HDL 69.60 03/20/2015   Lab Results  Component Value Date   LDLCALC 186* 03/20/2015   Lab Results  Component Value Date   TRIG 113.0 03/20/2015   Lab Results  Component Value Date    CHOLHDL 4 03/20/2015   Lab Results  Component Value Date   HGBA1C 5.7 11/20/2014       Assessment & Plan:   Problem List Items Addressed This Visit    Anemia   Relevant Orders   CBC with Differential/Platelet (Completed)   Comprehensive metabolic panel (Completed)   Urinalysis with Culture, if indicated (Completed)   Bladder pain   Relevant Orders   Urinalysis with Culture, if indicated (Completed)   Breast cancer screening   Degenerative disc disease, lumbar    Severe on xrays, due to symptoms and falls will proceed with MRI      Relevant Medications   carisoprodol (SOMA) 350 MG tablet   diclofenac (VOLTAREN) 75 MG EC tablet   Hiatal hernia with gastroesophageal reflux    Avoid offending foods, start probiotics. Do not eat large meals in late evening and consider raising head of bed.       Hyperglycemia    hgba1c acceptable, minimize simple carbs. Increase exercise as tolerated.       Osteoarthritis    Right shoulder xray confirms notable arthritis. Encouraged to stay active and topical treatments      Relevant Medications   carisoprodol (SOMA) 350 MG tablet   diclofenac (VOLTAREN) 75 MG EC tablet    Other Visit Diagnoses    Bilateral low back pain without sciatica    -  Primary    Relevant Medications    carisoprodol (SOMA) 350 MG tablet    diclofenac (VOLTAREN) 75 MG EC tablet    Other Relevant Orders    DG Lumbar Spine Complete (Completed)    Sinus disease        Pain in joint of right shoulder        Relevant Orders    DG Shoulder Right (Completed)  I have discontinued Ms. Kopp's mirtazapine. I am also having her start on DULoxetine. Additionally, I am having her maintain her cyclobenzaprine, gabapentin, ondansetron, hyoscyamine, ranitidine, fluticasone-salmeterol, atorvastatin, NUCYNTA ER, amitriptyline, fluconazole, topiramate, levothyroxine, DULoxetine, clonazePAM, carisoprodol, and diclofenac.  Meds ordered this encounter  Medications  .  DULoxetine (CYMBALTA) 60 MG capsule    Sig: Take 1 capsule (60 mg total) by mouth daily.    Dispense:  90 capsule    Refill:  1  . clonazePAM (KLONOPIN) 1 MG tablet    Sig: Take 1 tablet (1 mg total) by mouth 3 (three) times daily.    Dispense:  90 tablet    Refill:  4    Not to exceed 6 additional fills before 12/08/2014.  . carisoprodol (SOMA) 350 MG tablet    Sig: TAKE 1 TABLET BY MOUTH AT BEDTIME AS NEEDED FOR MUSCLE SPASM ALTERNATE WITH FLEXERIL    Dispense:  30 tablet    Refill:  1    This request is for a new prescription for a controlled substance as required by Federal/State law.  . diclofenac (VOLTAREN) 75 MG EC tablet    Sig: Take 1 tablet (75 mg total) by mouth 2 (two) times daily.    Dispense:  30 tablet    Refill:  0  . DULoxetine (CYMBALTA) 30 MG capsule    Sig: Take 1 capsule (30 mg total) by mouth daily.    Dispense:  30 capsule    Refill:  2    Willette Alma, MD

## 2015-12-02 ENCOUNTER — Other Ambulatory Visit: Payer: Self-pay | Admitting: Family Medicine

## 2015-12-02 ENCOUNTER — Telehealth: Payer: Self-pay | Admitting: Family Medicine

## 2015-12-02 ENCOUNTER — Other Ambulatory Visit: Payer: Self-pay

## 2015-12-02 DIAGNOSIS — M5136 Other intervertebral disc degeneration, lumbar region: Secondary | ICD-10-CM

## 2015-12-02 DIAGNOSIS — M545 Low back pain: Secondary | ICD-10-CM

## 2015-12-02 LAB — COMPREHENSIVE METABOLIC PANEL
ALK PHOS: 74 U/L (ref 39–117)
ALT: 27 U/L (ref 0–35)
AST: 24 U/L (ref 0–37)
Albumin: 4 g/dL (ref 3.5–5.2)
BUN: 13 mg/dL (ref 6–23)
CO2: 28 meq/L (ref 19–32)
Calcium: 9.4 mg/dL (ref 8.4–10.5)
Chloride: 103 mEq/L (ref 96–112)
Creatinine, Ser: 0.99 mg/dL (ref 0.40–1.20)
GFR: 59.02 mL/min — AB (ref >60.00)
GLUCOSE: 89 mg/dL (ref 70–99)
POTASSIUM: 3.9 meq/L (ref 3.5–5.1)
SODIUM: 141 meq/L (ref 135–145)
TOTAL PROTEIN: 7.2 g/dL (ref 6.0–8.3)
Total Bilirubin: 0.5 mg/dL (ref 0.2–1.2)

## 2015-12-02 LAB — CBC WITH DIFFERENTIAL/PLATELET
BASOS ABS: 0.1 10*3/uL (ref 0.0–0.1)
Basophils Relative: 1.1 % (ref 0.0–3.0)
Eosinophils Absolute: 0.2 10*3/uL (ref 0.0–0.7)
Eosinophils Relative: 1.6 % (ref 0.0–5.0)
HCT: 47.7 % — ABNORMAL HIGH (ref 36.0–46.0)
Hemoglobin: 15.3 g/dL — ABNORMAL HIGH (ref 12.0–15.0)
LYMPHS ABS: 3 10*3/uL (ref 0.7–4.0)
Lymphocytes Relative: 29.3 % (ref 12.0–46.0)
MCHC: 32.1 g/dL (ref 30.0–36.0)
MCV: 85.3 fl (ref 78.0–100.0)
MONO ABS: 0.7 10*3/uL (ref 0.1–1.0)
Monocytes Relative: 7.2 % (ref 3.0–12.0)
NEUTROS ABS: 6.3 10*3/uL (ref 1.4–7.7)
NEUTROS PCT: 60.8 % (ref 43.0–77.0)
PLATELETS: 315 10*3/uL (ref 150.0–400.0)
RBC: 5.59 Mil/uL — AB (ref 3.87–5.11)
RDW: 13.9 % (ref 11.5–15.5)
WBC: 10.4 10*3/uL (ref 4.0–10.5)

## 2015-12-02 LAB — URINALYSIS WITH CULTURE, IF INDICATED
HGB URINE DIPSTICK: NEGATIVE
KETONES UR: NEGATIVE
LEUKOCYTES UA: NEGATIVE
NITRITE: NEGATIVE
RBC / HPF: NONE SEEN (ref 0–?)
Specific Gravity, Urine: 1.02 (ref 1.000–1.030)
Total Protein, Urine: NEGATIVE
URINE GLUCOSE: NEGATIVE
Urobilinogen, UA: 0.2 (ref 0.0–1.0)
pH: 6 (ref 5.0–8.0)

## 2015-12-02 NOTE — Telephone Encounter (Signed)
Relation to [id number] Call back [phone]   Reason for call:  Patient states she missed a call regarding her x ray results, please advise

## 2015-12-13 ENCOUNTER — Encounter: Payer: Self-pay | Admitting: Family Medicine

## 2015-12-13 DIAGNOSIS — M51369 Other intervertebral disc degeneration, lumbar region without mention of lumbar back pain or lower extremity pain: Secondary | ICD-10-CM

## 2015-12-13 DIAGNOSIS — M5136 Other intervertebral disc degeneration, lumbar region: Secondary | ICD-10-CM

## 2015-12-13 HISTORY — DX: Other intervertebral disc degeneration, lumbar region without mention of lumbar back pain or lower extremity pain: M51.369

## 2015-12-13 HISTORY — DX: Other intervertebral disc degeneration, lumbar region: M51.36

## 2015-12-13 NOTE — Assessment & Plan Note (Signed)
Avoid offending foods, start probiotics. Do not eat large meals in late evening and consider raising head of bed.  

## 2015-12-13 NOTE — Assessment & Plan Note (Addendum)
Follows with pain management and receives her Nucynta there and other meds here. Discussed at length the need for her to consider cutting down on the doses of numerous meds to see if her falls improve. Encouraged to start by cutting down on Flexeril and Conazepam by 1/2 dose at a time.

## 2015-12-13 NOTE — Assessment & Plan Note (Signed)
hgba1c acceptable, minimize simple carbs. Increase exercise as tolerated.  

## 2015-12-13 NOTE — Assessment & Plan Note (Signed)
Right shoulder xray confirms notable arthritis. Encouraged to stay active and topical treatments

## 2015-12-13 NOTE — Assessment & Plan Note (Signed)
Severe on xrays, due to symptoms and falls will proceed with MRI

## 2015-12-14 ENCOUNTER — Other Ambulatory Visit: Payer: Self-pay | Admitting: Family Medicine

## 2015-12-26 ENCOUNTER — Ambulatory Visit (HOSPITAL_BASED_OUTPATIENT_CLINIC_OR_DEPARTMENT_OTHER)
Admission: RE | Admit: 2015-12-26 | Discharge: 2015-12-26 | Disposition: A | Discharge status: Home / Self Care | Payer: Medicare Other | Source: Ambulatory Visit | Attending: Family Medicine | Admitting: Family Medicine

## 2015-12-26 DIAGNOSIS — M797 Fibromyalgia: Secondary | ICD-10-CM | POA: Insufficient documentation

## 2015-12-26 DIAGNOSIS — M5136 Other intervertebral disc degeneration, lumbar region: Secondary | ICD-10-CM | POA: Diagnosis not present

## 2015-12-26 DIAGNOSIS — M8938 Hypertrophy of bone, other site: Secondary | ICD-10-CM | POA: Insufficient documentation

## 2015-12-26 DIAGNOSIS — M545 Low back pain: Secondary | ICD-10-CM | POA: Insufficient documentation

## 2015-12-26 DIAGNOSIS — M4806 Spinal stenosis, lumbar region: Secondary | ICD-10-CM | POA: Diagnosis not present

## 2015-12-26 DIAGNOSIS — G8929 Other chronic pain: Secondary | ICD-10-CM | POA: Diagnosis not present

## 2015-12-26 DIAGNOSIS — M5137 Other intervertebral disc degeneration, lumbosacral region: Secondary | ICD-10-CM | POA: Diagnosis not present

## 2015-12-28 ENCOUNTER — Other Ambulatory Visit: Payer: Self-pay | Admitting: Family Medicine

## 2015-12-28 DIAGNOSIS — M48061 Spinal stenosis, lumbar region without neurogenic claudication: Secondary | ICD-10-CM

## 2015-12-28 DIAGNOSIS — M545 Low back pain: Secondary | ICD-10-CM

## 2016-01-05 ENCOUNTER — Ambulatory Visit: Payer: Self-pay | Admitting: Family Medicine

## 2016-01-07 ENCOUNTER — Telehealth: Payer: Self-pay | Admitting: Family Medicine

## 2016-01-07 DIAGNOSIS — R4182 Altered mental status, unspecified: Secondary | ICD-10-CM | POA: Diagnosis not present

## 2016-01-07 NOTE — Telephone Encounter (Signed)
Noted. Will route to PCP for FYI.  

## 2016-01-07 NOTE — Telephone Encounter (Signed)
Patient Name: Kayla [name] DOB: 04-Feb-1946 Initial Comment Caller states, slurred speech, thinks she had a stroke , this has been going on a couple of weeks , she is trying to get an appt to see her Dr next week, afternoons if possible Nurse Assessment Nurse: Vallery Sa, RN, [facility] [place] Date/Time (Eastern Time): 01/07/2016 1:21:00 PM Confirm and document reason for call. If symptomatic, describe symptoms. You must click the next button to save text entered. ---Son states [name] developed slurred speech and confusion last night. No severe breathing difficulty. Has the patient traveled out of the country within the last 30 days? ---No Does the patient have any new or worsening symptoms? ---Yes Will a triage be completed? ---Yes Related visit to physician within the last 2 weeks? ---No Does the PT have any chronic conditions? (i.e. diabetes, asthma, etc.) ---Yes List chronic conditions. ---Fibromyalgia, Spinal Stenosis, Asthma? Is this a behavioral health or substance abuse call? ---No Guidelines Guideline Title Affirmed Question Affirmed Notes Neurologic Deficit Headache (and neurologic deficit) Final Disposition User Go to ED Now Vallery Sa, RN, [employer] states they are in [facility] and will go to ER there. Referrals GO TO FACILITY OTHER - SPECIFY Disagree/Comply: Comply

## 2016-01-07 NOTE — Telephone Encounter (Signed)
Patient called stating that she thinks she has had a stroke.  Patients does have slurred speech. Wanted to see dr, but was transferred to [facility], spoke with Jori Moll

## 2016-01-29 DIAGNOSIS — M6250 Muscle wasting and atrophy, not elsewhere classified, unspecified site: Secondary | ICD-10-CM | POA: Diagnosis not present

## 2016-01-29 DIAGNOSIS — M1710 Unilateral primary osteoarthritis, unspecified knee: Secondary | ICD-10-CM | POA: Diagnosis not present

## 2016-01-29 DIAGNOSIS — M4696 Unspecified inflammatory spondylopathy, lumbar region: Secondary | ICD-10-CM | POA: Diagnosis not present

## 2016-01-29 DIAGNOSIS — G894 Chronic pain syndrome: Secondary | ICD-10-CM | POA: Diagnosis not present

## 2016-01-29 DIAGNOSIS — Z6838 Body mass index (BMI) 38.0-38.9, adult: Secondary | ICD-10-CM | POA: Diagnosis not present

## 2016-02-05 ENCOUNTER — Ambulatory Visit: Payer: Medicare Other | Admitting: Family

## 2016-02-12 ENCOUNTER — Other Ambulatory Visit: Payer: Self-pay | Admitting: Family Medicine

## 2016-03-10 ENCOUNTER — Encounter: Payer: Self-pay | Admitting: Medical

## 2016-03-10 ENCOUNTER — Ambulatory Visit (INDEPENDENT_AMBULATORY_CARE_PROVIDER_SITE_OTHER): Payer: Medicare Other | Admitting: Medical

## 2016-03-10 ENCOUNTER — Ambulatory Visit (HOSPITAL_BASED_OUTPATIENT_CLINIC_OR_DEPARTMENT_OTHER)
Admission: RE | Admit: 2016-03-10 | Discharge: 2016-03-10 | Disposition: A | Discharge status: Home / Self Care | Payer: Medicare Other | Source: Ambulatory Visit | Attending: Medical | Admitting: Medical

## 2016-03-10 VITALS — BP 128/80 | HR 65 | Temp 98.1°F | Ht 67.0 in | Wt 228.0 lb

## 2016-03-10 DIAGNOSIS — R296 Repeated falls: Secondary | ICD-10-CM

## 2016-03-10 DIAGNOSIS — S3992XA Unspecified injury of lower back, initial encounter: Secondary | ICD-10-CM | POA: Diagnosis not present

## 2016-03-10 DIAGNOSIS — M533 Sacrococcygeal disorders, not elsewhere classified: Secondary | ICD-10-CM | POA: Insufficient documentation

## 2016-03-10 MED ORDER — DICLOFENAC SODIUM 75 MG PO TBEC: 75.0000 mg | DELAYED_RELEASE_TABLET | Freq: Two times a day (BID) | ORAL | Status: DC

## 2016-03-10 NOTE — Patient Instructions (Addendum)
For your coccyx pain will get xray today. Rx for donut.  For pain will rx diclofenac.  Will give rx for walker. Also will refer to PT for gait evaluation.  Follow up in 2 wks or as needed

## 2016-03-10 NOTE — Progress Notes (Signed)
Subjective:    Patient ID: Kayla [name], female    DOB: 1946-09-05, 70 y.o.   MRN: [id number]  HPI  Pt in states she has had some falls. She states fell before Easter. She fell on kitchen floor. She thinks she may have injured her coccyx. Pain on sitting. Pain has been present daily. But then today pain is some better.   Pt thinks maybe 10 falls in past year. No dizziness preceding falls. States that her legs feel weak at times and then looses balance.   Pt has some hx of spinal stenosis. But no lumbar pain. No leg numbness. No foot drop. No incontinence. No saddle anesthesia.     Review of Systems  Constitutional: Negative for fever, chills and fatigue.  Respiratory: Negative for cough, chest tightness, shortness of breath and wheezing.   Cardiovascular: Negative for chest pain and palpitations.  Musculoskeletal:       Coccyx pain.  Hx of spinal stenosis.  Skin: Negative for rash.  Neurological: Negative for dizziness and headaches.  Hematological: Negative for adenopathy. Does not bruise/bleed easily.  Psychiatric/Behavioral: Negative for behavioral problems and confusion.    Past Medical History  Diagnosis Date  . Allergy     allergic rhinitis  . Anemia     NOS  . Depression   . GERD (gastroesophageal reflux disease)   . Hyperlipidemia   . Thyroid disease     hypothyroidism  . Arthritis     osteoarthritis  . Hx of colonic polyps   . History of diverticulitis of colon   . Headache(784.0)   . Urinary incontinence   . Migraine   . Fibromyalgia   . Miscarriage   . Depression with anxiety 01/19/2009    Qualifier: Diagnosis of  By: Redmond Pulling MD, Frann Rider    . Obesity, unspecified 10/26/2013  . Asthma   . Hyperglycemia 11/23/2014  . Otitis, externa, infective 11/23/2014  . Degenerative disc disease, lumbar 12/13/2015     Social History   Social History  . Marital Status: Divorced    Spouse Name: N/A  . Number of Children: N/A  . Years of Education: N/A    Occupational History  . unemployed    Social History Main Topics  . Smoking status: Never Smoker   . Smokeless tobacco: Never Used  . Alcohol Use: Yes  . Drug Use: Not on file  . Sexual Activity: Not on file   Other Topics Concern  . Not on file   Social History Narrative    Past Surgical History  Procedure Laterality Date  . Cholecystectomy    . Abdominal hysterectomy    . Tonsillectomy    . Bladder suspension      Family History  Problem Relation Age of Onset  . Cancer Other     female, brain, lung  . Heart disease Other   . Stroke Other   . Diabetes Other   . Arthritis Other   . Hyperlipidemia Other   . Hypertension Other   . Alzheimer's disease Mother   . Alzheimer's disease Father     Allergies  Allergen Reactions  . Adhesive [Tape]     blisters  . Penicillins     REACTION: rash  . Promethazine Hcl Other (See Comments)    Jerky movements    Current Outpatient Prescriptions on File Prior to Visit  Medication Sig Dispense Refill  . amitriptyline (ELAVIL) 50 MG tablet TAKE 1-2 TABLETS (50-100 MG TOTAL) BY MOUTH AT BEDTIME. 60 tablet 1  .  carisoprodol (SOMA) 350 MG tablet TAKE 1 TABLET BY MOUTH AT BEDTIME AS NEEDED FOR MUSCLE SPASM ALTERNATE WITH FLEXERIL 30 tablet 1  . clonazePAM (KLONOPIN) 1 MG tablet Take 1 tablet (1 mg total) by mouth 3 (three) times daily. 90 tablet 4  . cyclobenzaprine (FLEXERIL) 10 MG tablet Reported on 12/01/2015    . diclofenac (VOLTAREN) 75 MG EC tablet Take 1 tablet (75 mg total) by mouth 2 (two) times daily. 30 tablet 0  . DULoxetine (CYMBALTA) 60 MG capsule Take 1 capsule (60 mg total) by mouth daily. 90 capsule 1  . fluconazole (DIFLUCAN) 150 MG tablet Take 2 tablets (300 mg total) by mouth once a week. 4 tablet 2  . fluticasone-salmeterol (ADVAIR HFA) 45-21 MCG/ACT inhaler Inhale 2 puffs into the lungs 2 (two) times daily. 1 Inhaler 12  . gabapentin (NEURONTIN) 300 MG capsule Take 900 mg by mouth at bedtime.     .  hyoscyamine (LEVSIN SL) 0.125 MG SL tablet Place 1 tablet (0.125 mg total) under the tongue every 4 (four) hours as needed. 30 tablet 1  . levothyroxine (SYNTHROID, LEVOTHROID) 75 MCG tablet TAKE 1 TABLET (75 MCG TOTAL) BY MOUTH DAILY. 30 tablet 6  . NUCYNTA ER 100 MG TB12 Take 1 tablet by mouth every 12 (twelve) hours.  0  . ondansetron (ZOFRAN) 4 MG tablet Take 1 tablet (4 mg total) by mouth every 8 (eight) hours as needed for nausea. 30 tablet 0  . ranitidine (ZANTAC) 300 MG tablet Take 1 tablet (300 mg total) by mouth at bedtime. 30 tablet 3  . topiramate (TOPAMAX) 25 MG tablet TAKE 1 TABLET (25 MG TOTAL) BY MOUTH 2 (TWO) TIMES DAILY. 60 tablet 2   No current facility-administered medications on file prior to visit.    BP 128/80 mmHg  Pulse 65  Temp(Src) 98.1 F (36.7 C) (Oral)  Ht 5\' 7"  (1.702 m)  Wt 228 lb (103.42 kg)  BMI 35.70 kg/m2  SpO2 98%  LMP 11/14/1978       Objective:   Physical Exam   General Mental Status- Alert. General Appearance- Not in acute distress.   Skin General: Color- Normal Color. Moisture- Normal Moisture.  Neck Carotid Arteries- Normal color. Moisture- Normal Moisture. No carotid bruits. No JVD.  Chest and Lung Exam Auscultation: Breath Sounds:-Normal.  Cardiovascular Auscultation:Rythm- Regular. Murmurs & Other Heart Sounds:Auscultation of the heart reveals- No Murmurs.  Abdomen Inspection:-Inspeection Normal. Palpation/Percussion:Note:No mass. Palpation and Percussion of the abdomen reveal- Non Tender, Non Distended + BS, no rebound or guarding.  Neurologic Cranial Nerve exam:- CN III-XII intact(No nystagmus), symmetric smile. Strength:- 5/5 equal and symmetric strength both upper and lower extremities.  Back- no lumbar pain on palpation.  On palpation coccyx region some distal tip tenderness.     Assessment & Plan:  For your coccyx pain will get xray today.  For pain will rx diclofenac.  Will give rx for walker. Also will  refer to PT for gait evaluation.  Follow up in 2 wks or as needed

## 2016-03-10 NOTE — Progress Notes (Signed)
Pre visit review using our clinic review tool, if applicable. No additional management support is needed unless otherwise documented below in the visit note. 

## 2016-03-16 ENCOUNTER — Other Ambulatory Visit: Payer: Self-pay | Admitting: Family Medicine

## 2016-03-28 ENCOUNTER — Encounter: Payer: Self-pay | Admitting: Family Medicine

## 2016-03-28 ENCOUNTER — Ambulatory Visit (INDEPENDENT_AMBULATORY_CARE_PROVIDER_SITE_OTHER): Payer: Medicare Other | Admitting: Family Medicine

## 2016-03-28 VITALS — BP 114/86 | HR 106 | Temp 98.7°F | Ht 67.0 in | Wt 225.0 lb

## 2016-03-28 DIAGNOSIS — B379 Candidiasis, unspecified: Secondary | ICD-10-CM | POA: Diagnosis not present

## 2016-03-28 DIAGNOSIS — E785 Hyperlipidemia, unspecified: Secondary | ICD-10-CM

## 2016-03-28 DIAGNOSIS — G609 Hereditary and idiopathic neuropathy, unspecified: Secondary | ICD-10-CM | POA: Diagnosis not present

## 2016-03-28 DIAGNOSIS — K219 Gastro-esophageal reflux disease without esophagitis: Secondary | ICD-10-CM

## 2016-03-28 DIAGNOSIS — R109 Unspecified abdominal pain: Secondary | ICD-10-CM

## 2016-03-28 DIAGNOSIS — D649 Anemia, unspecified: Secondary | ICD-10-CM

## 2016-03-28 DIAGNOSIS — E669 Obesity, unspecified: Secondary | ICD-10-CM

## 2016-03-28 DIAGNOSIS — R202 Paresthesia of skin: Secondary | ICD-10-CM

## 2016-03-28 DIAGNOSIS — G500 Trigeminal neuralgia: Secondary | ICD-10-CM

## 2016-03-28 DIAGNOSIS — R1310 Dysphagia, unspecified: Secondary | ICD-10-CM

## 2016-03-28 DIAGNOSIS — R32 Unspecified urinary incontinence: Secondary | ICD-10-CM

## 2016-03-28 DIAGNOSIS — K449 Diaphragmatic hernia without obstruction or gangrene: Secondary | ICD-10-CM

## 2016-03-28 DIAGNOSIS — M6281 Muscle weakness (generalized): Secondary | ICD-10-CM

## 2016-03-28 DIAGNOSIS — E039 Hypothyroidism, unspecified: Secondary | ICD-10-CM

## 2016-03-28 MED ORDER — FLUCONAZOLE 150 MG PO TABS: 300.0000 mg | ORAL_TABLET | ORAL | Status: DC

## 2016-03-28 NOTE — Progress Notes (Signed)
Pre visit review using our clinic review tool, if applicable. No additional management support is needed unless otherwise documented below in the visit note. 

## 2016-03-29 LAB — LIPID PANEL
CHOLESTEROL: 262 mg/dL — AB (ref 0–200)
HDL: 64.2 mg/dL (ref >39.00)
LDL Cholesterol: 176 mg/dL — ABNORMAL HIGH (ref 0–99)
NonHDL: 198.05
TRIGLYCERIDES: 110 mg/dL (ref 0.0–149.0)
Total CHOL/HDL Ratio: 4
VLDL: 22 mg/dL (ref 0.0–40.0)

## 2016-03-29 LAB — COMPREHENSIVE METABOLIC PANEL
ALBUMIN: 4.3 g/dL (ref 3.5–5.2)
ALT: 19 U/L (ref 0–35)
AST: 18 U/L (ref 0–37)
Alkaline Phosphatase: 81 U/L (ref 39–117)
BILIRUBIN TOTAL: 0.7 mg/dL (ref 0.2–1.2)
BUN: 14 mg/dL (ref 6–23)
CALCIUM: 9.6 mg/dL (ref 8.4–10.5)
CO2: 22 meq/L (ref 19–32)
CREATININE: 0.95 mg/dL (ref 0.40–1.20)
Chloride: 106 mEq/L (ref 96–112)
GFR: 61.84 mL/min (ref >60.00)
Glucose, Bld: 91 mg/dL (ref 70–99)
Potassium: 4.1 mEq/L (ref 3.5–5.1)
Sodium: 139 mEq/L (ref 135–145)
TOTAL PROTEIN: 7.4 g/dL (ref 6.0–8.3)

## 2016-03-29 LAB — CBC
HCT: 47.9 % — ABNORMAL HIGH (ref 36.0–46.0)
Hemoglobin: 15.9 g/dL — ABNORMAL HIGH (ref 12.0–15.0)
MCHC: 33.1 g/dL (ref 30.0–36.0)
MCV: 84.5 fl (ref 78.0–100.0)
Platelets: 324 10*3/uL (ref 150.0–400.0)
RBC: 5.67 Mil/uL — ABNORMAL HIGH (ref 3.87–5.11)
RDW: 13.9 % (ref 11.5–15.5)
WBC: 11.1 10*3/uL — ABNORMAL HIGH (ref 4.0–10.5)

## 2016-03-29 LAB — SEDIMENTATION RATE: SED RATE: 11 mm/h (ref 0–22)

## 2016-03-29 LAB — T4, FREE: Free T4: 1.01 ng/dL (ref 0.60–1.60)

## 2016-03-29 LAB — VITAMIN B12: Vitamin B-12: 324 pg/mL (ref 211–911)

## 2016-03-29 LAB — TSH: TSH: 0.16 u[IU]/mL — AB (ref 0.35–4.50)

## 2016-03-31 ENCOUNTER — Other Ambulatory Visit: Payer: Self-pay | Admitting: Family Medicine

## 2016-03-31 ENCOUNTER — Telehealth: Payer: Self-pay

## 2016-03-31 NOTE — Telephone Encounter (Signed)
[phone] returning your call about her labs

## 2016-03-31 NOTE — Telephone Encounter (Signed)
Patient informed labs dated 03/31/2016

## 2016-04-03 DIAGNOSIS — B379 Candidiasis, unspecified: Secondary | ICD-10-CM | POA: Insufficient documentation

## 2016-04-03 DIAGNOSIS — R1310 Dysphagia, unspecified: Secondary | ICD-10-CM | POA: Insufficient documentation

## 2016-04-03 DIAGNOSIS — G609 Hereditary and idiopathic neuropathy, unspecified: Secondary | ICD-10-CM | POA: Insufficient documentation

## 2016-04-03 NOTE — Assessment & Plan Note (Signed)
Referred to gastroenterology, avoid offending foods.

## 2016-04-03 NOTE — Assessment & Plan Note (Signed)
Patient continues to request [place] thyroid has been stable on it will monitor

## 2016-04-03 NOTE — Assessment & Plan Note (Signed)
Improved some.

## 2016-04-03 NOTE — Assessment & Plan Note (Signed)
Encouraged DASH diet, decrease po intake and increase exercise as tolerated. Needs 7-8 hours of sleep nightly. Avoid trans fats, eat small, frequent meals every 4-5 hours with lean proteins, complex carbs and healthy fats. Minimize simple carbs 

## 2016-04-03 NOTE — Assessment & Plan Note (Signed)
Amitriptyline

## 2016-04-03 NOTE — Progress Notes (Signed)
Patient ID: Kayla [name], female   DOB: 1946/08/05, 70 y.o.   MRN: [id number]   Subjective:    Patient ID: Kayla [name], female    DOB: 05/23/1946, 70 y.o.   MRN: [id number]  Chief Complaint  Patient presents with  . Fall    HPI Patient is in today for follow up. She is noting vaginal itching, discharge and irritation. No abdominal pain, constipation or urinary complaints. She is noting ongoing fatigue, weakness and falls. She continues to refuse physical therapy referrals. No significant injury or hospitalizations. Continues to struggle with fatigue and myalgias. No cp but does note congestion and cough  Past Medical History  Diagnosis Date  . Allergy     allergic rhinitis  . Anemia     NOS  . Depression   . GERD (gastroesophageal reflux disease)   . Hyperlipidemia   . Thyroid disease     hypothyroidism  . Arthritis     osteoarthritis  . Hx of colonic polyps   . History of diverticulitis of colon   . Headache(784.0)   . Urinary incontinence   . Migraine   . Fibromyalgia   . Miscarriage   . Depression with anxiety 01/19/2009    Qualifier: Diagnosis of  By: Redmond Pulling MD, Frann Rider    . Obesity, unspecified 10/26/2013  . Asthma   . Hyperglycemia 11/23/2014  . Otitis, externa, infective 11/23/2014  . Degenerative disc disease, lumbar 12/13/2015    Past Surgical History  Procedure Laterality Date  . Cholecystectomy    . Abdominal hysterectomy    . Tonsillectomy    . Bladder suspension      Family History  Problem Relation Age of Onset  . Cancer Other     female, brain, lung  . Heart disease Other   . Stroke Other   . Diabetes Other   . Arthritis Other   . Hyperlipidemia Other   . Hypertension Other   . Alzheimer's disease Mother   . Alzheimer's disease Father     Social History   Social History  . Marital Status: Divorced    Spouse Name: N/A  . Number of Children: N/A  . Years of Education: N/A   Occupational History  . unemployed    Social History Main  Topics  . Smoking status: Never Smoker   . Smokeless tobacco: Never Used  . Alcohol Use: Yes  . Drug Use: Not on file  . Sexual Activity: Not on file   Other Topics Concern  . Not on file   Social History Narrative    Outpatient Prescriptions Prior to Visit  Medication Sig Dispense Refill  . amitriptyline (ELAVIL) 50 MG tablet TAKE 1-2 TABLETS (50-100 MG TOTAL) BY MOUTH AT BEDTIME. 60 tablet 1  . clonazePAM (KLONOPIN) 1 MG tablet Take 1 tablet (1 mg total) by mouth 3 (three) times daily. 90 tablet 4  . cyclobenzaprine (FLEXERIL) 10 MG tablet Reported on 12/01/2015    . diclofenac (VOLTAREN) 75 MG EC tablet Take 1 tablet (75 mg total) by mouth 2 (two) times daily. 30 tablet 0  . DULoxetine (CYMBALTA) 60 MG capsule Take 1 capsule (60 mg total) by mouth daily. 90 capsule 1  . gabapentin (NEURONTIN) 300 MG capsule Take 300 mg by mouth at bedtime.     Marland Kitchen levothyroxine (SYNTHROID, LEVOTHROID) 75 MCG tablet TAKE 1 TABLET (75 MCG TOTAL) BY MOUTH DAILY. (Patient taking differently: take 1 tablet 6 days a week.) 30 tablet 6  . NUCYNTA ER 100 MG  TB12 Take 1 tablet by mouth every 12 (twelve) hours.  0  . ranitidine (ZANTAC) 300 MG tablet Take 1 tablet (300 mg total) by mouth at bedtime. 30 tablet 3  . topiramate (TOPAMAX) 25 MG tablet TAKE 1 TABLET (25 MG TOTAL) BY MOUTH 2 (TWO) TIMES DAILY. 60 tablet 2  . fluconazole (DIFLUCAN) 150 MG tablet Take 2 tablets (300 mg total) by mouth once a week. 4 tablet 2  . carisoprodol (SOMA) 350 MG tablet TAKE 1 TABLET BY MOUTH AT BEDTIME AS NEEDED FOR MUSCLE SPASM ALTERNATE WITH FLEXERIL (Patient not taking: Reported on 03/28/2016) 30 tablet 1  . hyoscyamine (LEVSIN SL) 0.125 MG SL tablet Place 1 tablet (0.125 mg total) under the tongue every 4 (four) hours as needed. (Patient not taking: Reported on 03/28/2016) 30 tablet 1  . ondansetron (ZOFRAN) 4 MG tablet Take 1 tablet (4 mg total) by mouth every 8 (eight) hours as needed for nausea. (Patient not taking: Reported  on 03/28/2016) 30 tablet 0  . fluticasone-salmeterol (ADVAIR HFA) 45-21 MCG/ACT inhaler Inhale 2 puffs into the lungs 2 (two) times daily. (Patient not taking: Reported on 03/28/2016) 1 Inhaler 12   No facility-administered medications prior to visit.    Allergies  Allergen Reactions  . Adhesive [Tape]     blisters  . Penicillins     REACTION: rash  . Promethazine Hcl Other (See Comments)    Jerky movements    Review of Systems  Constitutional: Positive for malaise/fatigue. Negative for fever.  HENT: Positive for congestion.   Eyes: Negative for blurred vision.  Respiratory: Positive for cough. Negative for shortness of breath.   Cardiovascular: Negative for chest pain, palpitations and leg swelling.  Gastrointestinal: Negative for nausea, abdominal pain and blood in stool.  Genitourinary: Negative for dysuria and frequency.  Musculoskeletal: Positive for myalgias, back pain and falls.  Skin: Negative for rash.  Neurological: Positive for weakness. Negative for dizziness, loss of consciousness and headaches.  Endo/Heme/Allergies: Negative for environmental allergies.  Psychiatric/Behavioral: Positive for depression. The patient is not nervous/anxious.        Objective:    Physical Exam  Constitutional: She is oriented to person, place, and time. She appears well-developed and well-nourished. No distress.  HENT:  Head: Normocephalic and atraumatic.  Nose: Nose normal.  Eyes: Right eye exhibits no discharge. Left eye exhibits no discharge.  Neck: Normal range of motion. Neck supple.  Cardiovascular: Normal rate and regular rhythm.   No murmur heard. Pulmonary/Chest: Effort normal and breath sounds normal.  Abdominal: Soft. Bowel sounds are normal. There is no tenderness.  Musculoskeletal: She exhibits no edema.  Neurological: She is alert and oriented to person, place, and time.  Skin: Skin is warm and dry.  Psychiatric: She has a normal mood and affect.  Nursing note and  vitals reviewed.   BP 114/86 mmHg  Pulse 106  Temp(Src) 98.7 F (37.1 C) (Oral)  Ht 5' 7"  (1.702 m)  Wt 225 lb (102.059 kg)  BMI 35.23 kg/m2  SpO2 94%  LMP 11/14/1978 Wt Readings from Last 3 Encounters:  03/28/16 225 lb (102.059 kg)  03/10/16 228 lb (103.42 kg)  12/01/15 235 lb (106.595 kg)     Lab Results  Component Value Date   WBC 11.1* 03/28/2016   HGB 15.9* 03/28/2016   HCT 47.9* 03/28/2016   PLT 324.0 03/28/2016   GLUCOSE 91 03/28/2016   CHOL 262* 03/28/2016   TRIG 110.0 03/28/2016   HDL 64.20 03/28/2016   LDLCALC 176* 03/28/2016  ALT 19 03/28/2016   AST 18 03/28/2016   NA 139 03/28/2016   K 4.1 03/28/2016   CL 106 03/28/2016   CREATININE 0.95 03/28/2016   BUN 14 03/28/2016   CO2 22 03/28/2016   TSH 0.16* 03/28/2016   HGBA1C 5.7 11/20/2014    Lab Results  Component Value Date   TSH 0.16* 03/28/2016   Lab Results  Component Value Date   WBC 11.1* 03/28/2016   HGB 15.9* 03/28/2016   HCT 47.9* 03/28/2016   MCV 84.5 03/28/2016   PLT 324.0 03/28/2016   Lab Results  Component Value Date   NA 139 03/28/2016   K 4.1 03/28/2016   CO2 22 03/28/2016   GLUCOSE 91 03/28/2016   BUN 14 03/28/2016   CREATININE 0.95 03/28/2016   BILITOT 0.7 03/28/2016   ALKPHOS 81 03/28/2016   AST 18 03/28/2016   ALT 19 03/28/2016   PROT 7.4 03/28/2016   ALBUMIN 4.3 03/28/2016   CALCIUM 9.6 03/28/2016   GFR 61.84 03/28/2016   Lab Results  Component Value Date   CHOL 262* 03/28/2016   Lab Results  Component Value Date   HDL 64.20 03/28/2016   Lab Results  Component Value Date   LDLCALC 176* 03/28/2016   Lab Results  Component Value Date   TRIG 110.0 03/28/2016   Lab Results  Component Value Date   CHOLHDL 4 03/28/2016   Lab Results  Component Value Date   HGBA1C 5.7 11/20/2014       Assessment & Plan:   Problem List Items Addressed This Visit    Yeast infection - Primary    Diflucan prescribed today, cotton undergarments. Avoid carbohydrates        Relevant Medications   fluconazole (DIFLUCAN) 150 MG tablet   Other Relevant Orders   Ambulatory referral to Gastroenterology   CBC (Completed)   TSH (Completed)   Lipid panel (Completed)   Comprehensive metabolic panel (Completed)   T4, free (Completed)   Sed Rate (ESR) (Completed)   Urinary incontinence   Relevant Medications   fluconazole (DIFLUCAN) 150 MG tablet   Other Relevant Orders   Ambulatory referral to Gastroenterology   CBC (Completed)   TSH (Completed)   Lipid panel (Completed)   Comprehensive metabolic panel (Completed)   T4, free (Completed)   Sed Rate (ESR) (Completed)   Trigeminal neuralgia    Amitriptyline      Obesity    Encouraged DASH diet, decrease po intake and increase exercise as tolerated. Needs 7-8 hours of sleep nightly. Avoid trans fats, eat small, frequent meals every 4-5 hours with lean proteins, complex carbs and healthy fats. Minimize simple carbs.      Relevant Medications   fluconazole (DIFLUCAN) 150 MG tablet   Other Relevant Orders   Ambulatory referral to Gastroenterology   CBC (Completed)   TSH (Completed)   Lipid panel (Completed)   Comprehensive metabolic panel (Completed)   T4, free (Completed)   Sed Rate (ESR) (Completed)   MUSCLE WEAKNESS (GENERALIZED)    Encouraged to accept a course of PT  But declines thus far.      Relevant Medications   fluconazole (DIFLUCAN) 150 MG tablet   Other Relevant Orders   Ambulatory referral to Gastroenterology   CBC (Completed)   TSH (Completed)   Lipid panel (Completed)   Comprehensive metabolic panel (Completed)   T4, free (Completed)   Sed Rate (ESR) (Completed)   Hypothyroidism    Patient continues to request [place] thyroid has been stable on it  will monitor      Relevant Medications   fluconazole (DIFLUCAN) 150 MG tablet   Other Relevant Orders   Ambulatory referral to Gastroenterology   CBC (Completed)   TSH (Completed)   Lipid panel (Completed)   Comprehensive  metabolic panel (Completed)   T4, free (Completed)   Sed Rate (ESR) (Completed)   Hyperlipidemia   Relevant Medications   fluconazole (DIFLUCAN) 150 MG tablet   Other Relevant Orders   Ambulatory referral to Gastroenterology   CBC (Completed)   TSH (Completed)   Lipid panel (Completed)   Comprehensive metabolic panel (Completed)   T4, free (Completed)   Sed Rate (ESR) (Completed)   Hiatal hernia with gastroesophageal reflux    Avoid offending foods, takeprobiotics. Do not eat large meals in late evening and consider raising head of bed.       Hereditary and idiopathic peripheral neuropathy   Relevant Orders   Vitamin B12 (Completed)   Dysphagia    Referred to gastroenterology, avoid offending foods.      Relevant Medications   fluconazole (DIFLUCAN) 150 MG tablet   Other Relevant Orders   Ambulatory referral to Gastroenterology   CBC (Completed)   TSH (Completed)   Lipid panel (Completed)   Comprehensive metabolic panel (Completed)   T4, free (Completed)   Sed Rate (ESR) (Completed)   Anemia   Relevant Medications   fluconazole (DIFLUCAN) 150 MG tablet   Other Relevant Orders   Ambulatory referral to Gastroenterology   CBC (Completed)   TSH (Completed)   Lipid panel (Completed)   Comprehensive metabolic panel (Completed)   T4, free (Completed)   Sed Rate (ESR) (Completed)   Abdominal pain    Improved some       Other Visit Diagnoses    Paresthesia        Relevant Orders    Vitamin B12 (Completed)       I have discontinued Ms. Israelson's fluticasone-salmeterol. I am also having her maintain her cyclobenzaprine, gabapentin, ondansetron, hyoscyamine, ranitidine, NUCYNTA ER, DULoxetine, clonazePAM, carisoprodol, topiramate, amitriptyline, diclofenac, levothyroxine, and fluconazole.  Meds ordered this encounter  Medications  . fluconazole (DIFLUCAN) 150 MG tablet    Sig: Take 2 tablets (300 mg total) by mouth once a week.    Dispense:  4 tablet    Refill:  2       Penni Homans, MD

## 2016-04-03 NOTE — Assessment & Plan Note (Signed)
Diflucan prescribed today, cotton undergarments. Avoid carbohydrates

## 2016-04-03 NOTE — Assessment & Plan Note (Signed)
Encouraged to accept a course of PT  But declines thus far.

## 2016-04-03 NOTE — Assessment & Plan Note (Signed)
Avoid offending foods, take probiotics. Do not eat large meals in late evening and consider raising head of bed.  

## 2016-04-18 ENCOUNTER — Ambulatory Visit: Payer: Self-pay | Admitting: Family Medicine

## 2016-04-19 ENCOUNTER — Encounter: Payer: Self-pay | Admitting: Family Medicine

## 2016-04-19 ENCOUNTER — Ambulatory Visit (INDEPENDENT_AMBULATORY_CARE_PROVIDER_SITE_OTHER): Payer: Medicare Other | Admitting: Family Medicine

## 2016-04-19 VITALS — BP 125/88 | HR 95 | Temp 97.9°F | Ht 67.0 in | Wt 229.4 lb

## 2016-04-19 DIAGNOSIS — M79621 Pain in right upper arm: Secondary | ICD-10-CM

## 2016-04-19 DIAGNOSIS — E039 Hypothyroidism, unspecified: Secondary | ICD-10-CM | POA: Diagnosis not present

## 2016-04-19 DIAGNOSIS — G3184 Mild cognitive impairment, so stated: Secondary | ICD-10-CM

## 2016-04-19 DIAGNOSIS — K219 Gastro-esophageal reflux disease without esophagitis: Secondary | ICD-10-CM | POA: Diagnosis not present

## 2016-04-19 DIAGNOSIS — R739 Hyperglycemia, unspecified: Secondary | ICD-10-CM

## 2016-04-19 DIAGNOSIS — G500 Trigeminal neuralgia: Secondary | ICD-10-CM

## 2016-04-19 DIAGNOSIS — M25521 Pain in right elbow: Secondary | ICD-10-CM

## 2016-04-19 LAB — CBC WITH DIFFERENTIAL/PLATELET
Basophils Absolute: 0.1 10*3/uL (ref 0.0–0.1)
Basophils Relative: 0.5 % (ref 0.0–3.0)
Eosinophils Absolute: 0.4 10*3/uL (ref 0.0–0.7)
Eosinophils Relative: 3.1 % (ref 0.0–5.0)
HCT: 43.8 % (ref 36.0–46.0)
Hemoglobin: 14.4 g/dL (ref 12.0–15.0)
Lymphocytes Relative: 38.1 % (ref 12.0–46.0)
Lymphs Abs: 4.3 10*3/uL — ABNORMAL HIGH (ref 0.7–4.0)
MCHC: 32.8 g/dL (ref 30.0–36.0)
MCV: 84.9 fl (ref 78.0–100.0)
Monocytes Absolute: 0.7 10*3/uL (ref 0.1–1.0)
Monocytes Relative: 6 % (ref 3.0–12.0)
Neutro Abs: 6 10*3/uL (ref 1.4–7.7)
Neutrophils Relative %: 52.3 % (ref 43.0–77.0)
Platelets: 329 10*3/uL (ref 150.0–400.0)
RBC: 5.17 Mil/uL — ABNORMAL HIGH (ref 3.87–5.11)
RDW: 13.9 % (ref 11.5–15.5)
WBC: 11.4 10*3/uL — ABNORMAL HIGH (ref 4.0–10.5)

## 2016-04-19 MED ORDER — AMITRIPTYLINE HCL 50 MG PO TABS: ORAL_TABLET | ORAL | Status: DC

## 2016-04-19 NOTE — Patient Instructions (Signed)

## 2016-04-19 NOTE — Progress Notes (Signed)
Pre visit review using our clinic review tool, if applicable. No additional management support is needed unless otherwise documented below in the visit note. 

## 2016-04-25 ENCOUNTER — Telehealth: Payer: Self-pay | Admitting: Family Medicine

## 2016-04-25 ENCOUNTER — Other Ambulatory Visit: Payer: Self-pay | Admitting: Family Medicine

## 2016-04-25 DIAGNOSIS — G3184 Mild cognitive impairment, so stated: Secondary | ICD-10-CM

## 2016-04-25 DIAGNOSIS — G500 Trigeminal neuralgia: Secondary | ICD-10-CM

## 2016-04-25 NOTE — Telephone Encounter (Signed)
°  Relationship to patient: Self Can be reached: [phone]   Reason for call: Called to follow up on referral to see Neurologist. States she and Dr. Charlett Blake had discussed it and she thought a referral was placed.

## 2016-04-28 DIAGNOSIS — M1710 Unilateral primary osteoarthritis, unspecified knee: Secondary | ICD-10-CM | POA: Diagnosis not present

## 2016-04-28 DIAGNOSIS — M6250 Muscle wasting and atrophy, not elsewhere classified, unspecified site: Secondary | ICD-10-CM | POA: Diagnosis not present

## 2016-04-28 DIAGNOSIS — Z6838 Body mass index (BMI) 38.0-38.9, adult: Secondary | ICD-10-CM | POA: Diagnosis not present

## 2016-04-28 DIAGNOSIS — G894 Chronic pain syndrome: Secondary | ICD-10-CM | POA: Diagnosis not present

## 2016-04-28 DIAGNOSIS — M4696 Unspecified inflammatory spondylopathy, lumbar region: Secondary | ICD-10-CM | POA: Diagnosis not present

## 2016-04-29 ENCOUNTER — Encounter: Payer: Self-pay | Admitting: Family Medicine

## 2016-04-29 DIAGNOSIS — G3184 Mild cognitive impairment, so stated: Secondary | ICD-10-CM

## 2016-04-29 HISTORY — DX: Mild cognitive impairment of uncertain or unknown etiology: G31.84

## 2016-04-29 NOTE — Progress Notes (Signed)
Patient ID: Kayla [name], female   DOB: [birth year], 70 y.o.   MRN: [id number]   Subjective:    Patient ID: Kayla [name], female    DOB: 23-Sep-1946, 70 y.o.   MRN: [id number]  Chief Complaint  Patient presents with  . Depression  . Arm Pain    HPI Patient is in today for follow up with numerous concerns. She struggles with anxiety, depression and insomnia and is now complaining of some mild memory loss. No difficulty with ADLs thus far. Notes HA and facial pain as well. Denies CP/palp/SOB/HA/congestion/fevers/GI or GU c/o. Taking meds as prescribed. Denies suicidal ideation  Past Medical History  Diagnosis Date  . Allergy     allergic rhinitis  . Anemia     NOS  . Depression   . GERD (gastroesophageal reflux disease)   . Hyperlipidemia   . Thyroid disease     hypothyroidism  . Arthritis     osteoarthritis  . Hx of colonic polyps   . History of diverticulitis of colon   . Headache(784.0)   . Urinary incontinence   . Migraine   . Fibromyalgia   . Miscarriage   . Depression with anxiety 01/19/2009    Qualifier: Diagnosis of  By: Redmond Pulling MD, Frann Rider    . Obesity, unspecified 10/26/2013  . Asthma   . Hyperglycemia 11/23/2014  . Otitis, externa, infective 11/23/2014  . Degenerative disc disease, lumbar 12/13/2015  . Mild cognitive impairment 04/29/2016    Past Surgical History  Procedure Laterality Date  . Cholecystectomy    . Abdominal hysterectomy    . Tonsillectomy    . Bladder suspension      Family History  Problem Relation Age of Onset  . Cancer Other     female, brain, lung  . Heart disease Other   . Stroke Other   . Diabetes Other   . Arthritis Other   . Hyperlipidemia Other   . Hypertension Other   . Alzheimer's disease Mother   . Alzheimer's disease Father     Social History   Social History  . Marital Status: Divorced    Spouse Name: N/A  . Number of Children: N/A  . Years of Education: N/A   Occupational History  . unemployed    Social History  Main Topics  . Smoking status: Never Smoker   . Smokeless tobacco: Never Used  . Alcohol Use: Yes  . Drug Use: Not on file  . Sexual Activity: Not on file   Other Topics Concern  . Not on file   Social History Narrative    Outpatient Prescriptions Prior to Visit  Medication Sig Dispense Refill  . carisoprodol (SOMA) 350 MG tablet TAKE 1 TABLET BY MOUTH AT BEDTIME AS NEEDED FOR MUSCLE SPASM ALTERNATE WITH FLEXERIL 30 tablet 1  . clonazePAM (KLONOPIN) 1 MG tablet Take 1 tablet (1 mg total) by mouth 3 (three) times daily. 90 tablet 4  . cyclobenzaprine (FLEXERIL) 10 MG tablet Reported on 12/01/2015    . diclofenac (VOLTAREN) 75 MG EC tablet Take 1 tablet (75 mg total) by mouth 2 (two) times daily. 30 tablet 0  . DULoxetine (CYMBALTA) 60 MG capsule Take 1 capsule (60 mg total) by mouth daily. 90 capsule 1  . gabapentin (NEURONTIN) 300 MG capsule Take 300 mg by mouth at bedtime.     . hyoscyamine (LEVSIN SL) 0.125 MG SL tablet Place 1 tablet (0.125 mg total) under the tongue every 4 (four) hours as needed. 30 tablet 1  .  levothyroxine (SYNTHROID, LEVOTHROID) 75 MCG tablet TAKE 1 TABLET (75 MCG TOTAL) BY MOUTH DAILY. (Patient taking differently: take 1 tablet 6 days a week.) 30 tablet 6  . NUCYNTA ER 100 MG TB12 Take 1 tablet by mouth every 12 (twelve) hours.  0  . ondansetron (ZOFRAN) 4 MG tablet Take 1 tablet (4 mg total) by mouth every 8 (eight) hours as needed for nausea. 30 tablet 0  . ranitidine (ZANTAC) 300 MG tablet Take 1 tablet (300 mg total) by mouth at bedtime. 30 tablet 3  . topiramate (TOPAMAX) 25 MG tablet TAKE 1 TABLET (25 MG TOTAL) BY MOUTH 2 (TWO) TIMES DAILY. 60 tablet 2  . amitriptyline (ELAVIL) 50 MG tablet TAKE 1-2 TABLETS (50-100 MG TOTAL) BY MOUTH AT BEDTIME. 60 tablet 1  . fluconazole (DIFLUCAN) 150 MG tablet Take 2 tablets (300 mg total) by mouth once a week. 4 tablet 2   No facility-administered medications prior to visit.    Allergies  Allergen Reactions  .  Adhesive [Tape]     blisters  . Penicillins     REACTION: rash  . Promethazine Hcl Other (See Comments)    Jerky movements    Review of Systems  Constitutional: Positive for malaise/fatigue. Negative for fever.  HENT: Negative for congestion.   Eyes: Negative for blurred vision.  Respiratory: Negative for shortness of breath.   Cardiovascular: Negative for chest pain, palpitations and leg swelling.  Gastrointestinal: Negative for nausea, abdominal pain and blood in stool.  Genitourinary: Negative for dysuria and frequency.  Musculoskeletal: Positive for myalgias, back pain, joint pain and neck pain. Negative for falls.  Skin: Negative for rash.  Neurological: Positive for tingling. Negative for dizziness, loss of consciousness and headaches.  Endo/Heme/Allergies: Negative for environmental allergies.  Psychiatric/Behavioral: Positive for depression and memory loss. The patient is nervous/anxious and has insomnia.        Objective:    Physical Exam  Constitutional: She is oriented to person, place, and time. She appears well-developed and well-nourished. No distress.  HENT:  Head: Normocephalic and atraumatic.  Nose: Nose normal.  Eyes: Right eye exhibits no discharge. Left eye exhibits no discharge.  Neck: Normal range of motion. Neck supple.  Cardiovascular: Normal rate and regular rhythm.   Pulmonary/Chest: Effort normal and breath sounds normal.  Abdominal: Soft. Bowel sounds are normal. There is no tenderness.  Musculoskeletal: She exhibits no edema.  Neurological: She is alert and oriented to person, place, and time.  Skin: Skin is warm and dry.  Psychiatric: She has a normal mood and affect.  Nursing note and vitals reviewed.   BP 125/88 mmHg  Pulse 95  Temp(Src) 97.9 F (36.6 C) (Oral)  Ht 5\' 7"  (1.702 m)  Wt 229 lb 6 oz (104.044 kg)  BMI 35.92 kg/m2  SpO2 96%  LMP 11/14/1978 Wt Readings from Last 3 Encounters:  04/19/16 229 lb 6 oz (104.044 kg)  03/28/16  225 lb (102.059 kg)  03/10/16 228 lb (103.42 kg)     Lab Results  Component Value Date   WBC 11.4* 04/19/2016   HGB 14.4 04/19/2016   HCT 43.8 04/19/2016   PLT 329.0 04/19/2016   GLUCOSE 91 03/28/2016   CHOL 262* 03/28/2016   TRIG 110.0 03/28/2016   HDL 64.20 03/28/2016   LDLCALC 176* 03/28/2016   ALT 19 03/28/2016   AST 18 03/28/2016   NA 139 03/28/2016   K 4.1 03/28/2016   CL 106 03/28/2016   CREATININE 0.95 03/28/2016   BUN 14  03/28/2016   CO2 22 03/28/2016   TSH 0.16* 03/28/2016   HGBA1C 5.7 11/20/2014    Lab Results  Component Value Date   TSH 0.16* 03/28/2016   Lab Results  Component Value Date   WBC 11.4* 04/19/2016   HGB 14.4 04/19/2016   HCT 43.8 04/19/2016   MCV 84.9 04/19/2016   PLT 329.0 04/19/2016   Lab Results  Component Value Date   NA 139 03/28/2016   K 4.1 03/28/2016   CO2 22 03/28/2016   GLUCOSE 91 03/28/2016   BUN 14 03/28/2016   CREATININE 0.95 03/28/2016   BILITOT 0.7 03/28/2016   ALKPHOS 81 03/28/2016   AST 18 03/28/2016   ALT 19 03/28/2016   PROT 7.4 03/28/2016   ALBUMIN 4.3 03/28/2016   CALCIUM 9.6 03/28/2016   GFR 61.84 03/28/2016   Lab Results  Component Value Date   CHOL 262* 03/28/2016   Lab Results  Component Value Date   HDL 64.20 03/28/2016   Lab Results  Component Value Date   LDLCALC 176* 03/28/2016   Lab Results  Component Value Date   TRIG 110.0 03/28/2016   Lab Results  Component Value Date   CHOLHDL 4 03/28/2016   Lab Results  Component Value Date   HGBA1C 5.7 11/20/2014       Assessment & Plan:   Problem List Items Addressed This Visit    Trigeminal neuralgia    Worsening pain will refer to neurology for consideration      Relevant Medications   amitriptyline (ELAVIL) 50 MG tablet   Mild cognitive impairment    Patient struggles with anxiety and depression but also thinks her memory is worsening. Will refer to neurology for further consideration      Hypothyroidism    On  Levothyroxine, continue to monitor      Hyperglycemia     minimize simple carbs. Increase exercise as tolerated.       GERD    Avoid offending foods, take probiotics. Do not eat large meals in late evening and consider raising head of bed       Other Visit Diagnoses    Pain in joint, upper arm, right    -  Primary    Relevant Orders    CBC w/Diff (Completed)       I have discontinued Ms. Schuyler's fluconazole. I am also having her maintain her cyclobenzaprine, gabapentin, ondansetron, hyoscyamine, ranitidine, NUCYNTA ER, DULoxetine, clonazePAM, carisoprodol, topiramate, diclofenac, levothyroxine, and amitriptyline.  Meds ordered this encounter  Medications  . amitriptyline (ELAVIL) 50 MG tablet    Sig: TAKE 1-2 TABLETS (50-100 MG TOTAL) BY MOUTH AT BEDTIME.    Dispense:  60 tablet    Refill:  3     Penni Homans, MD

## 2016-04-29 NOTE — Assessment & Plan Note (Signed)
Worsening pain will refer to neurology for consideration

## 2016-04-29 NOTE — Assessment & Plan Note (Signed)
Patient struggles with anxiety and depression but also thinks her memory is worsening. Will refer to neurology for further consideration

## 2016-04-29 NOTE — Assessment & Plan Note (Signed)
On Levothyroxine, continue to monitor 

## 2016-04-29 NOTE — Assessment & Plan Note (Signed)
Avoid offending foods, take probiotics. Do not eat large meals in late evening and consider raising head of bed.  

## 2016-04-29 NOTE — Assessment & Plan Note (Signed)
minimize simple carbs. Increase exercise as tolerated.  

## 2016-05-13 ENCOUNTER — Other Ambulatory Visit: Payer: Self-pay

## 2016-05-13 MED ORDER — DICLOFENAC SODIUM 75 MG PO TBEC: 75.0000 mg | DELAYED_RELEASE_TABLET | Freq: Two times a day (BID) | ORAL | Status: DC

## 2016-05-13 NOTE — Telephone Encounter (Signed)
Rx refilled 05/13/16.

## 2016-05-16 ENCOUNTER — Ambulatory Visit: Payer: Self-pay | Admitting: Neurology

## 2016-05-16 ENCOUNTER — Telehealth: Payer: Self-pay | Admitting: *Deleted

## 2016-05-16 NOTE — Telephone Encounter (Signed)
Called to cancel morning of appointment - sick.

## 2016-05-21 DIAGNOSIS — M25511 Pain in right shoulder: Secondary | ICD-10-CM | POA: Diagnosis not present

## 2016-05-21 DIAGNOSIS — M542 Cervicalgia: Secondary | ICD-10-CM | POA: Diagnosis not present

## 2016-05-21 DIAGNOSIS — M797 Fibromyalgia: Secondary | ICD-10-CM | POA: Diagnosis not present

## 2016-05-24 ENCOUNTER — Other Ambulatory Visit: Payer: Self-pay | Admitting: Family Medicine

## 2016-06-19 ENCOUNTER — Other Ambulatory Visit: Payer: Self-pay | Admitting: Family Medicine

## 2016-07-09 DIAGNOSIS — M797 Fibromyalgia: Secondary | ICD-10-CM | POA: Diagnosis not present

## 2016-07-09 DIAGNOSIS — S0990XA Unspecified injury of head, initial encounter: Secondary | ICD-10-CM | POA: Diagnosis not present

## 2016-07-09 DIAGNOSIS — R51 Headache: Secondary | ICD-10-CM | POA: Diagnosis not present

## 2016-07-09 DIAGNOSIS — N390 Urinary tract infection, site not specified: Secondary | ICD-10-CM | POA: Diagnosis not present

## 2016-07-20 ENCOUNTER — Telehealth: Payer: Self-pay | Admitting: *Deleted

## 2016-07-20 ENCOUNTER — Ambulatory Visit: Payer: Self-pay | Admitting: Neurology

## 2016-07-20 NOTE — Telephone Encounter (Signed)
No showed new patient appointment. 

## 2016-07-21 ENCOUNTER — Encounter: Payer: Self-pay | Admitting: Neurology

## 2016-07-28 ENCOUNTER — Encounter: Payer: Self-pay | Admitting: Family Medicine

## 2016-07-28 ENCOUNTER — Ambulatory Visit (INDEPENDENT_AMBULATORY_CARE_PROVIDER_SITE_OTHER): Payer: Medicare Other | Admitting: Family Medicine

## 2016-07-28 VITALS — BP 113/75 | HR 76 | Temp 98.9°F | Ht 67.0 in | Wt 230.2 lb

## 2016-07-28 DIAGNOSIS — R296 Repeated falls: Secondary | ICD-10-CM

## 2016-07-28 DIAGNOSIS — N390 Urinary tract infection, site not specified: Secondary | ICD-10-CM

## 2016-07-28 LAB — POC URINALSYSI DIPSTICK (AUTOMATED)
Bilirubin, UA: NEGATIVE
Blood, UA: NEGATIVE
Glucose, UA: NEGATIVE
Ketones, UA: NEGATIVE
Nitrite, UA: NEGATIVE
PH UA: 6
PROTEIN UA: NEGATIVE
UROBILINOGEN UA: NEGATIVE

## 2016-07-28 NOTE — Progress Notes (Addendum)
[place] [facility] [address] [place], [place] [address] [phone] [id number] [phone]  Date:  07/28/2016   Name:  Kayla [name]   DOB:  July 20, 1946   MRN:  [id number]  PCP:  Penni Homans, MD    Chief Complaint: Urinary Tract Infection (c/o increased urgency and slight burning. pt states that she had a fall 2 weeks ago, went to the doctor and was also dx with a UTI was given meds for the UTI but stopped after 4-5 days due to her daughter stated that the medication was not good for someone her age. )   History of Present Illness:  Kayla [name] is a 70 y.o. very pleasant female patient who presents with the following:  Here today with complaint of possible UTI.  She was dx with a UTI and was taking what sounds like cipro.    She also related that a couple of months ago she started having frequent falls- this started all of a sudden.  She has discussed this with Dr Charlett Blake and was referred to neurology- she actually had an appt last week but states she was not aware of this and was a no- show.   We will need to trouble shoot this for her. She has not fallen in a week, but states that she sometimes will fall and not be able to get up, she may have to stay on the floor for a long time until someone finds her  She was seen in a local ER on 8/27 after a fall and was dx with a UTI; I do not see a culture in care everywhere. However her UA was very dirty, other labs were ok.  She did take the prescribed abx for 4-5 days but is not sure how many doses she really took as she often forgot to take it  She notes "a little bit" of recurrent urinary sx- she will feel like she needs to use the bathroom but then when she goes there is not much there.   She is not sure if she got better and then sx returned or not. She does not have any dysuria. No hematuria  No fever, chills, or vomiting.  She is not having abd pain.   She was rx keflex by the ER- she has a history of  mild rash with penicillin. Was tolerating the keflex just fine. However, her daughter thought that she should not be taking this due to risk of tendon and muscle injury in older persons - it seems like she thought it was ?cipro.  Based on her daughter's advice she stopped taking the keflex and threw it away.  States that her daughter told her that she cannot have a flu shot.  "If I don't do what she says I can't see my grand-kids."   BP Readings from Last 3 Encounters:  07/28/16 113/75  04/19/16 125/88  03/28/16 114/86     Patient Active Problem List   Diagnosis Date Noted  . Mild cognitive impairment 04/29/2016  . Yeast infection 04/03/2016  . Dysphagia 04/03/2016  . Hereditary and idiopathic peripheral neuropathy 04/03/2016  . Degenerative disc disease, lumbar 12/13/2015  . Right axillary swelling 06/15/2015  . Breast cancer screening 06/15/2015  . Lesion of ear canal 02/22/2015  . Knee pain, bilateral 02/12/2015  . Headache 02/12/2015  . Insomnia 02/12/2015  . Hyperglycemia 11/23/2014  . Otitis, externa, infective 11/23/2014  . Trigeminal neuralgia 08/03/2014  .  Hiatal hernia with gastroesophageal reflux 03/09/2014  . Abdominal pain 02/08/2014  . Obesity 10/26/2013  . Bladder pain 05/04/2012  . Asthma 05/04/2012  . Musculoskeletal pain 02/01/2012  . Fecal incontinence 12/13/2011  . Vaginal itching 12/13/2011  . Hip pain, right 06/10/2011  . POSTMENOPAUSAL STATUS 09/21/2010  . MUSCLE WEAKNESS (GENERALIZED) 08/31/2010  . SOMNOLENCE 03/09/2010  . Hypothyroidism 01/19/2009  . Hyperlipidemia 01/19/2009  . Anemia 01/19/2009  . Depression with anxiety 01/19/2009  . Chronic pain syndrome 01/19/2009  . COMMON MIGRAINE 01/19/2009  . Allergic rhinitis 01/19/2009  . GERD 01/19/2009  . Osteoarthritis 01/19/2009  . FIBROMYALGIA 01/19/2009  . Fatigue due to depression 01/19/2009  . Urinary incontinence 01/19/2009  . COLONIC POLYPS, HX OF 01/19/2009  . DIVERTICULITIS, HX OF  01/19/2009    Past Medical History:  Diagnosis Date  . Allergy    allergic rhinitis  . Anemia    NOS  . Arthritis    osteoarthritis  . Asthma   . Degenerative disc disease, lumbar 12/13/2015  . Depression   . Depression with anxiety 01/19/2009   Qualifier: Diagnosis of  By: Redmond Pulling MD, Frann Rider    . Fibromyalgia   . GERD (gastroesophageal reflux disease)   . Headache(784.0)   . History of diverticulitis of colon   . Hx of colonic polyps   . Hyperglycemia 11/23/2014  . Hyperlipidemia   . Migraine   . Mild cognitive impairment 04/29/2016  . Miscarriage   . Obesity, unspecified 10/26/2013  . Otitis, externa, infective 11/23/2014  . Thyroid disease    hypothyroidism  . Urinary incontinence     Past Surgical History:  Procedure Laterality Date  . ABDOMINAL HYSTERECTOMY    . BLADDER SUSPENSION    . CHOLECYSTECTOMY    . TONSILLECTOMY      Social History  Substance Use Topics  . Smoking status: Never Smoker  . Smokeless tobacco: Never Used  . Alcohol use Yes    Family History  Problem Relation Age of Onset  . Cancer Other     female, brain, lung  . Heart disease Other   . Stroke Other   . Diabetes Other   . Arthritis Other   . Hyperlipidemia Other   . Hypertension Other   . Alzheimer's disease Mother   . Alzheimer's disease Father     Allergies  Allergen Reactions  . Adhesive [Tape]     blisters  . Penicillins     REACTION: rash  . Promethazine Hcl Other (See Comments)    Jerky movements    Medication list has been reviewed and updated.  Current Outpatient Prescriptions on File Prior to Visit  Medication Sig Dispense Refill  . amitriptyline (ELAVIL) 50 MG tablet TAKE 1-2 TABLETS (50-100 MG TOTAL) BY MOUTH AT BEDTIME. 60 tablet 3  . carisoprodol (SOMA) 350 MG tablet TAKE 1 TABLET BY MOUTH AT BEDTIME AS NEEDED FOR MUSCLE SPASM ALTERNATE WITH FLEXERIL 30 tablet 1  . clonazePAM (KLONOPIN) 1 MG tablet Take 1 tablet (1 mg total) by mouth 3 (three) times daily.  90 tablet 4  . cyclobenzaprine (FLEXERIL) 10 MG tablet Reported on 12/01/2015    . diclofenac (VOLTAREN) 75 MG EC tablet Take 1 tablet (75 mg total) by mouth 2 (two) times daily. 60 tablet 1  . DULoxetine (CYMBALTA) 60 MG capsule TAKE 1 CAPSULE (60 MG TOTAL) BY MOUTH DAILY. 90 capsule 1  . gabapentin (NEURONTIN) 300 MG capsule Take 300 mg by mouth at bedtime.     . hyoscyamine (LEVSIN SL)  0.125 MG SL tablet Place 1 tablet (0.125 mg total) under the tongue every 4 (four) hours as needed. 30 tablet 1  . levothyroxine (SYNTHROID, LEVOTHROID) 75 MCG tablet TAKE 1 TABLET (75 MCG TOTAL) BY MOUTH DAILY. (Patient taking differently: take 1 tablet 6 days a week.) 30 tablet 6  . NUCYNTA ER 100 MG TB12 Take 1 tablet by mouth every 12 (twelve) hours.  0  . ondansetron (ZOFRAN) 4 MG tablet Take 1 tablet (4 mg total) by mouth every 8 (eight) hours as needed for nausea. 30 tablet 0  . ranitidine (ZANTAC) 300 MG tablet Take 1 tablet (300 mg total) by mouth at bedtime. 30 tablet 3  . topiramate (TOPAMAX) 25 MG tablet TAKE 1 TABLET (25 MG TOTAL) BY MOUTH 2 (TWO) TIMES DAILY. 60 tablet 2   No current facility-administered medications on file prior to visit.     Review of Systems:  As per HPI- otherwise negative.   Physical Examination: Vitals:   07/28/16 1710  BP: 113/75  Pulse: 76  Temp: 98.9 F (37.2 C)   Vitals:   07/28/16 1710  Weight: 230 lb 3.2 oz (104.4 kg)  Height: 5\' 7"  (1.702 m)   Body mass index is 36.05 kg/m. Ideal Body Weight: Weight in (lb) to have BMI = 25: 159.3  GEN: WDWN, NAD, Non-toxic, A & O x 3, obese, looks well HEENT: Atraumatic, Normocephalic. Neck supple. No masses, No LAD. Ears and Nose: No external deformity. CV: RRR, No M/G/R. No JVD. No thrill. No extra heart sounds. PULM: CTA B, no wheezes, crackles, rhonchi. No retractions. No resp. distress. No accessory muscle use. ABD: S, NT, ND EXTR: No c/c/e NEURO Normal gait.  PSYCH: Normally interactive. Conversant. Not  depressed or anxious appearing.  Calm demeanor.   Results for orders placed or performed in visit on 07/28/16  POCT Urinalysis Dipstick (Automated)  Result Value Ref Range   Color, UA yellow    Clarity, UA clear    Glucose, UA negative    Bilirubin, UA negative    Ketones, UA negative    Spec Grav, UA >=1.030    Blood, UA negative    pH, UA 6.0    Protein, UA negative    Urobilinogen, UA negative    Nitrite, UA negative    Leukocytes, UA Trace (A) Negative    Assessment and Plan: Urinary tract infection, site not specified - Plan: POCT Urinalysis Dipstick (Automated), Urine Culture  Frequent falls  Partially treated UTI Will re-culture her urine as her dip is fairly benign. Counseled pt that we always welcome questions and concerns about medication safety. However would recommend that she ask her doctor instead of just stopping needed medications if her daughter has concerns.  Sent message to RN at neurology to try and get her re-scheduled for a new pat appt for falls Also recommended a life alert type system for her as she reports having to lie on the floor for a long time prior to getting help after some of her falls   Signed Lamar Blinks, MD  addnd- received her urine culture on 9/18- negative for any current UTI. Gave her a call and let her know

## 2016-07-28 NOTE — Patient Instructions (Signed)
We will culture your urine to see if you have any current infection.  If you do, keflex (the med you were taking last time) would be a good option for you. It does not cause tendon or muscle problems (I think you daughter thought you were on a different medication) We need to have you see neurology about your falls- it looks like you had an appt last week that you were not aware of.  I will call them and try to get your re-scheduled asap.   Let us know if you have any change or worsening of your symptoms in the meantime!

## 2016-07-31 LAB — URINE CULTURE: Organism ID, Bacteria: NO GROWTH

## 2016-08-30 ENCOUNTER — Other Ambulatory Visit: Payer: Self-pay | Admitting: Family Medicine

## 2016-09-02 ENCOUNTER — Telehealth: Payer: Self-pay | Admitting: Family Medicine

## 2016-09-02 ENCOUNTER — Other Ambulatory Visit: Payer: Self-pay | Admitting: Family Medicine

## 2016-09-02 NOTE — Telephone Encounter (Signed)
perfect

## 2016-09-02 NOTE — Telephone Encounter (Signed)
Clonazepam Rx printed remotely; Rx phoned to pharmacy/[facility] 10/20

## 2016-09-14 ENCOUNTER — Other Ambulatory Visit: Payer: Self-pay | Admitting: Family Medicine

## 2016-09-14 DIAGNOSIS — B379 Candidiasis, unspecified: Secondary | ICD-10-CM

## 2016-09-20 ENCOUNTER — Telehealth: Payer: Self-pay | Admitting: Family Medicine

## 2016-09-20 ENCOUNTER — Telehealth: Payer: Self-pay | Admitting: Behavioral Health

## 2016-09-20 NOTE — Telephone Encounter (Signed)
[employer] note received via fax  Call Date: 09/20/16 Time: 2:25 AM   Caller: Cory Roughen (Self) Return number: [phone]  Chief Complaint: Caller states she got a prescription refilled 3-4 days ago. It is an antidepressant medication. She's having trouble sleeping at night.  Attempted to reach patient regarding the above concern. Left message for patient to return call when available.

## 2016-09-20 NOTE — Telephone Encounter (Signed)
Relation to [id number] Call back [phone] Pharmacy: [id number] - [place], [place] - [address] AT [address]OSS [address] [phone] (Phone) [phone] (Fax)     Reason for call:  Patient requesting a refill, patient states she lost the bottle, please advise

## 2016-09-21 NOTE — Telephone Encounter (Signed)
Please check with patient about what happened. Seems to me she should have refills left.

## 2016-09-22 NOTE — Telephone Encounter (Signed)
Called left message to call back 

## 2016-09-22 NOTE — Telephone Encounter (Signed)
Patient returning your call.

## 2016-09-22 NOTE — Telephone Encounter (Signed)
Called left message to call back.  She has 3 refills left.

## 2016-09-22 NOTE — Telephone Encounter (Signed)
Patient called back and she has called her pharmacy already and they have given her a refill on her medication.

## 2016-09-28 ENCOUNTER — Telehealth: Payer: Self-pay | Admitting: Family Medicine

## 2016-09-28 NOTE — Telephone Encounter (Signed)
Called Kayla [name] to schedule awv with health coach. Left msg for pt to call office and schedule appt.

## 2016-10-25 ENCOUNTER — Other Ambulatory Visit: Payer: Self-pay | Admitting: Family Medicine

## 2016-10-31 ENCOUNTER — Telehealth: Payer: Self-pay | Admitting: Behavioral Health

## 2016-10-31 NOTE — Telephone Encounter (Signed)
Unable to reach patient at time of Pre-Visit Call.  Left message for patient to return call when available.    

## 2016-11-01 ENCOUNTER — Encounter: Payer: Self-pay | Admitting: Family Medicine

## 2016-11-01 ENCOUNTER — Ambulatory Visit (INDEPENDENT_AMBULATORY_CARE_PROVIDER_SITE_OTHER): Payer: Medicare Other | Admitting: Family Medicine

## 2016-11-01 VITALS — BP 126/76 | HR 78 | Temp 98.8°F | Ht 67.0 in | Wt 224.4 lb

## 2016-11-01 DIAGNOSIS — D729 Disorder of white blood cells, unspecified: Secondary | ICD-10-CM | POA: Diagnosis not present

## 2016-11-01 DIAGNOSIS — M19011 Primary osteoarthritis, right shoulder: Secondary | ICD-10-CM

## 2016-11-01 DIAGNOSIS — E6609 Other obesity due to excess calories: Secondary | ICD-10-CM

## 2016-11-01 DIAGNOSIS — E782 Mixed hyperlipidemia: Secondary | ICD-10-CM | POA: Diagnosis not present

## 2016-11-01 DIAGNOSIS — G3184 Mild cognitive impairment, so stated: Secondary | ICD-10-CM

## 2016-11-01 DIAGNOSIS — F418 Other specified anxiety disorders: Secondary | ICD-10-CM

## 2016-11-01 DIAGNOSIS — Z0000 Encounter for general adult medical examination without abnormal findings: Secondary | ICD-10-CM | POA: Diagnosis not present

## 2016-11-01 DIAGNOSIS — L659 Nonscarring hair loss, unspecified: Secondary | ICD-10-CM

## 2016-11-01 DIAGNOSIS — E039 Hypothyroidism, unspecified: Secondary | ICD-10-CM | POA: Diagnosis not present

## 2016-11-01 DIAGNOSIS — R739 Hyperglycemia, unspecified: Secondary | ICD-10-CM | POA: Diagnosis not present

## 2016-11-01 HISTORY — DX: Nonscarring hair loss, unspecified: L65.9

## 2016-11-01 HISTORY — DX: Encounter for general adult medical examination without abnormal findings: Z00.00

## 2016-11-01 MED ORDER — AMITRIPTYLINE HCL 50 MG PO TABS: ORAL_TABLET | ORAL | 3 refills | Status: DC

## 2016-11-01 MED ORDER — ATORVASTATIN CALCIUM 10 MG PO TABS: ORAL_TABLET | ORAL | 1 refills | Status: DC

## 2016-11-01 NOTE — Assessment & Plan Note (Addendum)
Ran MMSE today

## 2016-11-01 NOTE — Assessment & Plan Note (Signed)
Right shoulder ongoing and variable over past few months. Xray showed moderate arthritis, declines referral to sports med.

## 2016-11-01 NOTE — Progress Notes (Signed)
Pre visit review using our clinic review tool, if applicable. No additional management support is needed unless otherwise documented below in the visit note. 

## 2016-11-01 NOTE — Assessment & Plan Note (Signed)
hgba1c acceptable, minimize simple carbs. Increase exercise as tolerated.  

## 2016-11-01 NOTE — Assessment & Plan Note (Signed)
Worse over past month, start zinc ena fatty acids, check labs with TSH and refer to derm if persists

## 2016-11-01 NOTE — Assessment & Plan Note (Signed)
Patient denies any difficulties at home. No trouble with ADLs, depression or falls. See EMR for functional status screen and depression screen. No recent changes to vision or hearing. Is UTD with immunizations. Is UTD with screening. Discussed Advanced Directives. Encouraged heart healthy diet, exercise as tolerated and adequate sleep. See patient's problem list for health risk factors to monitor. See AVS for preventative healthcare recommendation schedule. Given and reviewed copy of ACP documents from [place] Secretary of State and encouraged to complete and return 

## 2016-11-01 NOTE — Assessment & Plan Note (Signed)
Encouraged heart healthy diet, increase exercise, avoid trans fats, consider a krill oil cap daily 

## 2016-11-01 NOTE — Patient Instructions (Addendum)
Try Zinc, Fish, or Krill oil for hair loss. Melatonin 20mg  for sleep  4 oz warm prune juice with 2 tablespoons of Milk of magnesium if no results in a few hours add Dulcolax suppository per the rectum, and Benefiber 2 times a day     About Constipation  Constipation Overview Constipation is the most common gastrointestinal complaint - about 4 million Americans experience constipation and make 2.5 million physician visits a year to get help for the problem.  Constipation can occur when the colon absorbs too much water, the colon's muscle contraction is slow or sluggish, and/or there is delayed transit time through the colon.  The result is stool that is hard and dry.  Indicators of constipation include straining during bowel movements greater than 25% of the time, having fewer than three bowel movements per week, and/or the feeling of incomplete evacuation.  There are established guidelines ([place] II ) for defining constipation. A person needs to have two or more of the following symptoms for at least 12 weeks (not necessarily consecutive) in the preceding 12 months: . Straining in  greater than 25% of bowel movements . Lumpy or hard stools in greater than 25% of bowel movements . Sensation of incomplete emptying in greater than 25% of bowel movements . Sensation of anorectal obstruction/blockade in greater than 25% of bowel movements . Manual maneuvers to help empty greater than 25% of bowel movements (e.g., digital evacuation, support of the pelvic floor)  . Less than  3 bowel movements/week . Loose stools are not present, and criteria for irritable bowel syndrome are insufficient  Common Causes of Constipation . Lack of fiber in your diet . Lack of physical activity . Medications, including iron and calcium supplements  . Dairy intake . Dehydration . Abuse of laxatives  Travel  Irritable Bowel Syndrome  Pregnancy  Luteal phase of menstruation (after ovulation and before  menses)  Colorectal problems  Intestinal Dysfunction  Treating Constipation  There are several ways of treating constipation, including changes to diet and exercise, use of laxatives, adjustments to the pelvic floor, and scheduled toileting.  These treatments include: . increasing fiber and fluids in the diet  . increasing physical activity . learning muscle coordination   learning proper toileting techniques and toileting modifications   designing and sticking  to a toileting schedule     2007, [employer] Doc.22 Shoulder Pain Many things can cause shoulder pain, including:  An injury to the area.  Overuse of the shoulder.  Arthritis. The source of the pain can be:  Inflammation.  An injury to the shoulder joint.  An injury to a tendon, ligament, or bone. Follow these instructions at home: Take these actions to help with your pain:  Squeeze a soft ball or a foam pad as much as possible. This helps to keep the shoulder from swelling. It also helps to strengthen the arm.  Take over-the-counter and prescription medicines only as told by your health care provider.  If directed, apply ice to the area:  Put ice in a plastic bag.  Place a towel between your skin and the bag.  Leave the ice on for 20 minutes, 2-3 times per day. Stop applying ice if it does not help with the pain.  If you were given a shoulder sling or immobilizer:  Wear it as told.  Remove it to shower or bathe.  Move your arm as little as possible, but keep your hand moving to prevent swelling. Contact a health care  provider if:  Your pain gets worse.  Your pain is not relieved with medicines.  New pain develops in your arm, hand, or fingers. Get help right away if:  Your arm, hand, or fingers:  Tingle.  Become numb.  Become swollen.  Become painful.  Turn white or blue. This information is not intended to replace advice given to you by your health care provider.  Make sure you discuss any questions you have with your health care provider. Document Released: 08/10/2005 Document Revised: 06/26/2016 Document Reviewed: 02/23/2015 Elsevier Interactive Patient Education  2017 [employer].

## 2016-11-01 NOTE — Assessment & Plan Note (Signed)
On Levothyroxine, continue to monitor 

## 2016-11-01 NOTE — Progress Notes (Signed)
Patient ID: Kayla [name], female   DOB: 1945-11-30, 70 y.o.   MRN: [id number]   Subjective:    Patient ID: Kayla [name], female    DOB: 04-10-1946, 70 y.o.   MRN: [id number]  Chief Complaint  Patient presents with  . Follow-up    HPI Patient is in today for Medicare well visit. Patient c/o right rib pain, soreness in th top of her head and also arpit pain along right axillary line. No recent fall or trauma. No recent hospitalization or febrile illness. She acknowledges high levels of anxiety and anhedonia but denies any suicidal ideation. Denies CP/palp/SOB/HA/congestion/fevers/GI or GU c/o. Taking meds as prescribed. Managing ADLs at home adequately.   Past Medical History:  Diagnosis Date  . Allergy    allergic rhinitis  . Anemia    NOS  . Arthritis    osteoarthritis  . Asthma   . Degenerative disc disease, lumbar 12/13/2015  . Depression   . Depression with anxiety 01/19/2009   Qualifier: Diagnosis of  By: Redmond Pulling MD, Frann Rider    . Fibromyalgia   . GERD (gastroesophageal reflux disease)   . Hair loss 11/01/2016  . Headache(784.0)   . History of diverticulitis of colon   . Hx of colonic polyps   . Hyperglycemia 11/23/2014  . Hyperlipidemia   . Medicare annual wellness visit, subsequent 11/01/2016  . Migraine   . Mild cognitive impairment 04/29/2016  . Miscarriage   . Obesity, unspecified 10/26/2013  . Otitis, externa, infective 11/23/2014  . Thyroid disease    hypothyroidism  . Urinary incontinence     Past Surgical History:  Procedure Laterality Date  . ABDOMINAL HYSTERECTOMY    . BLADDER SUSPENSION    . CHOLECYSTECTOMY    . TONSILLECTOMY      Family History  Problem Relation Age of Onset  . Cancer Other     female, brain, lung  . Heart disease Other   . Stroke Other   . Diabetes Other   . Arthritis Other   . Hyperlipidemia Other   . Hypertension Other   . Alzheimer's disease Mother   . Alzheimer's disease Father     Social History   Social History  .  Marital status: Divorced    Spouse name: N/A  . Number of children: N/A  . Years of education: N/A   Occupational History  . unemployed Retired   Social History Main Topics  . Smoking status: Never Smoker  . Smokeless tobacco: Never Used  . Alcohol use Yes  . Drug use: Unknown  . Sexual activity: Not on file   Other Topics Concern  . Not on file   Social History Narrative  . No narrative on file    Outpatient Medications Prior to Visit  Medication Sig Dispense Refill  . carisoprodol (SOMA) 350 MG tablet TAKE 1 TABLET BY MOUTH AT BEDTIME AS NEEDED FOR MUSCLE SPASM ALTERNATE WITH FLEXERIL 30 tablet 1  . clonazePAM (KLONOPIN) 1 MG tablet TAKE 1 TABLET BY MOUTH 3 TIMES A DAY 90 tablet 1  . cyclobenzaprine (FLEXERIL) 10 MG tablet Reported on 12/01/2015    . DULoxetine (CYMBALTA) 60 MG capsule TAKE 1 CAPSULE (60 MG TOTAL) BY MOUTH DAILY. 90 capsule 1  . fluconazole (DIFLUCAN) 150 MG tablet TAKE 2 TABLETS (300 MG TOTAL) BY MOUTH ONCE A WEEK. 4 tablet 2  . gabapentin (NEURONTIN) 300 MG capsule Take 300 mg by mouth at bedtime.     . hyoscyamine (LEVSIN SL) 0.125 MG SL tablet  Place 1 tablet (0.125 mg total) under the tongue every 4 (four) hours as needed. 30 tablet 1  . levothyroxine (SYNTHROID, LEVOTHROID) 75 MCG tablet TAKE 1 TABLET (75 MCG TOTAL) BY MOUTH DAILY. 30 tablet 6  . ondansetron (ZOFRAN) 4 MG tablet Take 1 tablet (4 mg total) by mouth every 8 (eight) hours as needed for nausea. 30 tablet 0  . ranitidine (ZANTAC) 300 MG tablet Take 1 tablet (300 mg total) by mouth at bedtime. 30 tablet 3  . topiramate (TOPAMAX) 25 MG tablet TAKE 1 TABLET (25 MG TOTAL) BY MOUTH 2 (TWO) TIMES DAILY. 60 tablet 2  . amitriptyline (ELAVIL) 50 MG tablet TAKE 1 TO 2 TABLETS BY MOUTH AT BEDTIME 60 tablet 3  . diclofenac (VOLTAREN) 75 MG EC tablet Take 1 tablet (75 mg total) by mouth 2 (two) times daily. 60 tablet 1  . NUCYNTA ER 100 MG TB12 Take 1 tablet by mouth every 12 (twelve) hours.  0   No  facility-administered medications prior to visit.     Allergies  Allergen Reactions  . Adhesive [Tape]     blisters  . Penicillins     REACTION: rash  . Promethazine Hcl Other (See Comments)    Jerky movements    Review of Systems  Constitutional: Positive for malaise/fatigue. Negative for fever.  Eyes: Negative for blurred vision.  Respiratory: Negative for cough and shortness of breath.   Cardiovascular: Negative for chest pain and palpitations.  Gastrointestinal: Negative for vomiting.  Musculoskeletal: Positive for joint pain, myalgias and neck pain.  Skin: Negative for rash.  Neurological: Negative for loss of consciousness and headaches.  Psychiatric/Behavioral: Positive for depression. The patient is nervous/anxious.        Objective:    Physical Exam  Constitutional: She is oriented to person, place, and time. She appears well-developed and well-nourished. No distress.  HENT:  Head: Normocephalic and atraumatic.  Eyes: Conjunctivae are normal.  Neck: Normal range of motion. Neck supple. No thyromegaly present.  Cardiovascular: Normal rate, regular rhythm and normal heart sounds.   No murmur heard. Pulmonary/Chest: Effort normal and breath sounds normal. No respiratory distress. She has no wheezes.  Abdominal: Soft. Bowel sounds are normal. She exhibits no distension and no mass. There is no tenderness.  Musculoskeletal: Normal range of motion. She exhibits no edema or deformity.  Lymphadenopathy:    She has no cervical adenopathy.  Neurological: She is alert and oriented to person, place, and time.  Skin: Skin is warm and dry. She is not diaphoretic.  Psychiatric: She has a normal mood and affect. Her behavior is normal.    BP 126/76 (BP Location: Left Arm, Patient Position: Sitting, Cuff Size: Normal)   Pulse 78   Temp 98.8 F (37.1 C) (Oral)   Ht 5\' 7"  (1.702 m)   Wt 224 lb 6.4 oz (101.8 kg)   LMP 11/14/1978   SpO2 96%   BMI 35.15 kg/m  Wt Readings  from Last 3 Encounters:  11/01/16 224 lb 6.4 oz (101.8 kg)  07/28/16 230 lb 3.2 oz (104.4 kg)  04/19/16 229 lb 6 oz (104 kg)     Lab Results  Component Value Date   WBC 11.1 (H) 11/01/2016   HGB 14.6 11/01/2016   HCT 44.2 11/01/2016   PLT 343.0 11/01/2016   GLUCOSE 82 11/01/2016   CHOL 266 (H) 11/01/2016   TRIG 93.0 11/01/2016   HDL 66.90 11/01/2016   LDLCALC 180 (H) 11/01/2016   ALT 19 11/01/2016  AST 16 11/01/2016   NA 140 11/01/2016   K 5.0 11/01/2016   CL 103 11/01/2016   CREATININE 0.86 11/01/2016   BUN 13 11/01/2016   CO2 27 11/01/2016   TSH 0.46 11/01/2016   HGBA1C 5.6 11/01/2016    Lab Results  Component Value Date   TSH 0.46 11/01/2016   Lab Results  Component Value Date   WBC 11.1 (H) 11/01/2016   HGB 14.6 11/01/2016   HCT 44.2 11/01/2016   MCV 85.7 11/01/2016   PLT 343.0 11/01/2016   Lab Results  Component Value Date   NA 140 11/01/2016   K 5.0 11/01/2016   CO2 27 11/01/2016   GLUCOSE 82 11/01/2016   BUN 13 11/01/2016   CREATININE 0.86 11/01/2016   BILITOT 0.6 11/01/2016   ALKPHOS 85 11/01/2016   AST 16 11/01/2016   ALT 19 11/01/2016   PROT 7.4 11/01/2016   ALBUMIN 4.2 11/01/2016   CALCIUM 9.6 11/01/2016   GFR 69.25 11/01/2016   Lab Results  Component Value Date   CHOL 266 (H) 11/01/2016   Lab Results  Component Value Date   HDL 66.90 11/01/2016   Lab Results  Component Value Date   LDLCALC 180 (H) 11/01/2016   Lab Results  Component Value Date   TRIG 93.0 11/01/2016   Lab Results  Component Value Date   CHOLHDL 4 11/01/2016   Lab Results  Component Value Date   HGBA1C 5.6 11/01/2016   I acted as a Education administrator for Dr. Charlett Blake. Princess, RMA     Assessment & Plan:   Problem List Items Addressed This Visit    Obesity    Encouraged DASH diet, decrease po intake and increase exercise as tolerated. Needs 7-8 hours of sleep nightly. Avoid trans fats, eat small, frequent meals every 4-5 hours with lean proteins, complex carbs  and healthy fats. Minimize simple carbs      Hypothyroidism    On Levothyroxine, continue to monitor      Relevant Orders   TSH (Completed)   Hyperlipidemia    Encouraged heart healthy diet, increase exercise, avoid trans fats, consider a krill oil cap daily      Relevant Medications   atorvastatin (LIPITOR) 10 MG tablet   Other Relevant Orders   Lipid panel (Completed)   Depression with anxiety    Struggles with persistent anxiety but does feel her meds are helping so is not interested in chaning meds at this time.      Osteoarthritis    Right shoulder ongoing and variable over past few months. Xray showed moderate arthritis, declines referral to sports med.      Hyperglycemia    hgba1c acceptable, minimize simple carbs. Increase exercise as tolerated.       Relevant Orders   Hemoglobin A1c (Completed)   Mild cognitive impairment    Ran MMSE today      Hair loss    Worse over past month, start zinc ena fatty acids, check labs with TSH and refer to derm if persists      Medicare annual wellness visit, subsequent    Patient denies any difficulties at home. No trouble with ADLs, depression or falls. See EMR for functional status screen and depression screen. No recent changes to vision or hearing. Is UTD with immunizations. Is UTD with screening. Discussed Advanced Directives. Encouraged heart healthy diet, exercise as tolerated and adequate sleep. See patient's problem list for health risk factors to monitor. See AVS for preventative healthcare recommendation schedule.  Given and reviewed copy of ACP documents from [facility] Secretary of State and encouraged to complete and return      Relevant Orders   CBC with Differential/Platelet (Completed)   Comprehensive metabolic panel (Completed)   Lipid panel (Completed)   TSH (Completed)   Hemoglobin A1c (Completed)    Other Visit Diagnoses    Abnormal white blood cell (WBC) count    -  Primary   Relevant Orders   CBC with  Differential/Platelet (Completed)      I have discontinued Ms. Germond's NUCYNTA ER. I have also changed her amitriptyline. Additionally, I am having her start on atorvastatin. Lastly, I am having her maintain her cyclobenzaprine, gabapentin, ondansetron, hyoscyamine, ranitidine, carisoprodol, DULoxetine, topiramate, clonazePAM, fluconazole, and levothyroxine.  Meds ordered this encounter  Medications  . atorvastatin (LIPITOR) 10 MG tablet    Sig: Take half a tablet everyday    Dispense:  30 tablet    Refill:  1  . amitriptyline (ELAVIL) 50 MG tablet    Sig: Take 3 tablets by mouth at bedtime    Dispense:  90 tablet    Refill:  3    NO REFILLS REMAINING  CMA served as scribe during this visit. History, Physical and Plan performed by medical provider. Documentation and orders reviewed and attested to.    Penni Homans, MD

## 2016-11-02 LAB — CBC WITH DIFFERENTIAL/PLATELET
BASOS PCT: 0.1 % (ref 0.0–3.0)
Basophils Absolute: 0 10*3/uL (ref 0.0–0.1)
EOS PCT: 1.2 % (ref 0.0–5.0)
Eosinophils Absolute: 0.1 10*3/uL (ref 0.0–0.7)
HEMATOCRIT: 44.2 % (ref 36.0–46.0)
HEMOGLOBIN: 14.6 g/dL (ref 12.0–15.0)
LYMPHS PCT: 22.7 % (ref 12.0–46.0)
Lymphs Abs: 2.5 10*3/uL (ref 0.7–4.0)
MCHC: 33.1 g/dL (ref 30.0–36.0)
MCV: 85.7 fl (ref 78.0–100.0)
Monocytes Absolute: 0.1 10*3/uL (ref 0.1–1.0)
Monocytes Relative: 0.7 % — ABNORMAL LOW (ref 3.0–12.0)
NEUTROS ABS: 8.4 10*3/uL — AB (ref 1.4–7.7)
Neutrophils Relative %: 75.3 % (ref 43.0–77.0)
PLATELETS: 343 10*3/uL (ref 150.0–400.0)
RBC: 5.15 Mil/uL — ABNORMAL HIGH (ref 3.87–5.11)
RDW: 13.5 % (ref 11.5–15.5)
WBC: 11.1 10*3/uL — AB (ref 4.0–10.5)

## 2016-11-02 LAB — COMPREHENSIVE METABOLIC PANEL
ALBUMIN: 4.2 g/dL (ref 3.5–5.2)
ALT: 19 U/L (ref 0–35)
AST: 16 U/L (ref 0–37)
Alkaline Phosphatase: 85 U/L (ref 39–117)
BUN: 13 mg/dL (ref 6–23)
CALCIUM: 9.6 mg/dL (ref 8.4–10.5)
CHLORIDE: 103 meq/L (ref 96–112)
CO2: 27 mEq/L (ref 19–32)
Creatinine, Ser: 0.86 mg/dL (ref 0.40–1.20)
GFR: 69.25 mL/min (ref >60.00)
Glucose, Bld: 82 mg/dL (ref 70–99)
POTASSIUM: 5 meq/L (ref 3.5–5.1)
Sodium: 140 mEq/L (ref 135–145)
Total Bilirubin: 0.6 mg/dL (ref 0.2–1.2)
Total Protein: 7.4 g/dL (ref 6.0–8.3)

## 2016-11-02 LAB — LIPID PANEL
CHOLESTEROL: 266 mg/dL — AB (ref 0–200)
HDL: 66.9 mg/dL (ref >39.00)
LDL CALC: 180 mg/dL — AB (ref 0–99)
NonHDL: 199.01
TRIGLYCERIDES: 93 mg/dL (ref 0.0–149.0)
Total CHOL/HDL Ratio: 4
VLDL: 18.6 mg/dL (ref 0.0–40.0)

## 2016-11-02 LAB — TSH: TSH: 0.46 u[IU]/mL (ref 0.35–4.50)

## 2016-11-02 LAB — HEMOGLOBIN A1C: HEMOGLOBIN A1C: 5.6 % (ref 4.6–6.5)

## 2016-11-15 ENCOUNTER — Other Ambulatory Visit: Payer: Self-pay | Admitting: Family Medicine

## 2016-11-16 NOTE — Assessment & Plan Note (Signed)
Struggles with persistent anxiety but does feel her meds are helping so is not interested in chaning meds at this time.

## 2016-11-16 NOTE — Assessment & Plan Note (Signed)
Encouraged DASH diet, decrease po intake and increase exercise as tolerated. Needs 7-8 hours of sleep nightly. Avoid trans fats, eat small, frequent meals every 4-5 hours with lean proteins, complex carbs and healthy fats. Minimize simple carbs 

## 2016-11-16 NOTE — Assessment & Plan Note (Signed)
Patient with multiple complaints of pain, notes soreness on top of head pain in arms and on right side of chest. Encouraged to stay as active as tolerated. Get adequate rest and try topical treatments.

## 2016-12-02 ENCOUNTER — Other Ambulatory Visit: Payer: Self-pay | Admitting: Family Medicine

## 2016-12-15 ENCOUNTER — Ambulatory Visit: Payer: Self-pay | Admitting: Family Medicine

## 2016-12-28 ENCOUNTER — Ambulatory Visit: Payer: Self-pay | Admitting: Medical

## 2017-01-13 ENCOUNTER — Ambulatory Visit: Payer: Self-pay | Admitting: Family Medicine

## 2017-01-29 ENCOUNTER — Other Ambulatory Visit: Payer: Self-pay | Admitting: Family Medicine

## 2017-01-30 DIAGNOSIS — K582 Mixed irritable bowel syndrome: Secondary | ICD-10-CM | POA: Diagnosis not present

## 2017-01-30 DIAGNOSIS — R1010 Upper abdominal pain, unspecified: Secondary | ICD-10-CM | POA: Diagnosis not present

## 2017-01-30 DIAGNOSIS — K219 Gastro-esophageal reflux disease without esophagitis: Secondary | ICD-10-CM | POA: Diagnosis not present

## 2017-01-30 DIAGNOSIS — R198 Other specified symptoms and signs involving the digestive system and abdomen: Secondary | ICD-10-CM | POA: Diagnosis not present

## 2017-01-30 DIAGNOSIS — R1310 Dysphagia, unspecified: Secondary | ICD-10-CM | POA: Diagnosis not present

## 2017-01-30 NOTE — Telephone Encounter (Signed)
Just now realized her list says clonazepam and she is asking for alprazolam she can have either but not both

## 2017-01-30 NOTE — Telephone Encounter (Signed)
Requesting:  alprazolam Contract    09/09/2014 UDS   Moderated --due Last OV     11/01/2016--no future appts scheduled. Last Refill    #90 with 1 refill on 09/02/2016  Please Advise

## 2017-01-30 NOTE — Telephone Encounter (Signed)
Can have refill after uds and contract updated. Can have #90 with 1 rf but make sure she has an appt at the 6 month mark

## 2017-01-31 ENCOUNTER — Encounter: Payer: Self-pay | Admitting: Family Medicine

## 2017-01-31 NOTE — Telephone Encounter (Signed)
No worries, ok to fill the Clonazepam as instructed

## 2017-01-31 NOTE — Telephone Encounter (Signed)
Sorry my mistake I put alprazolam when the request is for clonazepam which is on her list, not alprazolam  Sorry. Ok to [employer]

## 2017-01-31 NOTE — Telephone Encounter (Signed)
Called left detailed message hardcopy for clonazepam is ready for pickup and to update contract and UDS.

## 2017-01-31 NOTE — Telephone Encounter (Signed)
Printed prescription/PCP signed/Contract printed/UDS noted on script to do. Called the patient left detailed message to call me back.

## 2017-02-16 ENCOUNTER — Encounter: Payer: Self-pay | Admitting: Family Medicine

## 2017-02-16 DIAGNOSIS — R1310 Dysphagia, unspecified: Secondary | ICD-10-CM | POA: Diagnosis not present

## 2017-02-16 DIAGNOSIS — K222 Esophageal obstruction: Secondary | ICD-10-CM | POA: Diagnosis not present

## 2017-02-16 DIAGNOSIS — K2970 Gastritis, unspecified, without bleeding: Secondary | ICD-10-CM | POA: Diagnosis not present

## 2017-02-16 DIAGNOSIS — Z79899 Other long term (current) drug therapy: Secondary | ICD-10-CM | POA: Diagnosis not present

## 2017-02-16 DIAGNOSIS — Z86010 Personal history of colonic polyps: Secondary | ICD-10-CM | POA: Diagnosis not present

## 2017-02-16 DIAGNOSIS — K648 Other hemorrhoids: Secondary | ICD-10-CM | POA: Diagnosis not present

## 2017-02-16 DIAGNOSIS — K5730 Diverticulosis of large intestine without perforation or abscess without bleeding: Secondary | ICD-10-CM | POA: Diagnosis not present

## 2017-02-16 DIAGNOSIS — K580 Irritable bowel syndrome with diarrhea: Secondary | ICD-10-CM | POA: Diagnosis not present

## 2017-02-16 DIAGNOSIS — K449 Diaphragmatic hernia without obstruction or gangrene: Secondary | ICD-10-CM | POA: Diagnosis not present

## 2017-02-16 DIAGNOSIS — K219 Gastro-esophageal reflux disease without esophagitis: Secondary | ICD-10-CM | POA: Diagnosis not present

## 2017-02-16 DIAGNOSIS — R1010 Upper abdominal pain, unspecified: Secondary | ICD-10-CM | POA: Diagnosis not present

## 2017-02-16 DIAGNOSIS — K2950 Unspecified chronic gastritis without bleeding: Secondary | ICD-10-CM | POA: Diagnosis not present

## 2017-02-16 DIAGNOSIS — K209 Esophagitis, unspecified: Secondary | ICD-10-CM | POA: Diagnosis not present

## 2017-04-11 ENCOUNTER — Ambulatory Visit: Payer: Self-pay | Admitting: Family Medicine

## 2017-04-11 ENCOUNTER — Emergency Department (HOSPITAL_BASED_OUTPATIENT_CLINIC_OR_DEPARTMENT_OTHER)
Admission: EM | Admit: 2017-04-11 | Discharge: 2017-04-11 | Disposition: A | Discharge status: Home / Self Care | Payer: Medicare Other | Attending: Emergency Medicine | Admitting: Emergency Medicine

## 2017-04-11 ENCOUNTER — Emergency Department (HOSPITAL_BASED_OUTPATIENT_CLINIC_OR_DEPARTMENT_OTHER): Payer: Medicare Other

## 2017-04-11 ENCOUNTER — Encounter (HOSPITAL_BASED_OUTPATIENT_CLINIC_OR_DEPARTMENT_OTHER): Payer: Self-pay

## 2017-04-11 DIAGNOSIS — S79911A Unspecified injury of right hip, initial encounter: Secondary | ICD-10-CM | POA: Diagnosis present

## 2017-04-11 DIAGNOSIS — E039 Hypothyroidism, unspecified: Secondary | ICD-10-CM | POA: Insufficient documentation

## 2017-04-11 DIAGNOSIS — Y939 Activity, unspecified: Secondary | ICD-10-CM | POA: Insufficient documentation

## 2017-04-11 DIAGNOSIS — J45909 Unspecified asthma, uncomplicated: Secondary | ICD-10-CM | POA: Diagnosis not present

## 2017-04-11 DIAGNOSIS — M25551 Pain in right hip: Secondary | ICD-10-CM | POA: Diagnosis not present

## 2017-04-11 DIAGNOSIS — Y929 Unspecified place or not applicable: Secondary | ICD-10-CM | POA: Insufficient documentation

## 2017-04-11 DIAGNOSIS — W19XXXA Unspecified fall, initial encounter: Secondary | ICD-10-CM | POA: Insufficient documentation

## 2017-04-11 DIAGNOSIS — Y999 Unspecified external cause status: Secondary | ICD-10-CM | POA: Diagnosis not present

## 2017-04-11 DIAGNOSIS — R5383 Other fatigue: Secondary | ICD-10-CM | POA: Diagnosis not present

## 2017-04-11 MED ORDER — HYDROMORPHONE HCL 1 MG/ML IJ SOLN
2.0000 mg | Freq: Once | INTRAMUSCULAR | Status: AC
  Administered 2017-04-11: 2 mg via INTRAMUSCULAR
  Filled 2017-04-11: qty 2

## 2017-04-11 MED ORDER — HYDROCODONE-ACETAMINOPHEN 5-325 MG PO TABS: 1.0000 | ORAL_TABLET | Freq: Four times a day (QID) | ORAL | 0 refills | Status: DC | PRN

## 2017-04-11 MED FILL — HYDROCODON-APAP 5-325: 5-325 | 2 days supply | Qty: 10 | Fill #0

## 2017-04-11 NOTE — ED Notes (Signed)
Patient transported to CT 

## 2017-04-11 NOTE — ED Triage Notes (Signed)
Pt states she fell "weeks ago"-pain to right groin and right hip area-pt with limping gait

## 2017-04-11 NOTE — ED Provider Notes (Signed)
[facility] DEPT [facility] Provider Note   CSN: [id number] Arrival date & time: 04/11/17  1141     History   Chief Complaint Chief Complaint  Patient presents with  . Fall    HPI Kayla [name] is a 71 y.o. female.  Patient with a fall 3 weeks ago landing on her right hip. Patient with some discomfort in the right hip at that time. In the last week it's gotten worse. Much worse with movement and standing. Bit better first thing in the morning. Patient does have a history of fibromyalgia. Patient is followed by primary care upstairs. She had an appointment today but got their late and they  were not able to see her today. no other concerns for injury.      Past Medical History:  Diagnosis Date  . Allergy    allergic rhinitis  . Anemia    NOS  . Arthritis    osteoarthritis  . Asthma   . Degenerative disc disease, lumbar 12/13/2015  . Depression   . Depression with anxiety 01/19/2009   Qualifier: Diagnosis of  By: Redmond Pulling MD, Frann Rider    . Fibromyalgia   . GERD (gastroesophageal reflux disease)   . Hair loss 11/01/2016  . Headache(784.0)   . History of diverticulitis of colon   . Hx of colonic polyps   . Hyperglycemia 11/23/2014  . Hyperlipidemia   . Medicare annual wellness visit, subsequent 11/01/2016  . Migraine   . Mild cognitive impairment 04/29/2016  . Miscarriage   . Obesity, unspecified 10/26/2013  . Otitis, externa, infective 11/23/2014  . Thyroid disease    hypothyroidism  . Urinary incontinence     Patient Active Problem List   Diagnosis Date Noted  . Hair loss 11/01/2016  . Medicare annual wellness visit, subsequent 11/01/2016  . Mild cognitive impairment 04/29/2016  . Dysphagia 04/03/2016  . Hereditary and idiopathic peripheral neuropathy 04/03/2016  . Degenerative disc disease, lumbar 12/13/2015  . Right axillary swelling 06/15/2015  . Breast cancer screening 06/15/2015  . Lesion of ear canal 02/22/2015  . Knee pain, bilateral 02/12/2015  . Headache  02/12/2015  . Insomnia 02/12/2015  . Hyperglycemia 11/23/2014  . Otitis, externa, infective 11/23/2014  . Trigeminal neuralgia 08/03/2014  . Hiatal hernia with gastroesophageal reflux 03/09/2014  . Abdominal pain 02/08/2014  . Obesity 10/26/2013  . Bladder pain 05/04/2012  . Asthma 05/04/2012  . Musculoskeletal pain 02/01/2012  . Fecal incontinence 12/13/2011  . Vaginal itching 12/13/2011  . Hip pain, right 06/10/2011  . POSTMENOPAUSAL STATUS 09/21/2010  . MUSCLE WEAKNESS (GENERALIZED) 08/31/2010  . SOMNOLENCE 03/09/2010  . Hypothyroidism 01/19/2009  . Hyperlipidemia 01/19/2009  . Anemia 01/19/2009  . Depression with anxiety 01/19/2009  . Chronic pain syndrome 01/19/2009  . COMMON MIGRAINE 01/19/2009  . Allergic rhinitis 01/19/2009  . GERD 01/19/2009  . Osteoarthritis 01/19/2009  . FIBROMYALGIA 01/19/2009  . Fatigue due to depression 01/19/2009  . Urinary incontinence 01/19/2009  . COLONIC POLYPS, HX OF 01/19/2009  . DIVERTICULITIS, HX OF 01/19/2009    Past Surgical History:  Procedure Laterality Date  . ABDOMINAL HYSTERECTOMY    . BLADDER SUSPENSION    . CHOLECYSTECTOMY    . TONSILLECTOMY      OB History    No data available       Home Medications    Prior to Admission medications   Medication Sig Start Date End Date Taking? Authorizing Provider  carisoprodol (SOMA) 350 MG tablet TAKE 1 TABLET BY MOUTH AT BEDTIME AS NEEDED FOR  MUSCLE SPASM ALTERNATE WITH FLEXERIL 12/01/15   Mosie Lukes, MD  clonazePAM (KLONOPIN) 1 MG tablet TAKE 1 TABLET BY MOUTH 3 TIMES A DAY 01/31/17   Mosie Lukes, MD  cyclobenzaprine (FLEXERIL) 10 MG tablet Reported on 12/01/2015 11/16/11   [provider]  diclofenac (VOLTAREN) 75 MG EC tablet TAKE 1 TABLET BY MOUTH TWICE A DAY 01/31/17   Mosie Lukes, MD  DULoxetine (CYMBALTA) 60 MG capsule TAKE 1 CAPSULE (60 MG TOTAL) BY MOUTH DAILY. 12/02/16   Mosie Lukes, MD  fluconazole (DIFLUCAN) 150 MG tablet TAKE 2 TABLETS (300 MG  TOTAL) BY MOUTH ONCE A WEEK. 09/15/16   Mosie Lukes, MD  HYDROcodone-acetaminophen (NORCO/VICODIN) 5-325 MG tablet Take 1-2 tablets by mouth every 6 (six) hours as needed for moderate pain. 04/11/17   Fredia Sorrow, MD  levothyroxine (SYNTHROID, LEVOTHROID) 75 MCG tablet TAKE 1 TABLET (75 MCG TOTAL) BY MOUTH DAILY. 10/25/16   Mosie Lukes, MD  ondansetron (ZOFRAN) 4 MG tablet Take 1 tablet (4 mg total) by mouth every 8 (eight) hours as needed for nausea. 12/22/12   Debbrah Alar, NP    Family History Family History  Problem Relation Age of Onset  . Alzheimer's disease Mother   . Alzheimer's disease Father   . Cancer Other        female, brain, lung  . Heart disease Other   . Stroke Other   . Diabetes Other   . Arthritis Other   . Hyperlipidemia Other   . Hypertension Other     Social History Social History  Substance Use Topics  . Smoking status: Never Smoker  . Smokeless tobacco: Never Used  . Alcohol use No     Allergies   Adhesive [tape]; Penicillins; and Promethazine hcl   Review of Systems Review of Systems  Constitutional: Positive for fatigue. Negative for fever.  HENT: Negative for congestion.   Eyes: Negative for visual disturbance.  Respiratory: Negative for shortness of breath.   Cardiovascular: Negative for chest pain.  Gastrointestinal: Negative for abdominal pain.  Genitourinary: Negative for dysuria.  Musculoskeletal: Positive for arthralgias and myalgias. Negative for back pain and neck pain.  Skin: Negative for rash.  Neurological: Positive for weakness. Negative for headaches.  Hematological: Does not bruise/bleed easily.  Psychiatric/Behavioral: Negative for confusion.     Physical Exam Updated Vital Signs BP 122/77 (BP Location: Right Arm)   Pulse 68   Temp 98.4 F (36.9 C) (Oral)   Resp 20   Wt 105.9 kg (233 lb 6 oz)   LMP 11/14/1978   SpO2 98%   BMI 36.55 kg/m   Physical Exam  Constitutional: She is oriented to person,  place, and time. She appears well-developed and well-nourished. No distress.  HENT:  Head: Normocephalic and atraumatic.  Eyes: Conjunctivae and EOM are normal. Pupils are equal, round, and reactive to light.  Neck: Neck supple.  Cardiovascular: Normal rate, regular rhythm and normal heart sounds.   Pulmonary/Chest: Effort normal and breath sounds normal. No respiratory distress.  Abdominal: Soft. Bowel sounds are normal. There is no tenderness.  Musculoskeletal: Normal range of motion. She exhibits tenderness. She exhibits no edema.  Pain to her right hip with range of motion. No obvious deformity. Distally sensation intact. Good cap refill.  Neurological: She is alert and oriented to person, place, and time. No cranial nerve deficit or sensory deficit. She exhibits normal muscle tone. Coordination normal.  Skin: Skin is warm. Capillary refill takes less than 2  seconds. No erythema.  Nursing note and vitals reviewed.    ED Treatments / Results  Labs (all labs ordered are listed, but only abnormal results are displayed) Labs Reviewed - No data to display  EKG  EKG Interpretation None       Radiology Ct Hip Right Wo Contrast  Result Date: 04/11/2017 CLINICAL DATA:  Right hip and groin pain since a fall several weeks ago. EXAM: CT OF THE RIGHT HIP WITHOUT CONTRAST TECHNIQUE: Multidetector CT imaging of the right hip was performed according to the standard protocol. Multiplanar CT image reconstructions were also generated. COMPARISON:  Plain films right hip the 04/11/2017. FINDINGS: Bones/Joint/Cartilage No acute bony or joint abnormality is identified. Mild joint space narrowing is present about the right hip. No focal bony lesion is identified. No avascular necrosis of the right femoral head. Ligaments Suboptimally assessed by CT. Muscles and Tendons Intact. Soft tissues Small to moderate right hip joint effusion is noted. Imaged intrapelvic contents demonstrate postoperative change of  hysterectomy. There is some sigmoid diverticulosis is present. IMPRESSION: Negative for fracture or other acute bony abnormality. Small to moderate right hip joint effusion. Sigmoid diverticulosis. Electronically Signed   By: Inge Rise M.D.   On: 04/11/2017 14:28   Dg Hip Unilat W Or Wo Pelvis 2-3 Views Right  Result Date: 04/11/2017 CLINICAL DATA:  Right hip and groin pain following a fall several weeks ago. EXAM: DG HIP (WITH OR WITHOUT PELVIS) 2-3V RIGHT COMPARISON:  None. FINDINGS: Normal appearing right hip without fracture or dislocation. Lower lumbar spine degenerative changes. The included left hip is unremarkable. IMPRESSION: 1. Normal appearing right hip. 2. Lower lumbar spine degenerative changes. Electronically Signed   By: Claudie Revering M.D.   On: 04/11/2017 13:16    Procedures Procedures (including critical care time)  Medications Ordered in ED Medications  HYDROmorphone (DILAUDID) injection 2 mg (2 mg Intramuscular Given 04/11/17 1335)     Initial Impression / Assessment and Plan / ED Course  I have reviewed the triage vital signs and the nursing notes.  Pertinent labs & imaging results that were available during my care of the patient were reviewed by me and considered in my medical decision making (see chart for details).    Patient with right hip pain following a fall. X-rays of the hip and pelvis without any bony injuries. CT scan of the right hip was done to rule out any hairline fracture. That did show evidence of an effusion in the right hip. No obvious arthritis. Some of patient's symptoms may be related to her fibromyalgia. We'll have her follow-up with sports medicine and her primary care doctor.   Final Clinical Impressions(s) / ED Diagnoses   Final diagnoses:  Fall, initial encounter  Pain of right hip joint    New Prescriptions New Prescriptions   HYDROCODONE-ACETAMINOPHEN (NORCO/VICODIN) 5-325 MG TABLET    Take 1-2 tablets by mouth every 6 (six)  hours as needed for moderate pain.     Fredia Sorrow, MD 04/11/17 [phone]

## 2017-04-11 NOTE — Discharge Instructions (Signed)
CT scan of the hip and x-rays of the hip and pelvis without any bony injuries. There is some fluid on the right hip. Recommend follow-up with sports medicine upstairs make an appointment. Also follow-up with your primary care doctor for additional treatments for the fibromyalgia. Short course of pain medicine has been provided.

## 2017-04-13 ENCOUNTER — Ambulatory Visit (INDEPENDENT_AMBULATORY_CARE_PROVIDER_SITE_OTHER): Payer: Medicare Other | Admitting: Medical

## 2017-04-13 VITALS — BP 117/74 | HR 74 | Temp 98.2°F | Resp 16 | Ht 67.0 in | Wt 234.6 lb

## 2017-04-13 DIAGNOSIS — L908 Other atrophic disorders of skin: Secondary | ICD-10-CM

## 2017-04-13 DIAGNOSIS — M25551 Pain in right hip: Secondary | ICD-10-CM | POA: Diagnosis not present

## 2017-04-13 DIAGNOSIS — L988 Other specified disorders of the skin and subcutaneous tissue: Secondary | ICD-10-CM

## 2017-04-13 MED ORDER — HYDROCODONE-ACETAMINOPHEN 5-325 MG PO TABS: ORAL_TABLET | ORAL | 0 refills | Status: DC

## 2017-04-13 MED ORDER — TRETINOIN (FACIAL WRINKLES) 0.02 % EX CREA: TOPICAL_CREAM | CUTANEOUS | 1 refills | Status: DC

## 2017-04-13 MED ORDER — KETOROLAC TROMETHAMINE 60 MG/2ML IM SOLN
60.0000 mg | Freq: Once | INTRAMUSCULAR | Status: AC
  Administered 2017-04-13: 60 mg via INTRAMUSCULAR

## 2017-04-13 MED ORDER — GABAPENTIN 100 MG PO CAPS
100.0000 mg | ORAL_CAPSULE | Freq: Every day | ORAL | 0 refills | Status: DC
Start: 2017-04-13 — End: 2017-05-30

## 2017-04-13 NOTE — Progress Notes (Signed)
Subjective:    Patient ID: Kayla [name], female    DOB: 05/25/1946, 71 y.o.   MRN: [id number]  HPI   Pt in for follow up from ED.  Pt in states she fell about 2 weeks ago and her hip was mild sore. Then when she woke up on past Thursday pain was moderate to severe. For one week pain has been painful early afternoon of each day after she walks around her house. Pt states in the past would get mild hip pain but not like this. Pt does have fibromyalgia so pain possible intensified.  Pt was given norco by the ED. Pt was given 10 tabs of norco for pain. Pt also has muscle relaxant.   Pt tooks 1 tab of norco and that did help but two did seem to help. She has about 2-3 tabs left.   She also request gabapentin. She used in past but got off due to cost. She was refill again for lower extremity nerve pain.   Pt also states she used to use Renova prescribed by former dermatologist for wrinkles.   Review of Systems  Constitutional: Negative for chills and fatigue.  HENT: Negative for congestion, ear discharge, ear pain and postnasal drip.   Respiratory: Negative for cough, chest tightness, shortness of breath and wheezing.   Cardiovascular: Negative for chest pain and palpitations.  Gastrointestinal: Negative for abdominal pain, constipation, diarrhea, nausea and vomiting.  Musculoskeletal: Negative for back pain, gait problem and neck stiffness.       Rt hip pain.  Skin: Negative for rash.       Concern for facial wrinkles and she wants to treat and prevent.  Neurological: Negative for dizziness, seizures, weakness and headaches.  Hematological: Negative for adenopathy. Does not bruise/bleed easily.  Psychiatric/Behavioral: Negative for behavioral problems, confusion and dysphoric mood.   Past Medical History:  Diagnosis Date  . Allergy    allergic rhinitis  . Anemia    NOS  . Arthritis    osteoarthritis  . Asthma   . Degenerative disc disease, lumbar 12/13/2015  . Depression   .  Depression with anxiety 01/19/2009   Qualifier: Diagnosis of  By: Redmond Pulling MD, Frann Rider    . Fibromyalgia   . GERD (gastroesophageal reflux disease)   . Hair loss 11/01/2016  . Headache(784.0)   . History of diverticulitis of colon   . Hx of colonic polyps   . Hyperglycemia 11/23/2014  . Hyperlipidemia   . Medicare annual wellness visit, subsequent 11/01/2016  . Migraine   . Mild cognitive impairment 04/29/2016  . Miscarriage   . Obesity, unspecified 10/26/2013  . Otitis, externa, infective 11/23/2014  . Thyroid disease    hypothyroidism  . Urinary incontinence      Social History   Social History  . Marital status: Divorced    Spouse name: N/A  . Number of children: N/A  . Years of education: N/A   Occupational History  . unemployed Retired   Social History Main Topics  . Smoking status: Never Smoker  . Smokeless tobacco: Never Used  . Alcohol use No  . Drug use: No  . Sexual activity: Not on file   Other Topics Concern  . Not on file   Social History Narrative  . No narrative on file    Past Surgical History:  Procedure Laterality Date  . ABDOMINAL HYSTERECTOMY    . BLADDER SUSPENSION    . CHOLECYSTECTOMY    . TONSILLECTOMY  Family History  Problem Relation Age of Onset  . Alzheimer's disease Mother   . Alzheimer's disease Father   . Cancer Other        female, brain, lung  . Heart disease Other   . Stroke Other   . Diabetes Other   . Arthritis Other   . Hyperlipidemia Other   . Hypertension Other     Allergies  Allergen Reactions  . Adhesive [Tape]     blisters  . Penicillins     REACTION: rash  . Promethazine Hcl Other (See Comments)    Jerky movements    Current Outpatient Prescriptions on File Prior to Visit  Medication Sig Dispense Refill  . carisoprodol (SOMA) 350 MG tablet TAKE 1 TABLET BY MOUTH AT BEDTIME AS NEEDED FOR MUSCLE SPASM ALTERNATE WITH FLEXERIL 30 tablet 1  . clonazePAM (KLONOPIN) 1 MG tablet TAKE 1 TABLET BY MOUTH 3  TIMES A DAY 90 tablet 1  . cyclobenzaprine (FLEXERIL) 10 MG tablet Reported on 12/01/2015    . diclofenac (VOLTAREN) 75 MG EC tablet TAKE 1 TABLET BY MOUTH TWICE A DAY 60 tablet 1  . DULoxetine (CYMBALTA) 60 MG capsule TAKE 1 CAPSULE (60 MG TOTAL) BY MOUTH DAILY. 90 capsule 1  . fluconazole (DIFLUCAN) 150 MG tablet TAKE 2 TABLETS (300 MG TOTAL) BY MOUTH ONCE A WEEK. 4 tablet 2  . HYDROcodone-acetaminophen (NORCO/VICODIN) 5-325 MG tablet Take 1-2 tablets by mouth every 6 (six) hours as needed for moderate pain. 10 tablet 0  . levothyroxine (SYNTHROID, LEVOTHROID) 75 MCG tablet TAKE 1 TABLET (75 MCG TOTAL) BY MOUTH DAILY. 30 tablet 6  . ondansetron (ZOFRAN) 4 MG tablet Take 1 tablet (4 mg total) by mouth every 8 (eight) hours as needed for nausea. 30 tablet 0   No current facility-administered medications on file prior to visit.     BP 117/74 (BP Location: Left Arm, Patient Position: Sitting, Cuff Size: Large)   Pulse 74   Temp 98.2 F (36.8 C) (Oral)   Resp 16   Ht 5\' 7"  (1.702 m)   Wt 234 lb 9.6 oz (106.4 kg)   LMP 11/14/1978   SpO2 95%   BMI 36.74 kg/m      Objective:   Physical Exam  General- No acute distress. Pleasant patient. Neck- Full range of motion, no jvd Lungs- Clear, even and unlabored. Heart- regular rate and rhythm. Neurologic- CNII- XII grossly intact.  Rt hip- pain on palpation and on abduction.       Assessment & Plan:  For your rt hip pain we gave toradol 60 mg im.  Can fill norco tomorrow or after current norco tabs expire.  Referring to sport medicine and trying to get you on Monday.  For history of age related wrinkles rx tretinoin.  Follow up with [place] as needed post sport med visit.  Did fill neurontin  rx for her to use at night.  Doria Fern, Percell Miller, PA-C

## 2017-04-13 NOTE — Patient Instructions (Addendum)
For your rt hip pain we gave toradol 60 mg im.  Can fill norco tomorrow or after current norco tabs expire.  Referring to sport medicine and trying to get you on Monday.  For history of age related wrinkles rx tretinoin.  Follow up with [place] as needed post sport med visit.

## 2017-04-17 ENCOUNTER — Encounter: Payer: Self-pay | Admitting: Family Medicine

## 2017-04-17 ENCOUNTER — Ambulatory Visit (INDEPENDENT_AMBULATORY_CARE_PROVIDER_SITE_OTHER): Payer: Medicare Other | Admitting: Family Medicine

## 2017-04-17 DIAGNOSIS — M25551 Pain in right hip: Secondary | ICD-10-CM | POA: Diagnosis not present

## 2017-04-17 NOTE — Patient Instructions (Signed)
Your pain is due to arthritis with an effusion (fluid in hip), a small portion due to IT band syndrome. These are the different medications you can take for this: Tylenol 500mg  1-2 tabs three times a day for pain. Diclofenac twice a day with food Capsaicin, aspercreme, or biofreeze topically up to four times a day may also help with pain. Some supplements that may help for arthritis: Boswellia extract, curcumin, pycnogenol Cortisone injections are an option - let me know if you want [place] to set this up. It's important that you continue to stay active. Hip side raise exercise and straight leg raise 3 sets of 10 once a day. Consider physical therapy to strengthen muscles around the joint that hurts to take pressure off of the joint itself - let us know if you want to do this and we can arrange it. Shoe inserts with good arch support may be helpful. Walker or cane if needed. Heat or ice 15 minutes at a time 3-4 times a day as needed to help with pain. Water aerobics and cycling with low resistance are the best two types of exercise for arthritis. Let me know if you want to do the above. Follow up with me in 1 month.

## 2017-04-18 NOTE — Assessment & Plan Note (Signed)
Independently reviewed radiographs and CT scan.  Pain primarily due to arthritis with effusion but some due to proximal IT band syndrome also.  Tylenol, diclofenac twice a day.  Reviewed some supplements that may help.  Cortisone injection an option - declined this and physical therapy for now.  Shown home exercises to do daily.  Heat/ice.  F/u in 1 month.

## 2017-04-18 NOTE — Progress Notes (Signed)
PCP and consultation requested by: Mosie Lukes, MD  Subjective:   HPI: Patient is a 71 y.o. female here for right hip pain.  Patient reports she's had about 2-3 weeks of right hip pain. She recalls falling onto this hip about 3 weeks ago but didn't hurt right after this. A few days later started to get pain anterior and deep right hip. Sometimes radiates down his right leg from the groin. Pain worse at nighttime and is throbbing. Feels like the hip comes out of joint. Pain is 5/10 level but up to 10/10 and sharp. She also has fibromyalgia and reports she's usually in pain but this is different. No numbness or tingling. No bowel/bladder dysfunction.  Past Medical History:  Diagnosis Date  . Allergy    allergic rhinitis  . Anemia    NOS  . Arthritis    osteoarthritis  . Asthma   . Degenerative disc disease, lumbar 12/13/2015  . Depression   . Depression with anxiety 01/19/2009   Qualifier: Diagnosis of  By: Redmond Pulling MD, Frann Rider    . Fibromyalgia   . GERD (gastroesophageal reflux disease)   . Hair loss 11/01/2016  . Headache(784.0)   . History of diverticulitis of colon   . Hx of colonic polyps   . Hyperglycemia 11/23/2014  . Hyperlipidemia   . Medicare annual wellness visit, subsequent 11/01/2016  . Migraine   . Mild cognitive impairment 04/29/2016  . Miscarriage   . Obesity, unspecified 10/26/2013  . Otitis, externa, infective 11/23/2014  . Thyroid disease    hypothyroidism  . Urinary incontinence     Current Outpatient Prescriptions on File Prior to Visit  Medication Sig Dispense Refill  . carisoprodol (SOMA) 350 MG tablet TAKE 1 TABLET BY MOUTH AT BEDTIME AS NEEDED FOR MUSCLE SPASM ALTERNATE WITH FLEXERIL 30 tablet 1  . clonazePAM (KLONOPIN) 1 MG tablet TAKE 1 TABLET BY MOUTH 3 TIMES A DAY 90 tablet 1  . cyclobenzaprine (FLEXERIL) 10 MG tablet Reported on 12/01/2015    . diclofenac (VOLTAREN) 75 MG EC tablet TAKE 1 TABLET BY MOUTH TWICE A DAY 60 tablet 1  .  DULoxetine (CYMBALTA) 60 MG capsule TAKE 1 CAPSULE (60 MG TOTAL) BY MOUTH DAILY. 90 capsule 1  . fluconazole (DIFLUCAN) 150 MG tablet TAKE 2 TABLETS (300 MG TOTAL) BY MOUTH ONCE A WEEK. 4 tablet 2  . gabapentin (NEURONTIN) 100 MG capsule Take 1 capsule (100 mg total) by mouth at bedtime. 30 capsule 0  . HYDROcodone-acetaminophen (NORCO/VICODIN) 5-325 MG tablet 2 tab po q 6 hours as needed severe pain 24 tablet 0  . levothyroxine (SYNTHROID, LEVOTHROID) 75 MCG tablet TAKE 1 TABLET (75 MCG TOTAL) BY MOUTH DAILY. 30 tablet 6  . ondansetron (ZOFRAN) 4 MG tablet Take 1 tablet (4 mg total) by mouth every 8 (eight) hours as needed for nausea. 30 tablet 0  . Tretinoin, Facial Wrinkles, 0.02 % CREA Apply pea sized amount amount to face once daily 40 g 1   No current facility-administered medications on file prior to visit.     Past Surgical History:  Procedure Laterality Date  . ABDOMINAL HYSTERECTOMY    . BLADDER SUSPENSION    . CHOLECYSTECTOMY    . TONSILLECTOMY      Allergies  Allergen Reactions  . Adhesive [Tape]     blisters  . Penicillins     REACTION: rash  . Promethazine Hcl Other (See Comments)    Jerky movements    Social History   Social History  .  Marital status: Divorced    Spouse name: N/A  . Number of children: N/A  . Years of education: N/A   Occupational History  . unemployed Retired   Social History Main Topics  . Smoking status: Never Smoker  . Smokeless tobacco: Never Used  . Alcohol use No  . Drug use: No  . Sexual activity: Not on file   Other Topics Concern  . Not on file   Social History Narrative  . No narrative on file    Family History  Problem Relation Age of Onset  . Alzheimer's disease Mother   . Alzheimer's disease Father   . Cancer Other        female, brain, lung  . Heart disease Other   . Stroke Other   . Diabetes Other   . Arthritis Other   . Hyperlipidemia Other   . Hypertension Other     BP 126/75   Pulse 73   Ht 5\' 7"   (1.702 m)   LMP 11/14/1978   Review of Systems: See HPI above.     Objective:  Physical Exam:  Gen: NAD, comfortable in exam room  Back/Right hip: No gross deformity, scoliosis. TTP mildly proximal IT band.  No midline or bony TTP. Mild limitation hip motion with pain.  FROM back without pain. Strength LEs 5/5 all muscle groups except 5-/5 hip flexion, 4/5 hip abduction   2+ MSRs in patellar and achilles tendons, equal bilaterally. Negative SLRs. Sensation intact to light touch bilaterally. Positive right hip logroll, negative left   Assessment & Plan:  1. Right hip pain - Independently reviewed radiographs and CT scan.  Pain primarily due to arthritis with effusion but some due to proximal IT band syndrome also.  Tylenol, diclofenac twice a day.  Reviewed some supplements that may help.  Cortisone injection an option - declined this and physical therapy for now.  Shown home exercises to do daily.  Heat/ice.  F/u in 1 month.

## 2017-04-21 ENCOUNTER — Other Ambulatory Visit: Payer: Self-pay | Admitting: Family Medicine

## 2017-04-21 MED ORDER — DICLOFENAC SODIUM 75 MG PO TBEC: 75.0000 mg | DELAYED_RELEASE_TABLET | Freq: Two times a day (BID) | ORAL | 0 refills | Status: DC

## 2017-04-25 ENCOUNTER — Telehealth: Payer: Self-pay | Admitting: Family Medicine

## 2017-04-25 ENCOUNTER — Telehealth: Payer: Self-pay | Admitting: Medical

## 2017-04-25 MED ORDER — TRETINOIN (FACIAL WRINKLES) 0.02 % EX CREA: TOPICAL_CREAM | CUTANEOUS | 1 refills | Status: DC

## 2017-04-25 NOTE — Telephone Encounter (Signed)
Per pt req call in Tretinoin to [facility] on [address] [phone]. Pt says on 04/13/17 edward said he was sending it to the pharmacy but they do not have it.

## 2017-04-25 NOTE — Telephone Encounter (Signed)
[facility] say ins requires 90 day supply. blyth wrote for #60 30 days supply no refills. Please send new script for 90 day supply.

## 2017-04-25 NOTE — Telephone Encounter (Signed)
Sent in

## 2017-05-01 MED ORDER — DICLOFENAC SODIUM 75 MG PO TBEC: 75.0000 mg | DELAYED_RELEASE_TABLET | Freq: Two times a day (BID) | ORAL | 0 refills | Status: DC

## 2017-05-01 NOTE — Addendum Note (Signed)
Addended by: Sharon Seller B on: 05/01/2017 10:55 AM   Modules accepted: Orders

## 2017-05-01 NOTE — Telephone Encounter (Signed)
90 days sent in

## 2017-05-04 ENCOUNTER — Other Ambulatory Visit: Payer: Self-pay | Admitting: Family Medicine

## 2017-05-04 NOTE — Telephone Encounter (Signed)
She cannot have a refill til seen it has been greater than 6 months since she was seen

## 2017-05-04 NOTE — Telephone Encounter (Signed)
Requesting:   clonazepam Contract    02/14/2017 UDS    Moderate----08/16/2017 Last OV    11/01/2016 Last Refill   #90 with 1 refill on 01/31/2017  Please Advise

## 2017-05-12 ENCOUNTER — Ambulatory Visit (INDEPENDENT_AMBULATORY_CARE_PROVIDER_SITE_OTHER): Payer: Medicare Other | Admitting: Medical

## 2017-05-12 ENCOUNTER — Other Ambulatory Visit: Payer: Self-pay | Admitting: Family Medicine

## 2017-05-12 ENCOUNTER — Other Ambulatory Visit: Payer: Self-pay | Admitting: Medical

## 2017-05-12 ENCOUNTER — Encounter: Payer: Self-pay | Admitting: Medical

## 2017-05-12 VITALS — BP 133/82 | HR 83 | Temp 98.9°F | Resp 16 | Ht 67.0 in | Wt 230.6 lb

## 2017-05-12 DIAGNOSIS — M25552 Pain in left hip: Secondary | ICD-10-CM

## 2017-05-12 DIAGNOSIS — G4700 Insomnia, unspecified: Secondary | ICD-10-CM | POA: Diagnosis not present

## 2017-05-12 DIAGNOSIS — M797 Fibromyalgia: Secondary | ICD-10-CM | POA: Diagnosis not present

## 2017-05-12 MED ORDER — HYDROCODONE-ACETAMINOPHEN 5-325 MG PO TABS: ORAL_TABLET | ORAL | 0 refills | Status: DC

## 2017-05-12 MED ORDER — GABAPENTIN 300 MG PO CAPS: 300.0000 mg | ORAL_CAPSULE | Freq: Every day | ORAL | 2 refills | Status: DC

## 2017-05-12 MED ORDER — ONDANSETRON HCL 4 MG PO TABS: 4.0000 mg | ORAL_TABLET | Freq: Three times a day (TID) | ORAL | 0 refills | Status: DC | PRN

## 2017-05-12 MED ORDER — TIZANIDINE HCL 2 MG PO TABS: 2.0000 mg | ORAL_TABLET | Freq: Every day | ORAL | 0 refills | Status: DC

## 2017-05-12 NOTE — Progress Notes (Signed)
In for urgent visit and with persistent hip pain and documented arthritis and effusion. Is allowed a refill on Hydrocodone to use sparingly but needs appt with PCP before more meds can be prescribed. Consider referral to pain management if pain persists

## 2017-05-12 NOTE — Patient Instructions (Addendum)
For your hip pain I would recommend continuing the diclofenac and if you change your mind on PT let us/or sports med.  I asked doctor Charlett Blake if she was ok with very limited number of norco and she agreed to 40 tabs. Any further refills she request you see her. But also she mentioned could refer to pain management.  For muscle spasms zanaflex. She did not authrorize soma.  For insomnia and fibromyalgia neurontin 300 mg q hs.  Regarding your wrinkle cream when you pick up pain meds would ask pharmacy if they have generic equivalent and if so is med covered.  Follow up as needed with Dr. Charlett Blake or as needed

## 2017-05-12 NOTE — Progress Notes (Signed)
Subjective:    Patient ID: Kayla [name], female    DOB: 1945/11/16, 71 y.o.   MRN: [id number]  HPI  Pt in for follow up.  She states the  Pain medicine helped a lot. Pt states the hydrocodone helped a lot and then eventually she saw Dr. Barbaraann Barthel. He recommended PT. Overall she states rt hip pain is a lot better. She also feels that diclofenac is helping.  Pt does not want to do PT.  Pt had some arthritis with effusion per Dr. Barbaraann Barthel note. Also she had some IT band syndrome.  Pt fibromyalgia pain interferes with her sleeping. I had written her neurontin 100 mg at night. Pt states this did not help her sleep. But she had been on neurontin 300 mg at night in the past and helped with insomnia and fibromyalgia per pt.  Pt states years ago former physician gave her oxycontin 5-6 years ago. She dc'd that in past without any problem per her report.  Pt states in past for muscle spasms in calfs she would use flexeril and soma in the past.   Review of Systems  Constitutional: Negative for chills, fatigue and fever.  Respiratory: Negative for cough, chest tightness and shortness of breath.   Cardiovascular: Negative for chest pain and palpitations.  Gastrointestinal: Negative for abdominal pain.  Musculoskeletal:       Fibromyalgia pain. Rt hip pain and calf spasm. See hpi.  Neurological: Negative for dizziness, speech difficulty and headaches.  Hematological: Negative for adenopathy. Does not bruise/bleed easily.  Psychiatric/Behavioral: Negative for behavioral problems and confusion.   Past Medical History:  Diagnosis Date  . Allergy    allergic rhinitis  . Anemia    NOS  . Arthritis    osteoarthritis  . Asthma   . Degenerative disc disease, lumbar 12/13/2015  . Depression   . Depression with anxiety 01/19/2009   Qualifier: Diagnosis of  By: Redmond Pulling MD, Frann Rider    . Fibromyalgia   . GERD (gastroesophageal reflux disease)   . Hair loss 11/01/2016  . Headache(784.0)   . History  of diverticulitis of colon   . Hx of colonic polyps   . Hyperglycemia 11/23/2014  . Hyperlipidemia   . Medicare annual wellness visit, subsequent 11/01/2016  . Migraine   . Mild cognitive impairment 04/29/2016  . Miscarriage   . Obesity, unspecified 10/26/2013  . Otitis, externa, infective 11/23/2014  . Thyroid disease    hypothyroidism  . Urinary incontinence      Social History   Social History  . Marital status: Divorced    Spouse name: N/A  . Number of children: N/A  . Years of education: N/A   Occupational History  . unemployed Retired   Social History Main Topics  . Smoking status: Never Smoker  . Smokeless tobacco: Never Used  . Alcohol use No  . Drug use: No  . Sexual activity: Not on file   Other Topics Concern  . Not on file   Social History Narrative  . No narrative on file    Past Surgical History:  Procedure Laterality Date  . ABDOMINAL HYSTERECTOMY    . BLADDER SUSPENSION    . CHOLECYSTECTOMY    . TONSILLECTOMY      Family History  Problem Relation Age of Onset  . Alzheimer's disease Mother   . Alzheimer's disease Father   . Cancer Other        female, brain, lung  . Heart disease Other   . Stroke  Other   . Diabetes Other   . Arthritis Other   . Hyperlipidemia Other   . Hypertension Other     Allergies  Allergen Reactions  . Adhesive [Tape]     blisters  . Penicillins     REACTION: rash  . Promethazine Hcl Other (See Comments)    Jerky movements    Current Outpatient Prescriptions on File Prior to Visit  Medication Sig Dispense Refill  . carisoprodol (SOMA) 350 MG tablet TAKE 1 TABLET BY MOUTH AT BEDTIME AS NEEDED FOR MUSCLE SPASM ALTERNATE WITH FLEXERIL 30 tablet 1  . clonazePAM (KLONOPIN) 1 MG tablet TAKE 1 TABLET BY MOUTH THREE TIMES A DAY 30 tablet 0  . cyclobenzaprine (FLEXERIL) 10 MG tablet Reported on 12/01/2015    . diclofenac (VOLTAREN) 75 MG EC tablet Take 1 tablet (75 mg total) by mouth 2 (two) times daily. 180 tablet 0   . DULoxetine (CYMBALTA) 60 MG capsule TAKE 1 CAPSULE (60 MG TOTAL) BY MOUTH DAILY. 90 capsule 1  . fluconazole (DIFLUCAN) 150 MG tablet TAKE 2 TABLETS (300 MG TOTAL) BY MOUTH ONCE A WEEK. 4 tablet 2  . gabapentin (NEURONTIN) 100 MG capsule Take 1 capsule (100 mg total) by mouth at bedtime. 30 capsule 0  . levothyroxine (SYNTHROID, LEVOTHROID) 75 MCG tablet TAKE 1 TABLET (75 MCG TOTAL) BY MOUTH DAILY. 30 tablet 6  . Tretinoin, Facial Wrinkles, 0.02 % CREA Apply pea sized amount amount to face once daily 40 g 1   No current facility-administered medications on file prior to visit.     BP 133/82 (BP Location: Left Arm, Patient Position: Sitting, Cuff Size: Normal)   Pulse 83   Temp 98.9 F (37.2 C) (Oral)   Resp 16   Ht 5\' 7"  (1.702 m)   Wt 230 lb 9.6 oz (104.6 kg)   LMP 11/14/1978   SpO2 99%   BMI 36.12 kg/m       Objective:   Physical Exam  General- No acute distress. Pleasant patient. Lungs- Clear, even and unlabored. Heart- regular rate and rhythm. Neurologic- CNII- XII grossly intact.  Rt hip- still some pain on rom but much less now than before       Assessment & Plan:  For your hip pain I would recommend continuing the diclofenac and if you change your mind on PT let us/or sports med.  I asked doctor Charlett Blake if she was ok with very limited number of norco and she agreed to 40 tabs. Any further refills she request you see her. But also she mentioned could refer to pain management.  For muscle spasms zanaflex. She did not authrorize soma.  For insomnia and fibromyalgia neurontin 300 mg q hs.  Regarding your wrinkle cream when you pick up pain meds would ask pharmacy if they have generic equivalent and if so is med covered.  Follow up as needed with Dr. Charlett Blake or as needed

## 2017-05-15 ENCOUNTER — Other Ambulatory Visit: Payer: Self-pay | Admitting: Medical

## 2017-05-18 ENCOUNTER — Other Ambulatory Visit: Payer: Self-pay | Admitting: Family Medicine

## 2017-05-30 ENCOUNTER — Encounter (INDEPENDENT_AMBULATORY_CARE_PROVIDER_SITE_OTHER): Payer: Self-pay

## 2017-05-30 ENCOUNTER — Ambulatory Visit (INDEPENDENT_AMBULATORY_CARE_PROVIDER_SITE_OTHER): Payer: Medicare Other | Admitting: Family Medicine

## 2017-05-30 VITALS — BP 116/76 | HR 80 | Temp 98.5°F | Resp 18 | Wt 299.8 lb

## 2017-05-30 DIAGNOSIS — E6609 Other obesity due to excess calories: Secondary | ICD-10-CM | POA: Diagnosis not present

## 2017-05-30 DIAGNOSIS — E039 Hypothyroidism, unspecified: Secondary | ICD-10-CM | POA: Diagnosis not present

## 2017-05-30 DIAGNOSIS — E782 Mixed hyperlipidemia: Secondary | ICD-10-CM | POA: Diagnosis not present

## 2017-05-30 DIAGNOSIS — G4700 Insomnia, unspecified: Secondary | ICD-10-CM

## 2017-05-30 DIAGNOSIS — F418 Other specified anxiety disorders: Secondary | ICD-10-CM | POA: Diagnosis not present

## 2017-05-30 DIAGNOSIS — Z7251 High risk heterosexual behavior: Secondary | ICD-10-CM | POA: Diagnosis not present

## 2017-05-30 DIAGNOSIS — Z7289 Other problems related to lifestyle: Secondary | ICD-10-CM

## 2017-05-30 DIAGNOSIS — Z1239 Encounter for other screening for malignant neoplasm of breast: Secondary | ICD-10-CM

## 2017-05-30 DIAGNOSIS — Z1231 Encounter for screening mammogram for malignant neoplasm of breast: Secondary | ICD-10-CM | POA: Diagnosis not present

## 2017-05-30 DIAGNOSIS — G894 Chronic pain syndrome: Secondary | ICD-10-CM

## 2017-05-30 DIAGNOSIS — R739 Hyperglycemia, unspecified: Secondary | ICD-10-CM | POA: Diagnosis not present

## 2017-05-30 DIAGNOSIS — K759 Inflammatory liver disease, unspecified: Secondary | ICD-10-CM | POA: Diagnosis not present

## 2017-05-30 MED ORDER — AMITRIPTYLINE HCL 25 MG PO TABS: 25.0000 mg | ORAL_TABLET | Freq: Every day | ORAL | 2 refills | Status: DC

## 2017-05-30 MED ORDER — TRETINOIN (FACIAL WRINKLES) 0.02 % EX CREA: TOPICAL_CREAM | CUTANEOUS | 1 refills | Status: DC

## 2017-05-30 MED ORDER — GABAPENTIN 800 MG PO TABS: 800.0000 mg | ORAL_TABLET | Freq: Three times a day (TID) | ORAL | 3 refills | Status: DC

## 2017-05-30 MED ORDER — TRETINOIN MICROSPHERE 0.1 % EX GEL: Freq: Every day | CUTANEOUS | 1 refills | Status: DC

## 2017-05-30 MED ORDER — TIZANIDINE HCL 2 MG PO TABS: 2.0000 mg | ORAL_TABLET | Freq: Two times a day (BID) | ORAL | Status: DC | PRN

## 2017-05-30 NOTE — Progress Notes (Signed)
Subjective:  I acted as a Education administrator for Dr. Charlett Blake. [place], [place]  Patient ID: Kayla [name], female    DOB: 04/23/1946, 71 y.o.   MRN: [id number]  No chief complaint on file.   HPI  Patient is in today for follow up. She is struggling with a great deal of stress and is asking for a referral back to psychiatry due to her worsening depression. Notes anhedonia but no suicidal ideation. No recent febrile illness or hospitalizations. Continues to struggle with back pain and myalgias each day. Denies CP/palp/SOB/HA/congestion/fevers/GI or GU c/o. Taking meds as prescribed  Patient Care Team: Mosie Lukes, MD as PCP - General (Family Medicine)   Past Medical History:  Diagnosis Date  . Allergy    allergic rhinitis  . Anemia    NOS  . Arthritis    osteoarthritis  . Asthma   . Degenerative disc disease, lumbar 12/13/2015  . Depression   . Depression with anxiety 01/19/2009   Qualifier: Diagnosis of  By: Redmond Pulling MD, Frann Rider    . Fibromyalgia   . GERD (gastroesophageal reflux disease)   . Hair loss 11/01/2016  . Headache(784.0)   . History of diverticulitis of colon   . Hx of colonic polyps   . Hyperglycemia 11/23/2014  . Hyperlipidemia   . Medicare annual wellness visit, subsequent 11/01/2016  . Migraine   . Mild cognitive impairment 04/29/2016  . Miscarriage   . Obesity, unspecified 10/26/2013  . Otitis, externa, infective 11/23/2014  . Thyroid disease    hypothyroidism  . Urinary incontinence     Past Surgical History:  Procedure Laterality Date  . ABDOMINAL HYSTERECTOMY    . BLADDER SUSPENSION    . CHOLECYSTECTOMY    . TONSILLECTOMY      Family History  Problem Relation Age of Onset  . Alzheimer's disease Mother   . Alzheimer's disease Father   . Cancer Other        female, brain, lung  . Heart disease Other   . Stroke Other   . Diabetes Other   . Arthritis Other   . Hyperlipidemia Other   . Hypertension Other     Social History   Social History  . Marital  status: Divorced    Spouse name: N/A  . Number of children: N/A  . Years of education: N/A   Occupational History  . unemployed Retired   Social History Main Topics  . Smoking status: Never Smoker  . Smokeless tobacco: Never Used  . Alcohol use No  . Drug use: No  . Sexual activity: Not on file   Other Topics Concern  . Not on file   Social History Narrative  . No narrative on file    Outpatient Medications Prior to Visit  Medication Sig Dispense Refill  . clonazePAM (KLONOPIN) 1 MG tablet TAKE 1 TABLET BY MOUTH THREE TIMES A DAY 30 tablet 0  . diclofenac (VOLTAREN) 75 MG EC tablet Take 1 tablet (75 mg total) by mouth 2 (two) times daily. 180 tablet 0  . DULoxetine (CYMBALTA) 60 MG capsule TAKE 1 CAPSULE (60 MG TOTAL) BY MOUTH DAILY. 90 capsule 1  . fluconazole (DIFLUCAN) 150 MG tablet TAKE 2 TABLETS (300 MG TOTAL) BY MOUTH ONCE A WEEK. 4 tablet 2  . HYDROcodone-acetaminophen (NORCO/VICODIN) 5-325 MG tablet 2 tab po q 6 hours as needed severe pain 40 tablet 0  . levothyroxine (SYNTHROID, LEVOTHROID) 75 MCG tablet TAKE 1 TABLET (75 MCG TOTAL) BY MOUTH DAILY. 30 tablet 1  .  ondansetron (ZOFRAN) 4 MG tablet Take 1 tablet (4 mg total) by mouth every 8 (eight) hours as needed for nausea. 30 tablet 0  . gabapentin (NEURONTIN) 300 MG capsule Take 1 capsule (300 mg total) by mouth at bedtime. 30 capsule 2  . tiZANidine (ZANAFLEX) 2 MG tablet Take 1 tablet (2 mg total) by mouth at bedtime. 30 tablet 0  . Tretinoin, Facial Wrinkles, 0.02 % CREA Apply pea sized amount amount to face once daily 40 g 1  . carisoprodol (SOMA) 350 MG tablet TAKE 1 TABLET BY MOUTH AT BEDTIME AS NEEDED FOR MUSCLE SPASM ALTERNATE WITH FLEXERIL 30 tablet 1  . cyclobenzaprine (FLEXERIL) 10 MG tablet Reported on 12/01/2015    . gabapentin (NEURONTIN) 100 MG capsule Take 1 capsule (100 mg total) by mouth at bedtime. 30 capsule 0   No facility-administered medications prior to visit.     Allergies  Allergen  Reactions  . Adhesive [Tape]     blisters  . Penicillins     REACTION: rash  . Promethazine Hcl Other (See Comments)    Jerky movements    Review of Systems  Constitutional: Positive for malaise/fatigue. Negative for fever.  HENT: Negative for congestion.   Eyes: Negative for blurred vision.  Respiratory: Negative for shortness of breath.   Cardiovascular: Negative for chest pain, palpitations and leg swelling.  Gastrointestinal: Negative for abdominal pain, blood in stool and nausea.  Genitourinary: Negative for dysuria and frequency.  Musculoskeletal: Positive for back pain, joint pain and myalgias. Negative for falls.  Skin: Negative for rash.  Neurological: Negative for dizziness, loss of consciousness and headaches.  Endo/Heme/Allergies: Negative for environmental allergies.  Psychiatric/Behavioral: Positive for depression. The patient is nervous/anxious and has insomnia.        Objective:    Physical Exam  Constitutional: She is oriented to person, place, and time. She appears well-developed and well-nourished. No distress.  HENT:  Head: Normocephalic and atraumatic.  Nose: Nose normal.  Eyes: Right eye exhibits no discharge. Left eye exhibits no discharge.  Neck: Normal range of motion. Neck supple.  Cardiovascular: Normal rate and regular rhythm.   No murmur heard. Pulmonary/Chest: Effort normal and breath sounds normal.  Abdominal: Soft. Bowel sounds are normal. There is no tenderness.  Musculoskeletal: She exhibits no edema.  Neurological: She is alert and oriented to person, place, and time.  Skin: Skin is warm and dry.  Psychiatric: She has a normal mood and affect.  Nursing note and vitals reviewed.   BP 116/76 (BP Location: Left Arm, Patient Position: Sitting, Cuff Size: Normal)   Pulse 80   Temp 98.5 F (36.9 C) (Oral)   Resp 18   Wt 299 lb 12.8 oz (136 kg)   LMP 11/14/1978   SpO2 98%   BMI 46.96 kg/m  Wt Readings from Last 3 Encounters:    05/30/17 299 lb 12.8 oz (136 kg)  05/12/17 230 lb 9.6 oz (104.6 kg)  04/13/17 234 lb 9.6 oz (106.4 kg)   BP Readings from Last 3 Encounters:  05/30/17 116/76  05/12/17 133/82  04/17/17 126/75     Immunization History  Administered Date(s) Administered  . Influenza Split 09/13/2011  . Influenza Whole 08/31/2010  . Influenza,inj,Quad PF,36+ Mos 07/25/2013  . Pneumococcal Conjugate-13 10/25/2013  . Tdap 10/25/2013    Health Maintenance  Topic Date Due  . Hepatitis C Screening  05-Jan-1946  . DEXA SCAN  06/04/2011  . MAMMOGRAM  04/10/2016  . PNA vac Low Risk Adult (2 of  2 - PPSV23) 07/28/2017 (Originally 10/25/2014)  . INFLUENZA VACCINE  06/14/2017  . COLONOSCOPY  02/23/2022  . TETANUS/TDAP  10/26/2023    Lab Results  Component Value Date   WBC 11.1 (H) 11/01/2016   HGB 14.6 11/01/2016   HCT 44.2 11/01/2016   PLT 343.0 11/01/2016   GLUCOSE 82 11/01/2016   CHOL 266 (H) 11/01/2016   TRIG 93.0 11/01/2016   HDL 66.90 11/01/2016   LDLCALC 180 (H) 11/01/2016   ALT 19 11/01/2016   AST 16 11/01/2016   NA 140 11/01/2016   K 5.0 11/01/2016   CL 103 11/01/2016   CREATININE 0.86 11/01/2016   BUN 13 11/01/2016   CO2 27 11/01/2016   TSH 0.46 11/01/2016   HGBA1C 5.6 11/01/2016    Lab Results  Component Value Date   TSH 0.46 11/01/2016   Lab Results  Component Value Date   WBC 11.1 (H) 11/01/2016   HGB 14.6 11/01/2016   HCT 44.2 11/01/2016   MCV 85.7 11/01/2016   PLT 343.0 11/01/2016   Lab Results  Component Value Date   NA 140 11/01/2016   K 5.0 11/01/2016   CO2 27 11/01/2016   GLUCOSE 82 11/01/2016   BUN 13 11/01/2016   CREATININE 0.86 11/01/2016   BILITOT 0.6 11/01/2016   ALKPHOS 85 11/01/2016   AST 16 11/01/2016   ALT 19 11/01/2016   PROT 7.4 11/01/2016   ALBUMIN 4.2 11/01/2016   CALCIUM 9.6 11/01/2016   GFR 69.25 11/01/2016   Lab Results  Component Value Date   CHOL 266 (H) 11/01/2016   Lab Results  Component Value Date   HDL 66.90 11/01/2016    Lab Results  Component Value Date   LDLCALC 180 (H) 11/01/2016   Lab Results  Component Value Date   TRIG 93.0 11/01/2016   Lab Results  Component Value Date   CHOLHDL 4 11/01/2016   Lab Results  Component Value Date   HGBA1C 5.6 11/01/2016         Assessment & Plan:   Problem List Items Addressed This Visit    Obesity    Encouraged DASH diet, decrease po intake and increase exercise as tolerated. Needs 7-8 hours of sleep nightly. Avoid trans fats, eat small, frequent meals every 4-5 hours with lean proteins, complex carbs and healthy fats. Minimize simple carbs      Hypothyroidism    On Levothyroxine, continue to monitor      Relevant Orders   TSH   Hyperlipidemia    Encouraged heart healthy diet, increase exercise, avoid trans fats, consider a krill oil cap daily      Relevant Orders   CBC   Comprehensive metabolic panel   Lipid panel   Depression with anxiety - Primary    Referred to psychiatry at her request no change in meds.      Relevant Orders   Ambulatory referral to Psychiatry   Chronic pain syndrome    Increase Gabapentin 800 mg tabs tid      Hyperglycemia    minimize simple carbs. Increase exercise as tolerated.       Relevant Orders   CBC   Comprehensive metabolic panel   Lipid panel   Insomnia    Not sleeping well will try Elavil 25 mg qhs while waiting on psychiatry      Breast cancer screening   Relevant Orders   MM Digital Screening    Other Visit Diagnoses    High risk sexual behavior  Other problems related to lifestyle       Hepatitis       Relevant Orders   Hepatitis, Acute      I have discontinued Ms. Real's cyclobenzaprine, carisoprodol, gabapentin, Tretinoin (Facial Wrinkles), gabapentin, and Tretinoin (Facial Wrinkles). I have also changed her tiZANidine. Additionally, I am having her start on gabapentin, amitriptyline, and tretinoin microspheres. Lastly, I am having her maintain her fluconazole,  DULoxetine, diclofenac, clonazePAM, ondansetron, HYDROcodone-acetaminophen, and levothyroxine.  Meds ordered this encounter  Medications  . tiZANidine (ZANAFLEX) 2 MG tablet    Sig: Take 1 tablet (2 mg total) by mouth 2 (two) times daily as needed for muscle spasms.    Dispense:  60 tablet    Refill:  03  . gabapentin (NEURONTIN) 800 MG tablet    Sig: Take 1 tablet (800 mg total) by mouth 3 (three) times daily.    Dispense:  90 tablet    Refill:  3  . DISCONTD: Tretinoin, Facial Wrinkles, 0.02 % CREA    Sig: Apply pea sized amount amount to face once daily    Dispense:  40 g    Refill:  1  . amitriptyline (ELAVIL) 25 MG tablet    Sig: Take 1 tablet (25 mg total) by mouth at bedtime.    Dispense:  30 tablet    Refill:  2  . tretinoin microspheres (RETIN-A MICRO) 0.1 % gel    Sig: Apply topically at bedtime.    Dispense:  45 g    Refill:  1    CMA served as scribe during this visit. History, Physical and Plan performed by medical provider. Documentation and orders reviewed and attested to.  Penni Homans, MD

## 2017-05-30 NOTE — Assessment & Plan Note (Signed)
Increase Gabapentin 800 mg tabs tid

## 2017-05-30 NOTE — Patient Instructions (Signed)
Encouraged good sleep hygiene such as dark, quiet room. No blue/green glowing lights such as computer screens in bedroom. No alcohol or stimulants in evening. Cut down on caffeine as able. Regular exercise is helpful but not just prior to bed time.  Melatonin 2-10 mg at bed or L tryptophan capsule or Valerian root  Insomnia Insomnia is a sleep disorder that makes it difficult to fall asleep or to stay asleep. Insomnia can cause tiredness (fatigue), low energy, difficulty concentrating, mood swings, and poor performance at work or school. There are three different ways to classify insomnia:  Difficulty falling asleep.  Difficulty staying asleep.  Waking up too early in the morning.  Any type of insomnia can be long-term (chronic) or short-term (acute). Both are common. Short-term insomnia usually lasts for three months or less. Chronic insomnia occurs at least three times a week for longer than three months. What are the causes? Insomnia may be caused by another condition, situation, or substance, such as:  Anxiety.  Certain medicines.  Gastroesophageal reflux disease (GERD) or other gastrointestinal conditions.  Asthma or other breathing conditions.  Restless legs syndrome, sleep apnea, or other sleep disorders.  Chronic pain.  Menopause. This may include hot flashes.  Stroke.  Abuse of alcohol, tobacco, or illegal drugs.  Depression.  Caffeine.  Neurological disorders, such as Alzheimer disease.  An overactive thyroid (hyperthyroidism).  The cause of insomnia may not be known. What increases the risk? Risk factors for insomnia include:  Gender. Women are more commonly affected than men.  Age. Insomnia is more common as you get older.  Stress. This may involve your professional or personal life.  Income. Insomnia is more common in people with lower income.  Lack of exercise.  Irregular work schedule or night shifts.  Traveling between different time  zones.  What are the signs or symptoms? If you have insomnia, trouble falling asleep or trouble staying asleep is the main symptom. This may lead to other symptoms, such as:  Feeling fatigued.  Feeling nervous about going to sleep.  Not feeling rested in the morning.  Having trouble concentrating.  Feeling irritable, anxious, or depressed.  How is this treated? Treatment for insomnia depends on the cause. If your insomnia is caused by an underlying condition, treatment will focus on addressing the condition. Treatment may also include:  Medicines to help you sleep.  Counseling or therapy.  Lifestyle adjustments.  Follow these instructions at home:  Take medicines only as directed by your health care provider.  Keep regular sleeping and waking hours. Avoid naps.  Keep a sleep diary to help you and your health care provider figure out what could be causing your insomnia. Include: ? When you sleep. ? When you wake up during the night. ? How well you sleep. ? How rested you feel the next day. ? Any side effects of medicines you are taking. ? What you eat and drink.  Make your bedroom a comfortable place where it is easy to fall asleep: ? Put up shades or special blackout curtains to block light from outside. ? Use a white noise machine to block noise. ? Keep the temperature cool.  Exercise regularly as directed by your health care provider. Avoid exercising right before bedtime.  Use relaxation techniques to manage stress. Ask your health care provider to suggest some techniques that may work well for you. These may include: ? Breathing exercises. ? Routines to release muscle tension. ? Visualizing peaceful scenes.  Cut back on alcohol,  caffeinated beverages, and cigarettes, especially close to bedtime. These can disrupt your sleep.  Do not overeat or eat spicy foods right before bedtime. This can lead to digestive discomfort that can make it hard for you to  sleep.  Limit screen use before bedtime. This includes: ? Watching TV. ? Using your smartphone, tablet, and computer.  Stick to a routine. This can help you fall asleep faster. Try to do a quiet activity, brush your teeth, and go to bed at the same time each night.  Get out of bed if you are still awake after 15 minutes of trying to sleep. Keep the lights down, but try reading or doing a quiet activity. When you feel sleepy, go back to bed.  Make sure that you drive carefully. Avoid driving if you feel very sleepy.  Keep all follow-up appointments as directed by your health care provider. This is important. Contact a health care provider if:  You are tired throughout the day or have trouble in your daily routine due to sleepiness.  You continue to have sleep problems or your sleep problems get worse. Get help right away if:  You have serious thoughts about hurting yourself or someone else. This information is not intended to replace advice given to you by your health care provider. Make sure you discuss any questions you have with your health care provider. Document Released: 10/28/2000 Document Revised: 04/01/2016 Document Reviewed: 08/01/2014 Elsevier Interactive Patient Education  [employer].

## 2017-05-31 ENCOUNTER — Telehealth: Payer: Self-pay

## 2017-05-31 NOTE — Assessment & Plan Note (Signed)
Not sleeping well will try Elavil 25 mg qhs while waiting on psychiatry

## 2017-05-31 NOTE — Assessment & Plan Note (Signed)
Encouraged DASH diet, decrease po intake and increase exercise as tolerated. Needs 7-8 hours of sleep nightly. Avoid trans fats, eat small, frequent meals every 4-5 hours with lean proteins, complex carbs and healthy fats. Minimize simple carbs 

## 2017-05-31 NOTE — Assessment & Plan Note (Signed)
On Levothyroxine, continue to monitor 

## 2017-05-31 NOTE — Assessment & Plan Note (Signed)
Referred to psychiatry at her request no change in meds.

## 2017-05-31 NOTE — Assessment & Plan Note (Signed)
minimize simple carbs. Increase exercise as tolerated.  

## 2017-05-31 NOTE — Assessment & Plan Note (Signed)
Encouraged heart healthy diet, increase exercise, avoid trans fats, consider a krill oil cap daily 

## 2017-05-31 NOTE — Telephone Encounter (Signed)
PA initiated via Covermymeds; KEY: [id number]. Received real time PA denial. Drugs used for hair growth, athletic performance, cosmetic purposes or anti-aging are excluded under Medicare rules.

## 2017-06-07 ENCOUNTER — Other Ambulatory Visit: Payer: Self-pay | Admitting: Family Medicine

## 2017-06-08 ENCOUNTER — Other Ambulatory Visit: Payer: Self-pay | Admitting: Medical

## 2017-06-14 ENCOUNTER — Encounter: Payer: Self-pay | Admitting: Medical

## 2017-06-14 ENCOUNTER — Ambulatory Visit (INDEPENDENT_AMBULATORY_CARE_PROVIDER_SITE_OTHER): Payer: Medicare Other | Admitting: Medical

## 2017-06-14 VITALS — BP 130/57 | HR 88 | Temp 98.9°F | Resp 16 | Ht 67.0 in | Wt 229.8 lb

## 2017-06-14 DIAGNOSIS — F419 Anxiety disorder, unspecified: Secondary | ICD-10-CM

## 2017-06-14 DIAGNOSIS — R0683 Snoring: Secondary | ICD-10-CM | POA: Diagnosis not present

## 2017-06-14 DIAGNOSIS — M25511 Pain in right shoulder: Secondary | ICD-10-CM | POA: Diagnosis not present

## 2017-06-14 MED ORDER — CLONAZEPAM 0.5 MG PO TABS: 0.5000 mg | ORAL_TABLET | Freq: Three times a day (TID) | ORAL | 0 refills | Status: DC | PRN

## 2017-06-14 MED ORDER — MELOXICAM 7.5 MG PO TABS: ORAL_TABLET | ORAL | 0 refills | Status: DC

## 2017-06-14 NOTE — Patient Instructions (Addendum)
For your rt shoulder pain offered ortho clinic to evaluate rotator cuff. You declined. Will rx meloxicam in place of diclofenac. Will see if this helps.(stop diclofenac)  For snoring will refer you for sleep study/evaluation.  For anxiety refilling our clonazepam.  To set up mammogram [phone].  Follow up in 3-4 weeks or as neeed

## 2017-06-14 NOTE — Progress Notes (Signed)
Subjective:    Patient ID: Kayla [name], female    DOB: 12/11/1945, 71 y.o.   MRN: [id number]  HPI  Pt in for some rt arm discomfort.   Pt gives history of remote shoulder injury after falling in hall against wall around january. Pt states pain with certain movements of shoulder. Pain when she attempt abduct her arm or do simple thinks like open jars or opened up food boxes will cause pain.  Pt also feels like small lumps in back of her rt arm. She hx of fibromyalgia.  Pt in past given diclofenac for pain in past. She states 75 mg is not strong enough. She uses in past for various pain and fibromyalia per pt.  Pt also needs refills of her clonazepam. Last filled by Dr. Charlett Blake on 05-05-2017. 30 tab rx has last her until just recently.   Pt states she thinks may snore. One son states she does others states she does. Pt states she does not feel well rested in am. Pt years ago had sleep study + for sleep apnea. Pt had deviated septum repair and pharynx surgery. For a while felt better after surgery but not so now.  Pt needs number to breast clinic. Mammogram ordered she just needs number to schedule appointment.     Review of Systems  Constitutional: Negative for chills, fatigue and fever.  Respiratory: Negative for cough, choking, chest tightness, shortness of breath and wheezing.   Gastrointestinal: Negative for abdominal pain, blood in stool, nausea and vomiting.       Some hx of light colored stools.   Genitourinary: Negative for difficulty urinating, dysuria, flank pain and frequency.  Musculoskeletal: Negative for back pain.       Rt shoulder pain. Upper arm pain.  Skin: Negative for rash.  Neurological: Negative for dizziness, seizures, syncope, speech difficulty, weakness, numbness and headaches.       Fibromyalgia.  Hematological: Negative for adenopathy. Does not bruise/bleed easily.  Psychiatric/Behavioral: Negative for behavioral problems, confusion, dysphoric mood and  suicidal ideas. The patient is nervous/anxious.    Past Medical History:  Diagnosis Date  . Allergy    allergic rhinitis  . Anemia    NOS  . Arthritis    osteoarthritis  . Asthma   . Degenerative disc disease, lumbar 12/13/2015  . Depression   . Depression with anxiety 01/19/2009   Qualifier: Diagnosis of  By: Redmond Pulling MD, Frann Rider    . Fibromyalgia   . GERD (gastroesophageal reflux disease)   . Hair loss 11/01/2016  . Headache(784.0)   . History of diverticulitis of colon   . Hx of colonic polyps   . Hyperglycemia 11/23/2014  . Hyperlipidemia   . Medicare annual wellness visit, subsequent 11/01/2016  . Migraine   . Mild cognitive impairment 04/29/2016  . Miscarriage   . Obesity, unspecified 10/26/2013  . Otitis, externa, infective 11/23/2014  . Thyroid disease    hypothyroidism  . Urinary incontinence      Social History   Social History  . Marital status: Divorced    Spouse name: N/A  . Number of children: N/A  . Years of education: N/A   Occupational History  . unemployed Retired   Social History Main Topics  . Smoking status: Never Smoker  . Smokeless tobacco: Never Used  . Alcohol use No  . Drug use: No  . Sexual activity: Not on file   Other Topics Concern  . Not on file   Social History Narrative  .  No narrative on file    Past Surgical History:  Procedure Laterality Date  . ABDOMINAL HYSTERECTOMY    . BLADDER SUSPENSION    . CHOLECYSTECTOMY    . TONSILLECTOMY      Family History  Problem Relation Age of Onset  . Alzheimer's disease Mother   . Alzheimer's disease Father   . Cancer Other        female, brain, lung  . Heart disease Other   . Stroke Other   . Diabetes Other   . Arthritis Other   . Hyperlipidemia Other   . Hypertension Other     Allergies  Allergen Reactions  . Adhesive [Tape]     blisters  . Penicillins     REACTION: rash  . Promethazine Hcl Other (See Comments)    Jerky movements    Current Outpatient  Prescriptions on File Prior to Visit  Medication Sig Dispense Refill  . amitriptyline (ELAVIL) 25 MG tablet Take 1 tablet (25 mg total) by mouth at bedtime. 30 tablet 2  . clonazePAM (KLONOPIN) 1 MG tablet TAKE 1 TABLET BY MOUTH THREE TIMES A DAY 30 tablet 0  . diclofenac (VOLTAREN) 75 MG EC tablet Take 1 tablet (75 mg total) by mouth 2 (two) times daily. 180 tablet 0  . DULoxetine (CYMBALTA) 60 MG capsule TAKE 1 CAPSULE (60 MG TOTAL) BY MOUTH DAILY. 90 capsule 1  . fluconazole (DIFLUCAN) 150 MG tablet TAKE 2 TABLETS (300 MG TOTAL) BY MOUTH ONCE A WEEK. 4 tablet 2  . gabapentin (NEURONTIN) 800 MG tablet Take 1 tablet (800 mg total) by mouth 3 (three) times daily. 90 tablet 3  . HYDROcodone-acetaminophen (NORCO/VICODIN) 5-325 MG tablet 2 tab po q 6 hours as needed severe pain 40 tablet 0  . levothyroxine (SYNTHROID, LEVOTHROID) 75 MCG tablet TAKE 1 TABLET (75 MCG TOTAL) BY MOUTH DAILY. 30 tablet 1  . ondansetron (ZOFRAN) 4 MG tablet Take 1 tablet (4 mg total) by mouth every 8 (eight) hours as needed for nausea. 30 tablet 0  . tiZANidine (ZANAFLEX) 2 MG tablet Take 1 tablet (2 mg total) by mouth 2 (two) times daily as needed for muscle spasms. 60 tablet 03  . tiZANidine (ZANAFLEX) 2 MG tablet TAKE 1 TABLET (2 MG TOTAL) BY MOUTH AT BEDTIME. 30 tablet 0  . tretinoin microspheres (RETIN-A MICRO) 0.1 % gel Apply topically at bedtime. 45 g 1   No current facility-administered medications on file prior to visit.     BP (!) 130/57   Pulse 88   Temp 98.9 F (37.2 C) (Oral)   Resp 16   Ht 5\' 7"  (1.702 m)   Wt 229 lb 12.8 oz (104.2 kg)   LMP 11/14/1978   SpO2 98%   BMI 35.99 kg/m       Objective:   Physical Exam  General Mental Status- Alert. General Appearance- Not in acute distress.   Skin General: Color- Normal Color. Moisture- Normal Moisture.  Neck Carotid Arteries- Normal color. Moisture- Normal Moisture. No carotid bruits. No JVD.  Chest and Lung Exam Auscultation: Breath  Sounds:-Normal.  Cardiovascular Auscultation:Rythm- Regular. Murmurs & Other Heart Sounds:Auscultation of the heart reveals- No Murmurs.  Abdomen Inspection:-Inspeection Normal. Palpation/Percussion:Note:No mass. Palpation and Percussion of the abdomen reveal- Non Tender, Non Distended + BS, no rebound or guarding.  Neurologic Cranial Nerve exam:- CN III-XII intact(No nystagmus), symmetric smile. Strength:- 5/5 equal and symmetric strength both upper and lower extremities.  Rt shoulder- pain on abduction of rt arm. No warmth.  No rash.  Rt arm- on palpation rt tricep area no masses, no lesions, no warmth and no severe tenderness.       Assessment & Plan:  For your rt shoulder pain offered ortho clinic to evaluate rotator cuff. You declined. Will rx meloxicam in place of diclofenac. Will see if this helps.  For snoring will refer you for sleep study/evaluation.  For anxiety refilling our clonazepam.  To set up mammogram [phone].  Follow up in 3-4 weeks or as neeed

## 2017-06-23 ENCOUNTER — Ambulatory Visit
Admission: RE | Admit: 2017-06-23 | Discharge: 2017-06-23 | Disposition: A | Discharge status: Home / Self Care | Payer: Medicare Other | Source: Ambulatory Visit | Attending: Family Medicine | Admitting: Family Medicine

## 2017-06-23 DIAGNOSIS — Z1239 Encounter for other screening for malignant neoplasm of breast: Secondary | ICD-10-CM

## 2017-06-23 DIAGNOSIS — Z1231 Encounter for screening mammogram for malignant neoplasm of breast: Secondary | ICD-10-CM | POA: Diagnosis not present

## 2017-06-28 ENCOUNTER — Telehealth: Payer: Self-pay | Admitting: Medical

## 2017-06-28 ENCOUNTER — Encounter: Payer: Self-pay | Admitting: Medical

## 2017-06-28 ENCOUNTER — Ambulatory Visit (INDEPENDENT_AMBULATORY_CARE_PROVIDER_SITE_OTHER): Payer: Medicare Other | Admitting: Medical

## 2017-06-28 VITALS — BP 129/84 | HR 74 | Temp 98.1°F | Resp 16 | Ht 67.0 in | Wt 230.0 lb

## 2017-06-28 DIAGNOSIS — F419 Anxiety disorder, unspecified: Secondary | ICD-10-CM

## 2017-06-28 MED ORDER — CLONAZEPAM 1 MG PO TABS: ORAL_TABLET | ORAL | 0 refills | Status: DC

## 2017-06-28 NOTE — Progress Notes (Signed)
Subjective:    Patient ID: Kayla [name], female    DOB: May 14, 1946, 71 y.o.   MRN: [id number]  HPI   Pt in for some anxiety. She wants to be back on clonazepam. She stopped elavil prescribed by Dr. Charlett Blake. She states she does not want to be on. She state elavil made her feel "icky".  I had written her clonazepam on last visit but short course. Pt wants to be on 1 mg 3 times daily. She had been on this in the past.  She has a lot of financial stress.   Pt ran out of her clonazepam just recently.   Review of Systems  Constitutional: Negative for chills and fatigue.  Respiratory: Negative for cough, chest tightness and shortness of breath.   Cardiovascular: Negative for chest pain and palpitations.  Gastrointestinal: Negative for abdominal pain.  Musculoskeletal: Negative for back pain, myalgias and neck pain.  Neurological: Negative for dizziness, speech difficulty, weakness, light-headedness and numbness.  Psychiatric/Behavioral: Positive for dysphoric mood. Negative for agitation, behavioral problems and suicidal ideas. The patient is nervous/anxious.        Describes frustration about her husband leaving her. She is stressed out about recenlty moving.   Past Medical History:  Diagnosis Date  . Allergy    allergic rhinitis  . Anemia    NOS  . Arthritis    osteoarthritis  . Asthma   . Degenerative disc disease, lumbar 12/13/2015  . Depression   . Depression with anxiety 01/19/2009   Qualifier: Diagnosis of  By: Redmond Pulling MD, Frann Rider    . Fibromyalgia   . GERD (gastroesophageal reflux disease)   . Hair loss 11/01/2016  . Headache(784.0)   . History of diverticulitis of colon   . Hx of colonic polyps   . Hyperglycemia 11/23/2014  . Hyperlipidemia   . Medicare annual wellness visit, subsequent 11/01/2016  . Migraine   . Mild cognitive impairment 04/29/2016  . Miscarriage   . Obesity, unspecified 10/26/2013  . Otitis, externa, infective 11/23/2014  . Thyroid disease    hypothyroidism  . Urinary incontinence      Social History   Social History  . Marital status: Divorced    Spouse name: N/A  . Number of children: N/A  . Years of education: N/A   Occupational History  . unemployed Retired   Social History Main Topics  . Smoking status: Never Smoker  . Smokeless tobacco: Never Used  . Alcohol use No  . Drug use: No  . Sexual activity: Not on file   Other Topics Concern  . Not on file   Social History Narrative  . No narrative on file    Past Surgical History:  Procedure Laterality Date  . ABDOMINAL HYSTERECTOMY    . BLADDER SUSPENSION    . CHOLECYSTECTOMY    . TONSILLECTOMY      Family History  Problem Relation Age of Onset  . Alzheimer's disease Mother   . Alzheimer's disease Father   . Cancer Other        female, brain, lung  . Heart disease Other   . Stroke Other   . Diabetes Other   . Arthritis Other   . Hyperlipidemia Other   . Hypertension Other     Allergies  Allergen Reactions  . Adhesive [Tape]     blisters  . Penicillins     REACTION: rash  . Promethazine Hcl Other (See Comments)    Jerky movements    Current Outpatient Prescriptions on  File Prior to Visit  Medication Sig Dispense Refill  . clonazePAM (KLONOPIN) 0.5 MG tablet Take 1 tablet (0.5 mg total) by mouth 3 (three) times daily as needed for anxiety. 30 tablet 0  . DULoxetine (CYMBALTA) 60 MG capsule TAKE 1 CAPSULE (60 MG TOTAL) BY MOUTH DAILY. 90 capsule 1  . fluconazole (DIFLUCAN) 150 MG tablet TAKE 2 TABLETS (300 MG TOTAL) BY MOUTH ONCE A WEEK. 4 tablet 2  . gabapentin (NEURONTIN) 800 MG tablet Take 1 tablet (800 mg total) by mouth 3 (three) times daily. 90 tablet 3  . levothyroxine (SYNTHROID, LEVOTHROID) 75 MCG tablet TAKE 1 TABLET (75 MCG TOTAL) BY MOUTH DAILY. 30 tablet 1  . meloxicam (MOBIC) 7.5 MG tablet 1-2 tab po q day 60 tablet 0  . ondansetron (ZOFRAN) 4 MG tablet Take 1 tablet (4 mg total) by mouth every 8 (eight) hours as needed for  nausea. 30 tablet 0  . tiZANidine (ZANAFLEX) 2 MG tablet TAKE 1 TABLET (2 MG TOTAL) BY MOUTH AT BEDTIME. 30 tablet 0  . tretinoin microspheres (RETIN-A MICRO) 0.1 % gel Apply topically at bedtime. 45 g 1   No current facility-administered medications on file prior to visit.     BP 129/84   Pulse 74   Temp 98.1 F (36.7 C) (Oral)   Resp 16   Ht 5\' 7"  (1.702 m)   Wt 230 lb (104.3 kg)   LMP 11/14/1978   SpO2 100%   BMI 36.02 kg/m       Objective:   Physical Exam   General Mental Status- Alert. General Appearance- Not in acute distress.     Chest and Lung Exam Auscultation: Breath Sounds:-Normal.  Cardiovascular Auscultation:Rythm- Regular. Murmurs & Other Heart Sounds:Auscultation of the heart reveals- No Murmurs.   Neurologic Cranial Nerve exam:- CN III-XII intact(No nystagmus), symmetric smile. Strength:- 5/5 equal and symmetric strength both upper and lower extremities.     Assessment & Plan:  For anxiety that is severe will rx clonazepam at 1 mg dose of 30 tabs for short term but also send note to your pcp to she if she agrees with this dose and number going forward.  I will try to let you know future med plan when I get answer from Dr. Charlett Blake.  Follow up 2-4 weeks or as needed  Jazzlin Clements, Percell Miller, [employer]

## 2017-06-28 NOTE — Telephone Encounter (Signed)
Dr. Charlett Blake,  I sent you a copy of last note. Pt wants clonozepam 1 mg tid for anxiety. I am not sure you agree with this and only gave her limited supply. Advised 1/2-1 tab po tid. But she indicated 1/2 would not be adequate. She also stopped elavil.  Want your feedback and want you to be aware.  Thanks, Percell Miller

## 2017-06-28 NOTE — Patient Instructions (Addendum)
For anxiety that is severe will rx clonazepam at 1 mg dose of 30 tabs for short term but also send note to your pcp to she if she agrees with this dose and number going forward. Will let her know that you stopped elavil as well.  I will try to let you know future med plan when I get answer from Dr. Charlett Blake.  Follow up 2-4 weeks or as needed

## 2017-07-04 NOTE — Telephone Encounter (Addendum)
Relation to pt: self  Call back number:[phone]  Pharmacy: [id number] - [place], [place] - [address] AT [address]OSS [address]   D.O.D  Reason for call:  Patient checking on the status of message below, stating pharmacy never received Rx, and today she will run out, please advise

## 2017-07-05 NOTE — Telephone Encounter (Signed)
Even if she took a full tab of the the 1 mg tab of Clonopin 3 x daily she should not be out until 8/25 . I will write her a refill on Friday

## 2017-07-05 NOTE — Telephone Encounter (Signed)
I'm out of [facility] today

## 2017-07-06 ENCOUNTER — Other Ambulatory Visit: Payer: Self-pay

## 2017-07-06 MED ORDER — TRETINOIN MICROSPHERE 0.1 % EX GEL: Freq: Every day | CUTANEOUS | 1 refills | Status: DC

## 2017-07-06 NOTE — Telephone Encounter (Signed)
Refill faxed for Tretinoin/thx dmf

## 2017-07-07 ENCOUNTER — Other Ambulatory Visit: Payer: Self-pay | Admitting: Medical

## 2017-07-07 NOTE — Telephone Encounter (Signed)
Patient called in this afternoon regarding this message as message had not been handled/completed.Marland Kitchen Called the pharmacy and telephoned  in Clonazepam 1 mg #30 [facility]. Patient is aware. Did inform the patient of PCP instructions. Will forward this message back to PCP for review/FYI. Thanks,

## 2017-07-09 NOTE — Telephone Encounter (Signed)
She needs an appointment to get her dose stabilized. 1 mg three times a day is a high dose. She needs to manage with 2 mg in whatever combination works for her or I will likely refer her to psychiatry. Schedule her an appt in September please

## 2017-07-11 NOTE — Telephone Encounter (Signed)
Called patient left vmail to call the ofc back.   PC

## 2017-07-12 NOTE — Telephone Encounter (Signed)
SB-I spoke with Kayla [name]/the soonest she can get in with you is October which is why she has been in twice to see [name] is not taking the Amitriptyline anymore because "it made me feel real bad and it got worse each of the 3 nights I took it so I went back to taking the Clonazepam." /Dr. Erling Cruz had been Rx'ing 1mg  qid and it got bumped down to tid and made do on that just fine but now that she is off of the Amitriptyline she doesn't feel that she is ok on 2qd/cannot sleep and feels that really needs it/she states that she is taking them tid and on 8.24.18 she only was given #30 with the SIG 1/2-1tid prn anxiety/has an appt with Psychiatry about 2nd week in September/plz advise/thx dmf

## 2017-07-12 NOTE — Telephone Encounter (Signed)
Ok I am willing to write a 30 day supply for September but cannot refill the same if she does not see psychiatry. Disp #90, Thanks

## 2017-07-13 ENCOUNTER — Other Ambulatory Visit: Payer: Self-pay | Admitting: Medical

## 2017-07-13 MED ORDER — CLONAZEPAM 1 MG PO TABS: ORAL_TABLET | ORAL | 0 refills | Status: DC

## 2017-07-13 NOTE — Addendum Note (Signed)
Addended by: Marrion Coy on: 07/13/2017 08:44 AM   Modules accepted: Orders

## 2017-07-13 NOTE — Telephone Encounter (Signed)
Printed #90 Clonazepam/[facility]/LMOVM that what we discussed was agreed on for 30d and to call back and advise someone who answers when and where she is being seen for Psych as they will be assuming responsibility for this medication/thx dmf

## 2017-07-23 ENCOUNTER — Other Ambulatory Visit: Payer: Self-pay | Admitting: Family Medicine

## 2017-08-19 ENCOUNTER — Telehealth: Payer: Self-pay | Admitting: Medical

## 2017-08-19 ENCOUNTER — Other Ambulatory Visit: Payer: Self-pay | Admitting: Family Medicine

## 2017-08-21 ENCOUNTER — Ambulatory Visit: Payer: Medicare Other | Admitting: Medical

## 2017-08-21 DIAGNOSIS — Z0289 Encounter for other administrative examinations: Secondary | ICD-10-CM

## 2017-08-21 NOTE — Telephone Encounter (Signed)
Requesting: Clonazepam Contract: Yes UDS: 4.3.2018 Mod-risk next screen 10.03.2018 Last OV: 7.17.2018 Next OV: 10.16.2018 Last Refill: 9.03.2018   Please advise

## 2017-08-22 ENCOUNTER — Other Ambulatory Visit: Payer: Medicare Other

## 2017-08-23 ENCOUNTER — Ambulatory Visit (HOSPITAL_BASED_OUTPATIENT_CLINIC_OR_DEPARTMENT_OTHER)
Admission: RE | Admit: 2017-08-23 | Discharge: 2017-08-23 | Disposition: A | Discharge status: Home / Self Care | Payer: Medicare Other | Source: Ambulatory Visit | Attending: Medical | Admitting: Medical

## 2017-08-23 ENCOUNTER — Encounter: Payer: Self-pay | Admitting: Medical

## 2017-08-23 ENCOUNTER — Other Ambulatory Visit: Payer: Self-pay

## 2017-08-23 ENCOUNTER — Ambulatory Visit (INDEPENDENT_AMBULATORY_CARE_PROVIDER_SITE_OTHER): Payer: Medicare Other | Admitting: Medical

## 2017-08-23 VITALS — BP 113/71 | HR 64 | Temp 98.0°F | Resp 18 | Ht 67.0 in | Wt 234.0 lb

## 2017-08-23 DIAGNOSIS — G894 Chronic pain syndrome: Secondary | ICD-10-CM

## 2017-08-23 DIAGNOSIS — E039 Hypothyroidism, unspecified: Secondary | ICD-10-CM | POA: Diagnosis not present

## 2017-08-23 DIAGNOSIS — K759 Inflammatory liver disease, unspecified: Secondary | ICD-10-CM

## 2017-08-23 DIAGNOSIS — S022XXA Fracture of nasal bones, initial encounter for closed fracture: Secondary | ICD-10-CM | POA: Diagnosis not present

## 2017-08-23 DIAGNOSIS — R5383 Other fatigue: Secondary | ICD-10-CM | POA: Diagnosis not present

## 2017-08-23 DIAGNOSIS — E782 Mixed hyperlipidemia: Secondary | ICD-10-CM | POA: Diagnosis not present

## 2017-08-23 DIAGNOSIS — M25511 Pain in right shoulder: Secondary | ICD-10-CM

## 2017-08-23 DIAGNOSIS — J3489 Other specified disorders of nose and nasal sinuses: Secondary | ICD-10-CM

## 2017-08-23 DIAGNOSIS — R739 Hyperglycemia, unspecified: Secondary | ICD-10-CM | POA: Diagnosis not present

## 2017-08-23 DIAGNOSIS — X58XXXA Exposure to other specified factors, initial encounter: Secondary | ICD-10-CM | POA: Insufficient documentation

## 2017-08-23 MED ORDER — CELECOXIB 200 MG PO CAPS: 200.0000 mg | ORAL_CAPSULE | Freq: Every day | ORAL | 0 refills | Status: DC

## 2017-08-23 MED ORDER — LEVOTHYROXINE SODIUM 75 MCG PO TABS: ORAL_TABLET | ORAL | 1 refills | Status: DC

## 2017-08-23 NOTE — Progress Notes (Signed)
Subjective:    Patient ID: Kayla [name], female    DOB: October 06, 1946, 71 y.o.   MRN: [id number]  HPI   Pt states she fell out of bed the other night. Her thyroid tablet fell out and she tried to reach tabs and she out of bed. No loc. Hit her nose. No loc but  Nose did bleed a lot.   Pt states 2 month of being very fatigue. States feels wiped out. States insomnia and poor sleep.(used clonazepam and at time zanaflex to help her sleep.0  Pain when she touch bridge of nose has pain.  Pt also has some body aches. She states meloxicam not working very well. Pt never used celebrex in the past. Pt still has rt shoulder pain. Has had in past. She thinks fell on her side/shoulder. In past I put shoulder xray in 06-14-2017. But she never got the xray.   Review of Systems  Constitutional: Positive for fatigue. Negative for chills and fever.  Respiratory: Negative for chest tightness, shortness of breath and wheezing.   Cardiovascular: Negative for chest pain and palpitations.  Gastrointestinal: Negative for abdominal pain, blood in stool, constipation, diarrhea, nausea and vomiting.  Musculoskeletal: Negative for back pain, myalgias and neck pain.       Body aches. She attributes to fibromyalgia.  Skin: Negative for rash.  Neurological: Negative for dizziness, seizures, speech difficulty, weakness and light-headedness.  Hematological: Negative for adenopathy. Does not bruise/bleed easily.  Psychiatric/Behavioral: Positive for sleep disturbance. Negative for behavioral problems, confusion, dysphoric mood, hallucinations, self-injury and suicidal ideas. The patient is nervous/anxious.     Past Medical History:  Diagnosis Date  . Allergy    allergic rhinitis  . Anemia    NOS  . Arthritis    osteoarthritis  . Asthma   . Degenerative disc disease, lumbar 12/13/2015  . Depression   . Depression with anxiety 01/19/2009   Qualifier: Diagnosis of  By: Redmond Pulling MD, Frann Rider    . Fibromyalgia   . GERD  (gastroesophageal reflux disease)   . Hair loss 11/01/2016  . Headache(784.0)   . History of diverticulitis of colon   . Hx of colonic polyps   . Hyperglycemia 11/23/2014  . Hyperlipidemia   . Medicare annual wellness visit, subsequent 11/01/2016  . Migraine   . Mild cognitive impairment 04/29/2016  . Miscarriage   . Obesity, unspecified 10/26/2013  . Otitis, externa, infective 11/23/2014  . Thyroid disease    hypothyroidism  . Urinary incontinence      Social History   Social History  . Marital status: Divorced    Spouse name: N/A  . Number of children: N/A  . Years of education: N/A   Occupational History  . unemployed Retired   Social History Main Topics  . Smoking status: Never Smoker  . Smokeless tobacco: Never Used  . Alcohol use No  . Drug use: No  . Sexual activity: Not on file   Other Topics Concern  . Not on file   Social History Narrative  . No narrative on file    Past Surgical History:  Procedure Laterality Date  . ABDOMINAL HYSTERECTOMY    . BLADDER SUSPENSION    . CHOLECYSTECTOMY    . TONSILLECTOMY      Family History  Problem Relation Age of Onset  . Alzheimer's disease Mother   . Alzheimer's disease Father   . Cancer Other        female, brain, lung  . Heart disease Other   .  Stroke Other   . Diabetes Other   . Arthritis Other   . Hyperlipidemia Other   . Hypertension Other     Allergies  Allergen Reactions  . Adhesive [Tape]     blisters  . Penicillins     REACTION: rash  . Promethazine Hcl Other (See Comments)    Jerky movements    Current Outpatient Prescriptions on File Prior to Visit  Medication Sig Dispense Refill  . clonazePAM (KLONOPIN) 1 MG tablet TAKE 1/2 TO 1 TAB BY MOUTH 3 TIMES A DAY AS NEEDED FOR ANXIETY 90 tablet 0  . DULoxetine (CYMBALTA) 60 MG capsule TAKE 1 CAPSULE (60 MG TOTAL) BY MOUTH DAILY. 90 capsule 1  . fluconazole (DIFLUCAN) 150 MG tablet TAKE 2 TABLETS (300 MG TOTAL) BY MOUTH ONCE A WEEK. 4 tablet  2  . meloxicam (MOBIC) 7.5 MG tablet TAKE 1-2 TAB BY MOUTH EVERY DAY 60 tablet 0  . ondansetron (ZOFRAN) 4 MG tablet Take 1 tablet (4 mg total) by mouth every 8 (eight) hours as needed for nausea. 30 tablet 0  . tiZANidine (ZANAFLEX) 2 MG tablet TAKE 1 TABLET (2 MG TOTAL) BY MOUTH AT BEDTIME. 30 tablet 0   No current facility-administered medications on file prior to visit.     BP 113/71   Pulse 64   Temp 98 F (36.7 C) (Oral)   Resp 18   Ht 5\' 7"  (1.702 m)   Wt 234 lb (106.1 kg)   LMP 11/14/1978   SpO2 99%   BMI 36.65 kg/m       Objective:   Physical Exam  General Mental Status- Alert. General Appearance- Not in acute distress.   Skin General: Color- Normal Color. Moisture- Normal Moisture.  Neck Carotid Arteries- Normal color. Moisture- Normal Moisture. No carotid bruits. No JVD.  Chest and Lung Exam Auscultation: Breath Sounds:-Normal.  Cardiovascular Auscultation:Rythm- Regular. Murmurs & Other Heart Sounds:Auscultation of the heart reveals- No Murmurs.  Abdomen Inspection:-Inspeection Normal. Palpation/Percussion:Note:No mass. Palpation and Percussion of the abdomen reveal- Non Tender, Non Distended + BS, no rebound or guarding.    Neurologic Cranial Nerve exam:- CN III-XII intact(No nystagmus), symmetric smile. Strength:- 5/5 equal and symmetric strength both upper and lower extremities.  FACE- BRIDGE OF NOSE MILD BRUISED AND TENDER. FAINT BRUISING SIDE OF NOSE.  Rt shoulder- mild pain on palpation lateral aspect.     Assessment & Plan:  For your recent fall and injury to nasal bridge region, I will get an x-ray today. Also for your chronic right shoulder pain would recommend getting the x-ray I placed in August.  For fatigue please get future labs Dr. Charlett Blake placed. I think it would be beneficial to add B12, B1 and vitamin D.  For your right shoulder and history of pain will prescribe Celebrex. Hopefully this would work better than the  meloxicam(advised to stop). Tried to limit Celebrex 1 tablet a day. Also try not to use every day.  Please keep follow-up with Dr. Charlett Blake upcoming week. Return as needed.  Aleja Yearwood, Percell Miller, PA-C

## 2017-08-23 NOTE — Patient Instructions (Signed)
For your recent fall and injury to nasal bridge region, I will get an x-ray today. Also for your chronic right shoulder pain would recommend getting the x-ray I placed in August.  For fatigue please get future labs Dr. Charlett Blake placed. I think it would be beneficial to add B12, B1 and vitamin D.  For your right shoulder and history of pain will prescribe Celebrex. Hopefully this would work better than the meloxicam. Tried to limit Celebrex 1 tablet a day. Also try not to use every day.  Please keep follow-up with Dr. Charlett Blake upcoming week. Return as needed.

## 2017-08-24 LAB — COMPREHENSIVE METABOLIC PANEL
ALK PHOS: 75 U/L (ref 39–117)
ALT: 17 U/L (ref 0–35)
AST: 17 U/L (ref 0–37)
Albumin: 4.2 g/dL (ref 3.5–5.2)
BILIRUBIN TOTAL: 0.5 mg/dL (ref 0.2–1.2)
BUN: 23 mg/dL (ref 6–23)
CALCIUM: 9.4 mg/dL (ref 8.4–10.5)
CO2: 31 mEq/L (ref 19–32)
Chloride: 103 mEq/L (ref 96–112)
Creatinine, Ser: 0.94 mg/dL (ref 0.40–1.20)
GFR: 62.35 mL/min (ref >60.00)
GLUCOSE: 91 mg/dL (ref 70–99)
POTASSIUM: 4.6 meq/L (ref 3.5–5.1)
Sodium: 139 mEq/L (ref 135–145)
TOTAL PROTEIN: 7.4 g/dL (ref 6.0–8.3)

## 2017-08-24 LAB — LIPID PANEL
Cholesterol: 257 mg/dL — ABNORMAL HIGH (ref 0–200)
HDL: 59.5 mg/dL (ref >39.00)
LDL Cholesterol: 171 mg/dL — ABNORMAL HIGH (ref 0–99)
NonHDL: 197.27
TRIGLYCERIDES: 131 mg/dL (ref 0.0–149.0)
Total CHOL/HDL Ratio: 4
VLDL: 26.2 mg/dL (ref 0.0–40.0)

## 2017-08-24 LAB — HEPATITIS PANEL, ACUTE
HEP B C IGM: NONREACTIVE
HEP B S AG: NONREACTIVE
Hep A IgM: NONREACTIVE
Hepatitis C Ab: NONREACTIVE
SIGNAL TO CUT-OFF: 0.01 (ref <1.00)

## 2017-08-24 LAB — CBC
HEMATOCRIT: 43 % (ref 36.0–46.0)
HEMOGLOBIN: 14.1 g/dL (ref 12.0–15.0)
MCHC: 32.8 g/dL (ref 30.0–36.0)
MCV: 84.9 fl (ref 78.0–100.0)
PLATELETS: 316 10*3/uL (ref 150.0–400.0)
RBC: 5.07 Mil/uL (ref 3.87–5.11)
RDW: 12.9 % (ref 11.5–15.5)
WBC: 8.5 10*3/uL (ref 4.0–10.5)

## 2017-08-24 LAB — VITAMIN B12: VITAMIN B 12: 502 pg/mL (ref 211–911)

## 2017-08-24 LAB — TSH: TSH: 0.23 u[IU]/mL — ABNORMAL LOW (ref 0.35–4.50)

## 2017-08-28 ENCOUNTER — Telehealth: Payer: Self-pay

## 2017-08-28 LAB — VITAMIN D 1,25 DIHYDROXY
Vitamin D 1, 25 (OH)2 Total: 23 pg/mL (ref 18–72)
Vitamin D3 1, 25 (OH)2: 23 pg/mL

## 2017-08-28 LAB — VITAMIN B1: Vitamin B1 (Thiamine): 11 nmol/L (ref 8–30)

## 2017-08-28 NOTE — Telephone Encounter (Signed)
-----   Message from Mackie Pai, PA-C sent at 08/23/2017  8:47 PM EDT ----- You have very small fracture at tip of nasal bone. Not out of alignment. I think in light of xray findings you don't have much swelling. The fracture is nondisplaced. I don't think referral to specialist needed in light of nondisplaced and symmetric appearance to nose.

## 2017-08-28 NOTE — Telephone Encounter (Signed)
Called to inform Pt of xray results and Dr's findings. Pt didn't answer. Left voicemail for Pt to call back.

## 2017-08-29 ENCOUNTER — Ambulatory Visit (INDEPENDENT_AMBULATORY_CARE_PROVIDER_SITE_OTHER): Payer: Medicare Other | Admitting: Family Medicine

## 2017-08-29 ENCOUNTER — Encounter: Payer: Self-pay | Admitting: Family Medicine

## 2017-08-29 VITALS — BP 120/76 | HR 86 | Temp 98.0°F | Resp 18 | Wt 227.4 lb

## 2017-08-29 DIAGNOSIS — E039 Hypothyroidism, unspecified: Secondary | ICD-10-CM | POA: Diagnosis not present

## 2017-08-29 DIAGNOSIS — B379 Candidiasis, unspecified: Secondary | ICD-10-CM

## 2017-08-29 DIAGNOSIS — R739 Hyperglycemia, unspecified: Secondary | ICD-10-CM

## 2017-08-29 DIAGNOSIS — G4700 Insomnia, unspecified: Secondary | ICD-10-CM | POA: Diagnosis not present

## 2017-08-29 DIAGNOSIS — G2581 Restless legs syndrome: Secondary | ICD-10-CM | POA: Diagnosis not present

## 2017-08-29 DIAGNOSIS — G3184 Mild cognitive impairment, so stated: Secondary | ICD-10-CM | POA: Diagnosis not present

## 2017-08-29 DIAGNOSIS — R413 Other amnesia: Secondary | ICD-10-CM

## 2017-08-29 DIAGNOSIS — E782 Mixed hyperlipidemia: Secondary | ICD-10-CM

## 2017-08-29 HISTORY — DX: Restless legs syndrome: G25.81

## 2017-08-29 MED ORDER — LEVOTHYROXINE SODIUM 75 MCG PO TABS: ORAL_TABLET | ORAL | 1 refills | Status: DC

## 2017-08-29 MED ORDER — ROPINIROLE HCL 0.5 MG PO TABS: 0.5000 mg | ORAL_TABLET | Freq: Every day | ORAL | 2 refills | Status: DC

## 2017-08-29 MED ORDER — FLUCONAZOLE 150 MG PO TABS: 300.0000 mg | ORAL_TABLET | ORAL | 2 refills | Status: DC

## 2017-08-29 MED ORDER — DULOXETINE HCL 60 MG PO CPEP: ORAL_CAPSULE | ORAL | 1 refills | Status: DC

## 2017-08-29 MED ORDER — ATORVASTATIN CALCIUM 10 MG PO TABS: 10.0000 mg | ORAL_TABLET | ORAL | 3 refills | Status: DC

## 2017-08-29 NOTE — Patient Instructions (Signed)

## 2017-08-29 NOTE — Assessment & Plan Note (Signed)
hgba1c acceptable, minimize simple carbs. Increase exercise as tolerated.  

## 2017-08-29 NOTE — Assessment & Plan Note (Signed)
Start Requip 0.5 mg tabs and increase to 1 mg qhs

## 2017-08-29 NOTE — Assessment & Plan Note (Signed)
Encouraged heart healthy diet, increase exercise, avoid trans fats, consider a krill oil cap daily 

## 2017-08-29 NOTE — Assessment & Plan Note (Signed)
On Levothyroxine, continue to monitor 

## 2017-08-29 NOTE — Progress Notes (Signed)
Subjective:  I acted as a Education administrator for Dr. Charlett Blake. [place], [place]  Patient ID: Kayla [name], female    DOB: 03/05/1946, 71 y.o.   MRN: [id number]  No chief complaint on file.   HPI  Patient is in today for a 3 month follow up. She is increasingly worried about her progressive memory loss. She is still able to complete her activities of daily living. No recent febrile illness or hospitalizations. She is having ongoing trouble sleeping. She notes she has trouble getting her legs still and comfortable at the end of the day. It disrupts sleep. Denies CP/palp/SOB/HA/congestion/fevers/GI or GU c/o. Taking meds as prescribed  Patient Care Team: Mosie Lukes, MD as PCP - General (Family Medicine)   Past Medical History:  Diagnosis Date  . Allergy    allergic rhinitis  . Anemia    NOS  . Arthritis    osteoarthritis  . Asthma   . Degenerative disc disease, lumbar 12/13/2015  . Depression   . Depression with anxiety 01/19/2009   Qualifier: Diagnosis of  By: Redmond Pulling MD, Frann Rider    . Fibromyalgia   . GERD (gastroesophageal reflux disease)   . Hair loss 11/01/2016  . Headache(784.0)   . History of diverticulitis of colon   . Hx of colonic polyps   . Hyperglycemia 11/23/2014  . Hyperlipidemia   . Medicare annual wellness visit, subsequent 11/01/2016  . Migraine   . Mild cognitive impairment 04/29/2016  . Miscarriage   . Obesity, unspecified 10/26/2013  . Otitis, externa, infective 11/23/2014  . Restless leg 08/29/2017  . Thyroid disease    hypothyroidism  . Urinary incontinence     Past Surgical History:  Procedure Laterality Date  . ABDOMINAL HYSTERECTOMY    . BLADDER SUSPENSION    . CHOLECYSTECTOMY    . TONSILLECTOMY      Family History  Problem Relation Age of Onset  . Alzheimer's disease Mother   . Alzheimer's disease Father   . Cancer Other        female, brain, lung  . Heart disease Other   . Stroke Other   . Diabetes Other   . Arthritis Other   . Hyperlipidemia  Other   . Hypertension Other     Social History   Social History  . Marital status: Divorced    Spouse name: N/A  . Number of children: N/A  . Years of education: N/A   Occupational History  . unemployed Retired   Social History Main Topics  . Smoking status: Never Smoker  . Smokeless tobacco: Never Used  . Alcohol use No  . Drug use: No  . Sexual activity: Not on file   Other Topics Concern  . Not on file   Social History Narrative  . No narrative on file    Outpatient Medications Prior to Visit  Medication Sig Dispense Refill  . celecoxib (CELEBREX) 200 MG capsule Take 1 capsule (200 mg total) by mouth daily. 14 capsule 0  . clonazePAM (KLONOPIN) 1 MG tablet TAKE 1/2 TO 1 TAB BY MOUTH 3 TIMES A DAY AS NEEDED FOR ANXIETY 90 tablet 0  . ondansetron (ZOFRAN) 4 MG tablet Take 1 tablet (4 mg total) by mouth every 8 (eight) hours as needed for nausea. 30 tablet 0  . tiZANidine (ZANAFLEX) 2 MG tablet TAKE 1 TABLET (2 MG TOTAL) BY MOUTH AT BEDTIME. 30 tablet 0  . DULoxetine (CYMBALTA) 60 MG capsule TAKE 1 CAPSULE (60 MG TOTAL) BY MOUTH DAILY. [place]  capsule 1  . fluconazole (DIFLUCAN) 150 MG tablet TAKE 2 TABLETS (300 MG TOTAL) BY MOUTH ONCE A WEEK. 4 tablet 2  . levothyroxine (SYNTHROID, LEVOTHROID) 75 MCG tablet TAKE 1 TABLET (75 MCG TOTAL) BY MOUTH DAILY. 30 tablet 1  . meloxicam (MOBIC) 7.5 MG tablet TAKE 1-2 TAB BY MOUTH EVERY DAY 60 tablet 0   No facility-administered medications prior to visit.     Allergies  Allergen Reactions  . Adhesive [Tape]     blisters  . Penicillins     REACTION: rash  . Promethazine Hcl Other (See Comments)    Jerky movements    Review of Systems  Constitutional: Negative for fever and malaise/fatigue.  HENT: Negative for congestion.   Eyes: Negative for blurred vision.  Respiratory: Negative for cough and shortness of breath.   Cardiovascular: Negative for chest pain, palpitations and leg swelling.  Gastrointestinal: Negative for  vomiting.  Musculoskeletal: Positive for joint pain. Negative for back pain.  Skin: Negative for rash.  Neurological: Negative for loss of consciousness and headaches.  Psychiatric/Behavioral: Positive for depression and memory loss. The patient is nervous/anxious and has insomnia.        Objective:    Physical Exam  Constitutional: She is oriented to person, place, and time. She appears well-developed and well-nourished. No distress.  HENT:  Head: Normocephalic and atraumatic.  Eyes: Conjunctivae are normal.  Neck: Normal range of motion. No thyromegaly present.  Cardiovascular: Normal rate and regular rhythm.   Pulmonary/Chest: Effort normal and breath sounds normal. She has no wheezes.  Abdominal: Soft. Bowel sounds are normal. There is no tenderness.  Musculoskeletal: Normal range of motion. She exhibits no edema or deformity.  Neurological: She is alert and oriented to person, place, and time.  Skin: Skin is warm and dry. She is not diaphoretic.  Psychiatric: She has a normal mood and affect.    BP 120/76 (BP Location: Left Arm, Patient Position: Sitting, Cuff Size: Normal)   Pulse 86   Temp 98 F (36.7 C) (Oral)   Resp 18   Wt 227 lb 6.4 oz (103.1 kg)   LMP 11/14/1978   SpO2 98%   BMI 35.62 kg/m  Wt Readings from Last 3 Encounters:  08/29/17 227 lb 6.4 oz (103.1 kg)  08/23/17 234 lb (106.1 kg)  06/28/17 230 lb (104.3 kg)   BP Readings from Last 3 Encounters:  08/29/17 120/76  08/23/17 113/71  06/28/17 129/84     Immunization History  Administered Date(s) Administered  . Influenza Split 09/13/2011  . Influenza Whole 08/31/2010  . Influenza,inj,Quad PF,6+ Mos 07/25/2013  . Pneumococcal Conjugate-13 10/25/2013  . Tdap 10/25/2013    Health Maintenance  Topic Date Due  . DEXA SCAN  06/04/2011  . PNA vac Low Risk Adult (2 of 2 - PPSV23) 10/25/2014  . INFLUENZA VACCINE  02/11/2018 (Originally 06/14/2017)  . MAMMOGRAM  06/24/2019  . COLONOSCOPY  02/23/2022  .  TETANUS/TDAP  10/26/2023  . Hepatitis C Screening  Completed    Lab Results  Component Value Date   WBC 8.5 08/23/2017   HGB 14.1 08/23/2017   HCT 43.0 08/23/2017   PLT 316.0 08/23/2017   GLUCOSE 91 08/23/2017   CHOL 257 (H) 08/23/2017   TRIG 131.0 08/23/2017   HDL 59.50 08/23/2017   LDLCALC 171 (H) 08/23/2017   ALT 17 08/23/2017   AST 17 08/23/2017   NA 139 08/23/2017   K 4.6 08/23/2017   CL 103 08/23/2017   CREATININE 0.94 08/23/2017  BUN 23 08/23/2017   CO2 31 08/23/2017   TSH 0.23 (L) 08/23/2017   HGBA1C 5.6 11/01/2016    Lab Results  Component Value Date   TSH 0.23 (L) 08/23/2017   Lab Results  Component Value Date   WBC 8.5 08/23/2017   HGB 14.1 08/23/2017   HCT 43.0 08/23/2017   MCV 84.9 08/23/2017   PLT 316.0 08/23/2017   Lab Results  Component Value Date   NA 139 08/23/2017   K 4.6 08/23/2017   CO2 31 08/23/2017   GLUCOSE 91 08/23/2017   BUN 23 08/23/2017   CREATININE 0.94 08/23/2017   BILITOT 0.5 08/23/2017   ALKPHOS 75 08/23/2017   AST 17 08/23/2017   ALT 17 08/23/2017   PROT 7.4 08/23/2017   ALBUMIN 4.2 08/23/2017   CALCIUM 9.4 08/23/2017   GFR 62.35 08/23/2017   Lab Results  Component Value Date   CHOL 257 (H) 08/23/2017   Lab Results  Component Value Date   HDL 59.50 08/23/2017   Lab Results  Component Value Date   LDLCALC 171 (H) 08/23/2017   Lab Results  Component Value Date   TRIG 131.0 08/23/2017   Lab Results  Component Value Date   CHOLHDL 4 08/23/2017   Lab Results  Component Value Date   HGBA1C 5.6 11/01/2016         Assessment & Plan:   Problem List Items Addressed This Visit    Hypothyroidism    On Levothyroxine, continue to monitor      Relevant Medications   levothyroxine (SYNTHROID, LEVOTHROID) 75 MCG tablet   Hyperlipidemia    Encouraged heart healthy diet, increase exercise, avoid trans fats, consider a krill oil cap daily      Relevant Medications   atorvastatin (LIPITOR) 10 MG tablet  (Start on 08/31/2017)   Hyperglycemia    hgba1c acceptable, minimize simple carbs. Increase exercise as tolerated.      Insomnia    Encouraged good sleep hygiene such as dark, quiet room. No blue/green glowing lights such as computer screens in bedroom. No alcohol or stimulants in evening. Cut down on caffeine as able. Regular exercise is helpful but not just prior to bed time.       Mild cognitive impairment    Patient is increasingly concerned about memory loss, she is referred to neurology for neuropsychologic testing.       Restless leg    Start Requip 0.5 mg tabs and increase to 1 mg qhs       Other Visit Diagnoses    Memory loss    -  Primary   Relevant Orders   Ambulatory referral to Neurology   Yeast infection       Relevant Medications   fluconazole (DIFLUCAN) 150 MG tablet      I have discontinued Ms. Iles's meloxicam. I have also changed her levothyroxine. Additionally, I am having her start on rOPINIRole and atorvastatin. Lastly, I am having her maintain her ondansetron, tiZANidine, clonazePAM, celecoxib, fluconazole, and DULoxetine.  Meds ordered this encounter  Medications  . fluconazole (DIFLUCAN) 150 MG tablet    Sig: Take 2 tablets (300 mg total) by mouth once a week.    Dispense:  4 tablet    Refill:  2  . DULoxetine (CYMBALTA) 60 MG capsule    Sig: TAKE 1 CAPSULE (60 MG TOTAL) BY MOUTH DAILY.    Dispense:  90 capsule    Refill:  1  . rOPINIRole (REQUIP) 0.5 MG tablet  Sig: Take 1-2 tablets (0.5-1 mg total) by mouth at bedtime.    Dispense:  60 tablet    Refill:  2  . levothyroxine (SYNTHROID, LEVOTHROID) 75 MCG tablet    Sig: TAKE 1 TABLET (75 MCG TOTAL) BY MOUTH DAILY except skip Saturday dose    Dispense:  30 tablet    Refill:  1  . atorvastatin (LIPITOR) 10 MG tablet    Sig: Take 1 tablet (10 mg total) by mouth 2 (two) times a week.    Dispense:  10 tablet    Refill:  3    CMA served as scribe during this visit. History, Physical and  Plan performed by medical provider. Documentation and orders reviewed and attested to.  Penni Homans, MD

## 2017-08-29 NOTE — Assessment & Plan Note (Signed)
Encouraged good sleep hygiene such as dark, quiet room. No blue/green glowing lights such as computer screens in bedroom. No alcohol or stimulants in evening. Cut down on caffeine as able. Regular exercise is helpful but not just prior to bed time.  

## 2017-08-30 NOTE — Assessment & Plan Note (Signed)
Patient is increasingly concerned about memory loss, she is referred to neurology for neuropsychologic testing.

## 2017-09-01 ENCOUNTER — Telehealth: Payer: Self-pay

## 2017-09-01 MED ORDER — TRAMADOL HCL 50 MG PO TABS: 50.0000 mg | ORAL_TABLET | Freq: Two times a day (BID) | ORAL | 0 refills | Status: DC | PRN

## 2017-09-01 NOTE — Telephone Encounter (Signed)
Notify patient was still in pain despite Celebrex twice daily.Got notified around 5:30 PM on Friday. Approved 6 tablets of tramadol to help her out over the weekend and advised her to call us back on Monday for update on how she is.

## 2017-09-07 ENCOUNTER — Telehealth: Payer: Self-pay | Admitting: *Deleted

## 2017-09-07 DIAGNOSIS — M25511 Pain in right shoulder: Secondary | ICD-10-CM

## 2017-09-07 NOTE — Telephone Encounter (Signed)
Please advise 

## 2017-09-07 NOTE — Telephone Encounter (Signed)
Copied from [place]. Topic: Quick Communication - See Telephone Encounter >> Sep 07, 2017  3:21 PM Eli Phillips, [place] wrote: CRM for notification. See Telephone encounter for: Medication refill   09/07/17.  Patient called and states she needs a refill of the tramadol that [employer] prescribed for her. Patient states she is complaining of pain. She states it is Arthritis  Patient states you can call her is you have any questions. Her contact number is  [phone]. Please advise. Thank you

## 2017-09-07 NOTE — Telephone Encounter (Signed)
Pt needs to be scheduled with PCP

## 2017-09-07 NOTE — Telephone Encounter (Signed)
error 

## 2017-09-07 NOTE — Telephone Encounter (Signed)
Patient states she just seen Dr. Charlett Blake.  Don't understand why she needs to come in to see PCP. Patient is having pain all over. Please see below message

## 2017-09-07 NOTE — Telephone Encounter (Signed)
She cannot stay on Tramadol and clonazepam together. If she wold like a referral to pain management to discuss options I can do that if she wants to come in here to discuss optins she can.

## 2017-09-08 NOTE — Telephone Encounter (Signed)
Left message for patient to call the office back

## 2017-09-11 ENCOUNTER — Telehealth: Payer: Self-pay | Admitting: Family Medicine

## 2017-09-11 NOTE — Telephone Encounter (Signed)
Patient returning nurse call regarding medication. She states that she was advise to come back in for a visit however, she has been in recently. Please advise.

## 2017-09-12 NOTE — Telephone Encounter (Signed)
I see no open phone notes on this patient except from 09/01/17 from [facility] regarding tramadol Rx. Please see that phone note. Response was routed to [place].

## 2017-09-12 NOTE — Telephone Encounter (Signed)
We cannot prescribe Clonazepam and Tramadol pain meds both long term if she would like referral to pain management for further management. When they are prescribed together persistently there is an increased risk of sudden death so she will have to go see a specialist to discuss

## 2017-09-12 NOTE — Telephone Encounter (Signed)
Pt wants refill of tramadol. I wrote her for celebrex for shoulder pain post falling out of bed. Also she had bridge of nose fracture. I last filled tramadol on Friday afternoon if I remember correctly. She called late on Friday stating celebrex was not adequate. I a not sure tramadol indicated presently. I am hesitant to write repeat controlled meds for others patients  if not clearly indicated. She saw you last on 08-29-2017. Just being cautious.

## 2017-09-12 NOTE — Telephone Encounter (Signed)
Spoke with pt. She states the Tramadol did help and would like a refill on this. Please advise?

## 2017-09-13 NOTE — Telephone Encounter (Signed)
Son in law sent on deployment so she is going to stay with daughter for a while. Pt so miserable from pain she cannot sleep. Pt wants pain meds to take with her. Also pt states she is fine with referral to pain clinic if needed in the future. Taking NSAID with no resolve of pain. But pt for now would like something she can take for pain. Call pt advise [phone]. [facility] [address].

## 2017-09-13 NOTE — Telephone Encounter (Signed)
Son in law sent on deployment so she is going to stay with daughter for a while. Pt so miserable from pain she cannot sleep. Pt wants pain meds to take with her. Also pt states she is fine with referral to pain clinic if needed in the future. Taking NSAID with no resolve of pain. But pt for now would like something she can take for pain. Call pt advise [phone]. [facility] [address]  Dr. Charlett Blake,   This is pt response after we made her aware of policy. I can go ahead and write the referral to pain specialist. This may take time. But still problem with her request for tramadol. I will be in office tomorrow and we can discuss?  Thanks, Percell Miller

## 2017-09-14 ENCOUNTER — Encounter: Payer: Self-pay | Admitting: Psychology

## 2017-09-14 ENCOUNTER — Telehealth: Payer: Self-pay | Admitting: Medical

## 2017-09-14 MED ORDER — TRAMADOL HCL 50 MG PO TABS: ORAL_TABLET | ORAL | 0 refills | Status: DC

## 2017-09-14 NOTE — Telephone Encounter (Signed)
Thanks I am out of the office for a family matter. I would appreciate it if you would make her pain management referral and write her a one last rx for Tramadol 50 mg, 1 tab po daily prn pain, disp #30. Warn her not to take one within 4 hours of a Clonazepam. I will be back in office again on 09/18/17

## 2017-09-14 NOTE — Telephone Encounter (Signed)
Notify patient she can pick up the prescription of tramadol.  Let her know the instructions say up to every 4 hours as needed for pain but I would recommend using it less frequently.  Try to use twice a day if she can get by with that.  Do not use clonazepam within 4 hours of tramadol use.  Also the last tramadol she uses better due to the fact that she also has Cymbalta.  Both medications can increase serotonin and there is a rare side effect that can occur.  So again less frequent use of tramadol would be recommended.
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
## 2017-09-14 NOTE — Telephone Encounter (Signed)
Notified rx faxed to pharmacy and instructions.

## 2017-09-15 ENCOUNTER — Other Ambulatory Visit: Payer: Self-pay

## 2017-09-15 MED ORDER — TIZANIDINE HCL 2 MG PO TABS: 2.0000 mg | ORAL_TABLET | Freq: Every day | ORAL | 0 refills | Status: DC

## 2017-09-17 DIAGNOSIS — M545 Low back pain: Secondary | ICD-10-CM | POA: Diagnosis not present

## 2017-09-17 DIAGNOSIS — M797 Fibromyalgia: Secondary | ICD-10-CM | POA: Diagnosis not present

## 2017-09-17 DIAGNOSIS — K449 Diaphragmatic hernia without obstruction or gangrene: Secondary | ICD-10-CM | POA: Diagnosis not present

## 2017-09-17 DIAGNOSIS — M47815 Spondylosis without myelopathy or radiculopathy, thoracolumbar region: Secondary | ICD-10-CM | POA: Diagnosis not present

## 2017-09-17 DIAGNOSIS — M546 Pain in thoracic spine: Secondary | ICD-10-CM | POA: Diagnosis not present

## 2017-09-17 DIAGNOSIS — S39012A Strain of muscle, fascia and tendon of lower back, initial encounter: Secondary | ICD-10-CM | POA: Diagnosis not present

## 2017-09-18 ENCOUNTER — Other Ambulatory Visit: Payer: Self-pay

## 2017-09-18 MED ORDER — TIZANIDINE HCL 2 MG PO TABS: 2.0000 mg | ORAL_TABLET | Freq: Every day | ORAL | 0 refills | Status: DC

## 2017-09-18 MED ORDER — AMITRIPTYLINE HCL 25 MG PO TABS: 25.0000 mg | ORAL_TABLET | Freq: Every day | ORAL | 2 refills | Status: DC

## 2017-09-20 ENCOUNTER — Other Ambulatory Visit: Payer: Self-pay | Admitting: Family Medicine

## 2017-10-08 ENCOUNTER — Other Ambulatory Visit: Payer: Self-pay | Admitting: Family Medicine

## 2017-10-09 ENCOUNTER — Other Ambulatory Visit: Payer: Self-pay | Admitting: Medical

## 2017-10-09 NOTE — Telephone Encounter (Signed)
Requesting:Klonopin Contract:yes UDS:low risk next screen 08/16/17 Last OV:08/29/17 Next OV:10/17/17 Last Refill:08/21/17  #[id number]   Please advise

## 2017-10-12 NOTE — Telephone Encounter (Signed)
rx faxed

## 2017-10-13 ENCOUNTER — Other Ambulatory Visit: Payer: Self-pay

## 2017-10-13 MED ORDER — TIZANIDINE HCL 2 MG PO TABS: 2.0000 mg | ORAL_TABLET | Freq: Every day | ORAL | 0 refills | Status: DC

## 2017-10-16 ENCOUNTER — Other Ambulatory Visit: Payer: Self-pay | Admitting: Medical

## 2017-10-16 NOTE — Telephone Encounter (Signed)
Date uds and ok to refill will print

## 2017-10-16 NOTE — Telephone Encounter (Signed)
Requesting:tramadol Contract:yes VWA:[id number] risk next screen 08/16/17 Last OV:08/29/17 Next OV:10/17/17 Last Refill:09/14/17  #[id number]   Please advise

## 2017-10-17 ENCOUNTER — Ambulatory Visit (INDEPENDENT_AMBULATORY_CARE_PROVIDER_SITE_OTHER): Payer: Medicare Other | Admitting: Family Medicine

## 2017-10-17 VITALS — BP 124/68 | HR 93 | Wt 217.2 lb

## 2017-10-17 DIAGNOSIS — Z79899 Other long term (current) drug therapy: Secondary | ICD-10-CM | POA: Diagnosis not present

## 2017-10-17 DIAGNOSIS — G4700 Insomnia, unspecified: Secondary | ICD-10-CM | POA: Diagnosis not present

## 2017-10-17 DIAGNOSIS — E782 Mixed hyperlipidemia: Secondary | ICD-10-CM

## 2017-10-17 DIAGNOSIS — K5900 Constipation, unspecified: Secondary | ICD-10-CM | POA: Diagnosis not present

## 2017-10-17 DIAGNOSIS — G894 Chronic pain syndrome: Secondary | ICD-10-CM

## 2017-10-17 DIAGNOSIS — R739 Hyperglycemia, unspecified: Secondary | ICD-10-CM | POA: Diagnosis not present

## 2017-10-17 DIAGNOSIS — E039 Hypothyroidism, unspecified: Secondary | ICD-10-CM | POA: Diagnosis not present

## 2017-10-17 DIAGNOSIS — F418 Other specified anxiety disorders: Secondary | ICD-10-CM | POA: Diagnosis not present

## 2017-10-17 DIAGNOSIS — E6609 Other obesity due to excess calories: Secondary | ICD-10-CM | POA: Diagnosis not present

## 2017-10-17 MED ORDER — TIZANIDINE HCL 4 MG PO TABS: 4.0000 mg | ORAL_TABLET | Freq: Two times a day (BID) | ORAL | 1 refills | Status: DC | PRN

## 2017-10-17 MED ORDER — AMITRIPTYLINE HCL 25 MG PO TABS
25.0000 mg | ORAL_TABLET | Freq: Every day | ORAL | 3 refills | Status: DC
Start: 2017-10-17 — End: 2018-01-11

## 2017-10-17 NOTE — Patient Instructions (Addendum)
Encouraged increased hydration and fiber in diet. Daily probiotics. If bowels not moving can use MOM 2 tbls po in 4 oz of warm prune juice by mouth every 2-3 days. If no results then repeat in 4 hours with  Dulcolax suppository pr, may repeat again in 4 more hours as needed. Seek care if symptoms worsen. Consider daily Miralax and/or Dulcolax if symptoms persist.   MIralax with Benefiber once or twice daily   Cholesterol Cholesterol is a white, waxy, fat-like substance that is needed by the human body in small amounts. The liver makes all the cholesterol we need. Cholesterol is carried from the liver by the blood through the blood vessels. Deposits of cholesterol (plaques) may build up on blood vessel (artery) walls. Plaques make the arteries narrower and stiffer. Cholesterol plaques increase the risk for heart attack and stroke. You cannot feel your cholesterol level even if it is very high. The only way to know that it is high is to have a blood test. Once you know your cholesterol levels, you should keep a record of the test results. Work with your health care provider to keep your levels in the desired range. What do the results mean?  Total cholesterol is a rough measure of all the cholesterol in your blood.  LDL (low-density lipoprotein) is the "bad" cholesterol. This is the type that causes plaque to build up on the artery walls. You want this level to be low.  HDL (high-density lipoprotein) is the "good" cholesterol because it cleans the arteries and carries the LDL away. You want this level to be high.  Triglycerides are fat that the body can either burn for energy or store. High levels are closely linked to heart disease. What are the desired levels of cholesterol?  Total cholesterol below 200.  LDL below 100 for people who are at risk, below 70 for people at very high risk.  HDL above 40 is good. A level of 60 or higher is considered to be protective against heart  disease.  Triglycerides below 150. How can I lower my cholesterol? Diet Follow your diet program as told by your health care provider.  Choose fish or white meat chicken and [place], roasted or baked. Limit fatty cuts of red meat, fried foods, and processed meats, such as sausage and lunch meats.  Eat lots of fresh fruits and vegetables.  Choose whole grains, beans, pasta, potatoes, and cereals.  Choose olive oil, corn oil, or canola oil, and use only small amounts.  Avoid butter, mayonnaise, shortening, or palm kernel oils.  Avoid foods with trans fats.  Drink skim or nonfat milk and eat low-fat or nonfat yogurt and cheeses. Avoid whole milk, cream, ice cream, egg yolks, and full-fat cheeses.  Healthier desserts include angel food cake, ginger snaps, animal crackers, hard candy, popsicles, and low-fat or nonfat frozen yogurt. Avoid pastries, cakes, pies, and cookies.  Exercise  Follow your exercise program as told by your health care provider. A regular program: ? Helps to decrease LDL and raise HDL. ? Helps with weight control.  Do things that increase your activity level, such as gardening, walking, and taking the stairs.  Ask your health care provider about ways that you can be more active in your daily life.  Medicine  Take over-the-counter and prescription medicines only as told by your health care provider. ? Medicine may be prescribed by your health care provider to help lower cholesterol and decrease the risk for heart disease. This is usually done  if diet and exercise have failed to bring down cholesterol levels. ? If you have several risk factors, you may need medicine even if your levels are normal.  This information is not intended to replace advice given to you by your health care provider. Make sure you discuss any questions you have with your health care provider. Document Released: 07/26/2001 Document Revised: 05/28/2016 Document Reviewed: 04/30/2016 Elsevier  Interactive Patient Education  2017 [employer].

## 2017-10-17 NOTE — Progress Notes (Signed)
Subjective:  I acted as a Education administrator for Dr. Charlett Blake. [place], [place]  Patient ID: Kayla [name], female    DOB: 1946/04/03, 71 y.o.   MRN: [id number]  No chief complaint on file.   HPI  Patient is in today for a 6 week follow up and and overall is about the same.  She continues to struggle with chronic pain it is diffuse as well as in her back and joints.  She does not like the tramadol so she has stopped it.  Tizanidine at 2 mg is insufficient for milligrams is more helpful.  They stop Lipitor as well thinking it contributed to her pain.  No recent febrile illness.  She continues to struggle with anhedonia but does not endorse suicidal ideation.  No recent hospitalization or febrile illness.  Patient Care Team: Mosie Lukes, MD as PCP - General (Family Medicine)   Past Medical History:  Diagnosis Date  . Allergy    allergic rhinitis  . Anemia    NOS  . Arthritis    osteoarthritis  . Asthma   . Degenerative disc disease, lumbar 12/13/2015  . Depression   . Depression with anxiety 01/19/2009   Qualifier: Diagnosis of  By: Redmond Pulling MD, Frann Rider    . Fibromyalgia   . GERD (gastroesophageal reflux disease)   . Hair loss 11/01/2016  . Headache(784.0)   . History of diverticulitis of colon   . Hx of colonic polyps   . Hyperglycemia 11/23/2014  . Hyperlipidemia   . Medicare annual wellness visit, subsequent 11/01/2016  . Migraine   . Mild cognitive impairment 04/29/2016  . Miscarriage   . Obesity, unspecified 10/26/2013  . Otitis, externa, infective 11/23/2014  . Restless leg 08/29/2017  . Thyroid disease    hypothyroidism  . Urinary incontinence     Past Surgical History:  Procedure Laterality Date  . ABDOMINAL HYSTERECTOMY    . BLADDER SUSPENSION    . CHOLECYSTECTOMY    . TONSILLECTOMY      Family History  Problem Relation Age of Onset  . Alzheimer's disease Mother   . Alzheimer's disease Father   . Cancer Other        female, brain, lung  . Heart disease Other   .  Stroke Other   . Diabetes Other   . Arthritis Other   . Hyperlipidemia Other   . Hypertension Other     Social History   Socioeconomic History  . Marital status: Divorced    Spouse name: Not on file  . Number of children: Not on file  . Years of education: Not on file  . Highest education level: Not on file  Social Needs  . Financial resource strain: Not on file  . Food insecurity - worry: Not on file  . Food insecurity - inability: Not on file  . Transportation needs - medical: Not on file  . Transportation needs - non-medical: Not on file  Occupational History  . Occupation: unemployed    Employer: RETIRED  Tobacco Use  . Smoking status: Never Smoker  . Smokeless tobacco: Never Used  Substance and Sexual Activity  . Alcohol use: No  . Drug use: No  . Sexual activity: Not on file  Other Topics Concern  . Not on file  Social History Narrative  . Not on file    Outpatient Medications Prior to Visit  Medication Sig Dispense Refill  . celecoxib (CELEBREX) 200 MG capsule Take 1 capsule (200 mg total) by mouth daily. [place]  capsule 0  . clonazePAM (KLONOPIN) 1 MG tablet TAKE 1/2 TO 1 TABLET 3 TIMES DAILY AS NEEDED FOR ANXIETY 90 tablet 0  . DULoxetine (CYMBALTA) 60 MG capsule TAKE 1 CAPSULE (60 MG TOTAL) BY MOUTH DAILY. 90 capsule 1  . fluconazole (DIFLUCAN) 150 MG tablet Take 2 tablets (300 mg total) by mouth once a week. 4 tablet 2  . levothyroxine (SYNTHROID, LEVOTHROID) 75 MCG tablet TAKE 1 TABLET (75 MCG TOTAL) BY MOUTH DAILY except skip Saturday dose 30 tablet 1  . ondansetron (ZOFRAN) 4 MG tablet Take 1 tablet (4 mg total) by mouth every 8 (eight) hours as needed for nausea. 30 tablet 0  . rOPINIRole (REQUIP) 0.5 MG tablet Take 1-2 tablets (0.5-1 mg total) by mouth at bedtime. 60 tablet 2  . amitriptyline (ELAVIL) 25 MG tablet Take 1 tablet (25 mg total) at bedtime by mouth. 30 tablet 2  . tiZANidine (ZANAFLEX) 2 MG tablet Take 1 tablet (2 mg total) by mouth at bedtime.  30 tablet 0  . atorvastatin (LIPITOR) 10 MG tablet Take 1 tablet (10 mg total) by mouth 2 (two) times a week. (Patient not taking: Reported on 10/17/2017) 10 tablet 3  . traMADol (ULTRAM) 50 MG tablet TAKE 1 TABLET BY MOUTH EVERY 4 HOURS AS NEEDED FOR PAIN (Patient not taking: Reported on 10/17/2017) 30 tablet 0   No facility-administered medications prior to visit.     Allergies  Allergen Reactions  . Adhesive [Tape]     blisters  . Penicillins     REACTION: rash  . Promethazine Hcl Other (See Comments)    Jerky movements    Review of Systems  Constitutional: Positive for malaise/fatigue. Negative for fever.  HENT: Negative for congestion.   Eyes: Negative for blurred vision.  Respiratory: Negative for cough and shortness of breath.   Cardiovascular: Negative for chest pain, palpitations and leg swelling.  Gastrointestinal: Negative for vomiting.  Musculoskeletal: Positive for back pain and joint pain.  Skin: Negative for rash.  Neurological: Negative for loss of consciousness and headaches.  Psychiatric/Behavioral: Positive for depression.       Objective:    Physical Exam  Constitutional: She is oriented to person, place, and time. She appears well-developed and well-nourished. No distress.  HENT:  Head: Normocephalic and atraumatic.  Nose: Nose normal.  Eyes: Conjunctivae are normal. Right eye exhibits no discharge. Left eye exhibits no discharge.  Neck: Normal range of motion. Neck supple. No thyromegaly present.  Cardiovascular: Normal rate and regular rhythm.  No murmur heard. Pulmonary/Chest: Effort normal and breath sounds normal. She has no wheezes.  Abdominal: Soft. Bowel sounds are normal. There is no tenderness.  Musculoskeletal: Normal range of motion. She exhibits no edema or deformity.  Neurological: She is alert and oriented to person, place, and time.  Skin: Skin is warm and dry. She is not diaphoretic.  Psychiatric: She has a normal mood and affect.    Nursing note and vitals reviewed.   BP 124/68 (BP Location: Left Arm, Patient Position: Sitting, Cuff Size: Normal)   Pulse 93   Wt 217 lb 3.2 oz (98.5 kg)   LMP 11/14/1978   BMI 34.02 kg/m  Wt Readings from Last 3 Encounters:  10/17/17 217 lb 3.2 oz (98.5 kg)  08/29/17 227 lb 6.4 oz (103.1 kg)  08/23/17 234 lb (106.1 kg)   BP Readings from Last 3 Encounters:  10/17/17 124/68  08/29/17 120/76  08/23/17 113/71     Immunization History  Administered Date(s) Administered  .  Influenza Split 09/13/2011  . Influenza Whole 08/31/2010  . Influenza,inj,Quad PF,6+ Mos 07/25/2013  . Pneumococcal Conjugate-13 10/25/2013  . Tdap 10/25/2013    Health Maintenance  Topic Date Due  . DEXA SCAN  06/04/2011  . PNA vac Low Risk Adult (2 of 2 - PPSV23) 10/25/2014  . INFLUENZA VACCINE  02/11/2018 (Originally 06/14/2017)  . MAMMOGRAM  06/24/2019  . COLONOSCOPY  02/23/2022  . TETANUS/TDAP  10/26/2023  . Hepatitis C Screening  Completed    Lab Results  Component Value Date   WBC 8.5 08/23/2017   HGB 14.1 08/23/2017   HCT 43.0 08/23/2017   PLT 316.0 08/23/2017   GLUCOSE 91 08/23/2017   CHOL 257 (H) 08/23/2017   TRIG 131.0 08/23/2017   HDL 59.50 08/23/2017   LDLCALC 171 (H) 08/23/2017   ALT 17 08/23/2017   AST 17 08/23/2017   NA 139 08/23/2017   K 4.6 08/23/2017   CL 103 08/23/2017   CREATININE 0.94 08/23/2017   BUN 23 08/23/2017   CO2 31 08/23/2017   TSH 0.23 (L) 08/23/2017   HGBA1C 5.6 11/01/2016    Lab Results  Component Value Date   TSH 0.23 (L) 08/23/2017   Lab Results  Component Value Date   WBC 8.5 08/23/2017   HGB 14.1 08/23/2017   HCT 43.0 08/23/2017   MCV 84.9 08/23/2017   PLT 316.0 08/23/2017   Lab Results  Component Value Date   NA 139 08/23/2017   K 4.6 08/23/2017   CO2 31 08/23/2017   GLUCOSE 91 08/23/2017   BUN 23 08/23/2017   CREATININE 0.94 08/23/2017   BILITOT 0.5 08/23/2017   ALKPHOS 75 08/23/2017   AST 17 08/23/2017   ALT 17 08/23/2017    PROT 7.4 08/23/2017   ALBUMIN 4.2 08/23/2017   CALCIUM 9.4 08/23/2017   GFR 62.35 08/23/2017   Lab Results  Component Value Date   CHOL 257 (H) 08/23/2017   Lab Results  Component Value Date   HDL 59.50 08/23/2017   Lab Results  Component Value Date   LDLCALC 171 (H) 08/23/2017   Lab Results  Component Value Date   TRIG 131.0 08/23/2017   Lab Results  Component Value Date   CHOLHDL 4 08/23/2017   Lab Results  Component Value Date   HGBA1C 5.6 11/01/2016         Assessment & Plan:   Problem List Items Addressed This Visit    Obesity    Encouraged DASH diet, decrease po intake and increase exercise as tolerated. Needs 7-8 hours of sleep nightly. Avoid trans fats, eat small, frequent meals every 4-5 hours with lean proteins, complex carbs and healthy fats. Minimize simple carbs, bariatric referral      Hypothyroidism    On Levothyroxine, continue to monitor      Hyperlipidemia    They stopped her Atorvastatin and are refusing more thinking it is contributing to her pain but she had same pain prior to taking meds. Encouraged heart healthy diet, increase exercise, avoid trans fats, consider a krill oil cap daily      Depression with anxiety    Stable on current meds no changes at this time.      Relevant Medications   amitriptyline (ELAVIL) 25 MG tablet   Chronic pain syndrome    Has been referred to pain management but did not understand that physiatry is pain management so she will call them back and make an appt. No changes to meds at this time. Is no longer  taking the Tramadol. Will try Tizanidine at 4 mg which works better for her than 2 mg      Hyperglycemia    minimize simple carbs. Increase exercise as tolerated.       Insomnia    Encouraged good sleep hygiene such as dark, quiet room. No blue/green glowing lights such as computer screens in bedroom. No alcohol or stimulants in evening. Cut down on caffeine as able. Regular exercise is helpful but not  just prior to bed time. Will try Amitriptyline      Constipation    Encouraged increased hydration and fiber in diet. Daily probiotics. If bowels not moving can use MOM 2 tbls po in 4 oz of warm prune juice by mouth every 2-3 days. If no results then repeat in 4 hours with  Dulcolax suppository pr, may repeat again in 4 more hours as needed. Seek care if symptoms worsen. Consider daily Miralax and/or Dulcolax if symptoms persist.          I have discontinued Saher P. Gemmer's atorvastatin, traMADol, and tiZANidine. I have also changed her amitriptyline. Additionally, I am having her start on tiZANidine. Lastly, I am having her maintain her ondansetron, celecoxib, fluconazole, DULoxetine, rOPINIRole, levothyroxine, and clonazePAM.  Meds ordered this encounter  Medications  . amitriptyline (ELAVIL) 25 MG tablet    Sig: Take 1 tablet (25 mg total) by mouth at bedtime.    Dispense:  30 tablet    Refill:  3  . tiZANidine (ZANAFLEX) 4 MG tablet    Sig: Take 1 tablet (4 mg total) by mouth 2 (two) times daily as needed for muscle spasms.    Dispense:  60 tablet    Refill:  1    CMA served as scribe during this visit. History, Physical and Plan performed by medical provider. Documentation and orders reviewed and attested to.  Penni Homans, MD

## 2017-10-17 NOTE — Telephone Encounter (Signed)
Noted patient has appointment today w/ [facility]

## 2017-10-19 DIAGNOSIS — K5900 Constipation, unspecified: Secondary | ICD-10-CM | POA: Insufficient documentation

## 2017-10-19 NOTE — Telephone Encounter (Signed)
Refill Request: Tramadol  Last RX: Last OV: Next OV: UDS: CSC:

## 2017-10-19 NOTE — Assessment & Plan Note (Signed)
They stopped her Atorvastatin and are refusing more thinking it is contributing to her pain but she had same pain prior to taking meds. Encouraged heart healthy diet, increase exercise, avoid trans fats, consider a krill oil cap daily

## 2017-10-19 NOTE — Assessment & Plan Note (Addendum)
Has been referred to pain management but did not understand that physiatry is pain management so she will call them back and make an appt. No changes to meds at this time. Is no longer taking the Tramadol. Will try Tizanidine at 4 mg which works better for her than 2 mg

## 2017-10-19 NOTE — Assessment & Plan Note (Signed)
minimize simple carbs. Increase exercise as tolerated.  

## 2017-10-19 NOTE — Assessment & Plan Note (Signed)
Encouraged increased hydration and fiber in diet. Daily probiotics. If bowels not moving can use MOM 2 tbls po in 4 oz of warm prune juice by mouth every 2-3 days. If no results then repeat in 4 hours with  Dulcolax suppository pr, may repeat again in 4 more hours as needed. Seek care if symptoms worsen. Consider daily Miralax and/or Dulcolax if symptoms persist.  

## 2017-10-19 NOTE — Assessment & Plan Note (Signed)
Encouraged good sleep hygiene such as dark, quiet room. No blue/green glowing lights such as computer screens in bedroom. No alcohol or stimulants in evening. Cut down on caffeine as able. Regular exercise is helpful but not just prior to bed time. Will try Amitriptyline

## 2017-10-19 NOTE — Assessment & Plan Note (Signed)
On Levothyroxine, continue to monitor 

## 2017-10-19 NOTE — Assessment & Plan Note (Signed)
Encouraged DASH diet, decrease po intake and increase exercise as tolerated. Needs 7-8 hours of sleep nightly. Avoid trans fats, eat small, frequent meals every 4-5 hours with lean proteins, complex carbs and healthy fats. Minimize simple carbs, bariatric referral 

## 2017-10-19 NOTE — Assessment & Plan Note (Signed)
Stable on current meds no changes at this time.

## 2017-10-19 NOTE — Telephone Encounter (Signed)
Disregard message

## 2017-11-11 ENCOUNTER — Other Ambulatory Visit: Payer: Self-pay | Admitting: Family Medicine

## 2017-11-13 ENCOUNTER — Other Ambulatory Visit: Payer: Self-pay

## 2017-11-13 MED ORDER — CLONAZEPAM 1 MG PO TABS: ORAL_TABLET | ORAL | 0 refills | Status: DC

## 2017-11-13 NOTE — Telephone Encounter (Signed)
Requesting: ClonazePAM 1 MG Contract: 02/14/17 UDS: 08/16/17 MODERATE RISK Last OV: 10/17/17 Next OV: 02/15/18 Last Refill: 10/09/17 #[id number] 0rf   Please advise

## 2017-11-13 NOTE — Telephone Encounter (Signed)
Needs to repeat UDS then can have refill

## 2017-11-13 NOTE — Telephone Encounter (Signed)
Called pt 3 times no answer left voicemail

## 2017-11-13 NOTE — Addendum Note (Signed)
Addended by: Bartholome Bill on: 11/13/2017 09:56 AM   Modules accepted: Orders

## 2017-11-15 ENCOUNTER — Other Ambulatory Visit: Payer: Self-pay | Admitting: Family Medicine

## 2017-11-15 MED ORDER — TIZANIDINE HCL 4 MG PO TABS: 4.0000 mg | ORAL_TABLET | Freq: Two times a day (BID) | ORAL | 1 refills | Status: DC | PRN

## 2017-11-15 NOTE — Telephone Encounter (Signed)
Last filled per database: 10/09/17  Last written: 10/09/17 Last ov: 10/17/17 Next ov: 02/15/18 Contract: Yes  UDS: Kayla [name]

## 2017-11-16 ENCOUNTER — Other Ambulatory Visit: Payer: Self-pay | Admitting: Family Medicine

## 2017-11-17 ENCOUNTER — Other Ambulatory Visit: Payer: Medicare Other

## 2017-11-17 DIAGNOSIS — Z79899 Other long term (current) drug therapy: Secondary | ICD-10-CM

## 2017-11-21 LAB — PAIN MGMT, PROFILE 8 W/CONF, U
6 Acetylmorphine: NEGATIVE ng/mL (ref <10)
ALPHAHYDROXYALPRAZOLAM: NEGATIVE ng/mL (ref <25)
ALPHAHYDROXYMIDAZOLAM: NEGATIVE ng/mL (ref <50)
Alcohol Metabolites: NEGATIVE ng/mL (ref <500)
Alphahydroxytriazolam: NEGATIVE ng/mL (ref <50)
Amphetamines: NEGATIVE ng/mL (ref <500)
BENZODIAZEPINES: POSITIVE ng/mL — AB (ref <100)
BUPRENORPHINE, URINE: NEGATIVE ng/mL (ref <5)
Cocaine Metabolite: NEGATIVE ng/mL (ref <150)
Creatinine: 156 mg/dL
Hydroxyethylflurazepam: NEGATIVE ng/mL (ref <50)
LORAZEPAM: NEGATIVE ng/mL (ref <50)
MARIJUANA METABOLITE: NEGATIVE ng/mL (ref <20)
MDMA: NEGATIVE ng/mL (ref <500)
Nordiazepam: NEGATIVE ng/mL (ref <50)
Opiates: NEGATIVE ng/mL (ref <100)
Oxazepam: NEGATIVE ng/mL (ref <50)
Oxidant: NEGATIVE ug/mL (ref <200)
Oxycodone: NEGATIVE ng/mL (ref <100)
TEMAZEPAM: NEGATIVE ng/mL (ref <50)
pH: 5.57 (ref 4.5–9.0)

## 2017-11-28 ENCOUNTER — Other Ambulatory Visit: Payer: Self-pay | Admitting: Family Medicine

## 2017-12-06 ENCOUNTER — Other Ambulatory Visit: Payer: Self-pay | Admitting: Family Medicine

## 2017-12-14 ENCOUNTER — Encounter: Payer: Self-pay | Admitting: Psychology

## 2018-01-04 ENCOUNTER — Other Ambulatory Visit: Payer: Self-pay | Admitting: Family Medicine

## 2018-01-05 ENCOUNTER — Encounter: Payer: Medicare Other | Admitting: Physical Medicine & Rehabilitation

## 2018-01-10 ENCOUNTER — Ambulatory Visit: Payer: Self-pay | Admitting: *Deleted

## 2018-01-10 NOTE — Telephone Encounter (Signed)
Appt w/ Percell Miller tomorrow.

## 2018-01-10 NOTE — Telephone Encounter (Signed)
Patient states she has had pain in her glands under her arms and behind her knees. She also complains of pain in her hips- groin area.  She is wondering about arthritis. Patient has not had her appointment with the pain clinic yet. Patient is not sleeping and is miserable.Patient was using Amitriptyline for sleep- 3-4 tablets were what was working- she only used occasionally. Patient does not mix her medications. She uses her other medications- switching and trying to change up. Patient is in despair over what is going on in her body- she is fighting depression over this. The pain in her glands is keeping her up at night and effecting her ability to stand and walk.  Reason for Disposition . Nursing judgment or information in reference  Answer Assessment - Initial Assessment Questions 1. REASON FOR CALL: "What is your main concern right now?"     Swelling and pain in glands 2. ONSET: "When did the ___ start?"     On going 3. SEVERITY: "How bad is the ___?"     Annoying- always aware- hurts to lay on right 4. FEVER: "Do you have a fever?"     no 5. OTHER SYMPTOMS: "Do you have any other new symptoms?"     no 6. INTERVENTIONS AND RESPONSE: "What have you done so far to try to make this better? What medications have you used?"     Tramadol helped a little with discomfort 7. PREGNANCY: "Is there any chance you are pregnant?"     n/a  Protocols used: NO GUIDELINE AVAILABLE-A-AH

## 2018-01-11 ENCOUNTER — Other Ambulatory Visit: Payer: Self-pay | Admitting: Family Medicine

## 2018-01-11 ENCOUNTER — Encounter: Payer: Self-pay | Admitting: Medical

## 2018-01-11 ENCOUNTER — Ambulatory Visit (INDEPENDENT_AMBULATORY_CARE_PROVIDER_SITE_OTHER): Payer: PPO | Admitting: Medical

## 2018-01-11 VITALS — BP 137/85 | HR 89 | Temp 98.2°F | Resp 16 | Ht 67.0 in | Wt 225.8 lb

## 2018-01-11 DIAGNOSIS — M797 Fibromyalgia: Secondary | ICD-10-CM

## 2018-01-11 DIAGNOSIS — G2581 Restless legs syndrome: Secondary | ICD-10-CM

## 2018-01-11 DIAGNOSIS — B379 Candidiasis, unspecified: Secondary | ICD-10-CM

## 2018-01-11 DIAGNOSIS — M25551 Pain in right hip: Secondary | ICD-10-CM | POA: Diagnosis not present

## 2018-01-11 DIAGNOSIS — M25552 Pain in left hip: Secondary | ICD-10-CM

## 2018-01-11 DIAGNOSIS — G4700 Insomnia, unspecified: Secondary | ICD-10-CM | POA: Diagnosis not present

## 2018-01-11 DIAGNOSIS — M25559 Pain in unspecified hip: Secondary | ICD-10-CM | POA: Diagnosis not present

## 2018-01-11 DIAGNOSIS — F419 Anxiety disorder, unspecified: Secondary | ICD-10-CM | POA: Diagnosis not present

## 2018-01-11 MED ORDER — ROPINIROLE HCL 0.5 MG PO TABS: 0.5000 mg | ORAL_TABLET | Freq: Every day | ORAL | 0 refills | Status: DC

## 2018-01-11 MED ORDER — HYDROXYZINE HCL 10 MG PO TABS: ORAL_TABLET | ORAL | 0 refills | Status: DC

## 2018-01-11 MED ORDER — AMITRIPTYLINE HCL 75 MG PO TABS: 75.0000 mg | ORAL_TABLET | Freq: Every day | ORAL | 3 refills | Status: DC

## 2018-01-11 MED ORDER — CELECOXIB 200 MG PO CAPS: 200.0000 mg | ORAL_CAPSULE | Freq: Every day | ORAL | 0 refills | Status: DC

## 2018-01-11 NOTE — Progress Notes (Signed)
Subjective:    Patient ID: Kayla [name], female    DOB: 12-Mar-1946, 72 y.o.   MRN: [id number]  HPI  Pt in for follow up.  She states she missed her pain management appointment. They resheduled her for end of march. She has bilateral groin pain and bilateral hip pain. Pain that shoots down her legs.  Pt also has some pain in rt upper pectoralis and rt axillary area. Pt on 06-26-2017 had negative mammogram. This is known chronic condition.   Also has some pain in her antecubial fossae, knees and thumbs  Pt does have history of fibromyalgia.   Pt was given elavil by Dr. Charlett Blake recently. She is also on klonopin,cymbalata and zanaflex.  Pt states she missed appointment with her psychologist.  Pt stats needs refill of elavil. She thinks needs higher dose such as 75 mg. But even then does not sleep like she expects/wants.  Occasionally to sleep well admit to taking 3 elevil and 2 clonopin to help her sleep.   Review of Systems  Constitutional: Negative for chills, fatigue and fever.  HENT: Negative for congestion, drooling and ear discharge.   Respiratory: Negative for cough, chest tightness, shortness of breath and wheezing.   Cardiovascular: Negative for chest pain and palpitations.  Gastrointestinal: Negative for abdominal pain, constipation and diarrhea.  Musculoskeletal: Positive for joint swelling and myalgias. Negative for arthralgias, back pain and neck stiffness.       See hpi  Skin: Negative for rash.  Neurological: Negative for dizziness, seizures, speech difficulty, weakness, numbness and headaches.  Hematological: Negative for adenopathy. Does not bruise/bleed easily.  Psychiatric/Behavioral: Positive for sleep disturbance. Negative for behavioral problems, confusion and hallucinations. The patient is nervous/anxious.     Past Medical History:  Diagnosis Date  . Allergy    allergic rhinitis  . Anemia    NOS  . Arthritis    osteoarthritis  . Asthma   .  Degenerative disc disease, lumbar 12/13/2015  . Depression   . Depression with anxiety 01/19/2009   Qualifier: Diagnosis of  By: Redmond Pulling MD, Frann Rider    . Fibromyalgia   . GERD (gastroesophageal reflux disease)   . Hair loss 11/01/2016  . Headache(784.0)   . History of diverticulitis of colon   . Hx of colonic polyps   . Hyperglycemia 11/23/2014  . Hyperlipidemia   . Medicare annual wellness visit, subsequent 11/01/2016  . Migraine   . Mild cognitive impairment 04/29/2016  . Miscarriage   . Obesity, unspecified 10/26/2013  . Otitis, externa, infective 11/23/2014  . Restless leg 08/29/2017  . Thyroid disease    hypothyroidism  . Urinary incontinence      Social History   Socioeconomic History  . Marital status: Divorced    Spouse name: Not on file  . Number of children: Not on file  . Years of education: Not on file  . Highest education level: Not on file  Social Needs  . Financial resource strain: Not on file  . Food insecurity - worry: Not on file  . Food insecurity - inability: Not on file  . Transportation needs - medical: Not on file  . Transportation needs - non-medical: Not on file  Occupational History  . Occupation: unemployed    Employer: RETIRED  Tobacco Use  . Smoking status: Never Smoker  . Smokeless tobacco: Never Used  Substance and Sexual Activity  . Alcohol use: No  . Drug use: No  . Sexual activity: Not on file  Other Topics  Concern  . Not on file  Social History Narrative  . Not on file    Past Surgical History:  Procedure Laterality Date  . ABDOMINAL HYSTERECTOMY    . BLADDER SUSPENSION    . CHOLECYSTECTOMY    . TONSILLECTOMY      Family History  Problem Relation Age of Onset  . Alzheimer's disease Mother   . Alzheimer's disease Father   . Cancer Other        female, brain, lung  . Heart disease Other   . Stroke Other   . Diabetes Other   . Arthritis Other   . Hyperlipidemia Other   . Hypertension Other     Allergies  Allergen  Reactions  . Adhesive [Tape]     blisters  . Penicillins     REACTION: rash  . Promethazine Hcl Other (See Comments)    Jerky movements    Current Outpatient Medications on File Prior to Visit  Medication Sig Dispense Refill  . amitriptyline (ELAVIL) 25 MG tablet Take 1 tablet (25 mg total) by mouth at bedtime. 30 tablet 3  . celecoxib (CELEBREX) 200 MG capsule Take 1 capsule (200 mg total) by mouth daily. 14 capsule 0  . clonazePAM (KLONOPIN) 1 MG tablet TAKE 1/2 TO 1 TABLET BY MOUTH 3 TIMES DAILY AS NEEDED FPR ANXIETY 90 tablet 0  . DULoxetine (CYMBALTA) 60 MG capsule TAKE 1 CAPSULE (60 MG TOTAL) BY MOUTH DAILY. 90 capsule 1  . fluconazole (DIFLUCAN) 150 MG tablet Take 2 tablets (300 mg total) by mouth once a week. 4 tablet 2  . levothyroxine (SYNTHROID, LEVOTHROID) 75 MCG tablet TAKE 1 TABLET BY MOUTH EVERY DAY 30 tablet 0  . ondansetron (ZOFRAN) 4 MG tablet Take 1 tablet (4 mg total) by mouth every 8 (eight) hours as needed for nausea. 30 tablet 0  . rOPINIRole (REQUIP) 0.5 MG tablet TAKE 1-2 TABLETS (0.5-1 MG TOTAL) BY MOUTH AT BEDTIME. 60 tablet 0  . tiZANidine (ZANAFLEX) 4 MG tablet Take 1 tablet (4 mg total) by mouth 2 (two) times daily as needed for muscle spasms. 60 tablet 1   No current facility-administered medications on file prior to visit.     BP 137/85   Pulse 89   Temp 98.2 F (36.8 C) (Oral)   Resp 16   Ht 5\' 7"  (1.702 m)   Wt 225 lb 12.8 oz (102.4 kg)   LMP 11/14/1978   SpO2 97%   BMI 35.37 kg/m       Objective:   Physical Exam  General Mental Status- Alert. General Appearance- Not in acute distress.   Skin General: Color- Normal Color. Moisture- Normal Moisture.  Neck Carotid Arteries- Normal color. Moisture- Normal Moisture. No carotid bruits. No JVD.  Chest and Lung Exam Auscultation: Breath Sounds:-Normal.  Cardiovascular Auscultation:Rythm- Regular. Murmurs & Other Heart Sounds:Auscultation of the heart reveals- No  Murmurs.  Abdomen Inspection:-Inspeection Normal. Palpation/Percussion:Note:No mass. Palpation and Percussion of the abdomen reveal- Non Tender, Non Distended + BS, no rebound or guarding.   Neurologic Cranial Nerve exam:- CN III-XII intact(No nystagmus), symmetric smile. Drift Test:- No drift. Romberg Exam:- Negative.  Heal to Toe Gait exam:-Normal. Finger to Nose:- Normal/Intact Strength:- 5/5 equal and symmetric strength both upper and lower extremities.      Assessment & Plan:  For your diffuse joint pains and body aches, I did place arthritis panel studies today.  Please get those done.  You do have fibromyalgia but will also follow these labs to see  if you have overlapping condition.  For the above pains, I am refilling your Celebrex prescription.  You could also use Tylenol to help reduce the pain.  For anxiety, continue with Klonopin. For neuropathy and insomnia, Elavil at higher dose may help.  Discussed this with Dr. Charlett Blake and she is okay with you taking 75 mg nightly.  Also to help with insomnia I did prescribe you low-dose hydroxyzine.  You can take 1-2 tablets at night if needed.  Would recommend the lower amount at first.  I think you will be better for you to take hydroxyzine rather than doubling up on your Klonopin which she reports doing.  I did refill your Requip today.  Follow-up as greatly scheduled with your PCP or as needed with myself.  Note did counsel with patient today our hesitation in giving narcotics along with benzodiazepines thus we are trying to find regimen that would help with her pain but also reduce risk of overdose.  The patient does have appointment with pain management at the end of March.  40 minutes spent with patient. 50% of time spent counseling and discussing plan for varied conditions/medication management and potential interaction of various meds.

## 2018-01-11 NOTE — Patient Instructions (Signed)
For your diffuse joint pains and body aches, I did place arthritis panel studies today.  Please get those done.  You do have fibromyalgia but will also follow these labs to see if you have overlapping condition.  For the above pains, I am refilling your Celebrex prescription.  You could also use Tylenol to help reduce the pain.  For anxiety, continue with Klonopin. For neuropathy and insomnia, Elavil at higher dose may help.  Discussed this with Dr. Charlett Blake and she is okay with you taking 75 mg nightly.  Also to help with insomnia I did prescribe you low-dose hydroxyzine.  You can take 1-2 tablets at night if needed.  Would recommend the lower amount at first.  I think you will be better for you to take hydroxyzine rather than doubling up on your Klonopin which she reports doing.  I did refill your Requip today.  Follow-up as greatly scheduled with your PCP or as needed with myself.

## 2018-01-12 LAB — ANA: Anti Nuclear Antibody(ANA): NEGATIVE

## 2018-01-12 LAB — SEDIMENTATION RATE: SED RATE: 8 mm/h (ref 0–30)

## 2018-01-12 LAB — C-REACTIVE PROTEIN: CRP: 0.7 mg/dL (ref 0.5–20.0)

## 2018-01-12 LAB — RHEUMATOID FACTOR: Rhuematoid fact SerPl-aCnc: 53 IU/mL — ABNORMAL HIGH (ref <14)

## 2018-01-15 ENCOUNTER — Telehealth: Payer: Self-pay | Admitting: Medical

## 2018-01-15 DIAGNOSIS — R768 Other specified abnormal immunological findings in serum: Secondary | ICD-10-CM

## 2018-01-15 DIAGNOSIS — M2550 Pain in unspecified joint: Secondary | ICD-10-CM

## 2018-01-23 ENCOUNTER — Other Ambulatory Visit: Payer: Self-pay | Admitting: Family Medicine

## 2018-01-23 NOTE — Telephone Encounter (Signed)
Needs a contract update at next visit and uds

## 2018-01-23 NOTE — Telephone Encounter (Signed)
Requesting: clonazepam Contract: due 02/14/18 UDS: due Last OV: 10/17/17 Next OV: 02/15/18 Last Refill: 11/15/17 Database: no concerns   Please advise

## 2018-01-25 ENCOUNTER — Other Ambulatory Visit: Payer: Self-pay | Admitting: Family Medicine

## 2018-01-26 ENCOUNTER — Other Ambulatory Visit: Payer: Self-pay | Admitting: Medical

## 2018-01-29 ENCOUNTER — Other Ambulatory Visit: Payer: Self-pay | Admitting: Family Medicine

## 2018-01-31 ENCOUNTER — Other Ambulatory Visit: Payer: Self-pay | Admitting: Family Medicine

## 2018-02-02 ENCOUNTER — Telehealth: Payer: Self-pay

## 2018-02-05 ENCOUNTER — Other Ambulatory Visit: Payer: Self-pay | Admitting: Family Medicine

## 2018-02-05 ENCOUNTER — Telehealth: Payer: Self-pay

## 2018-02-05 NOTE — Telephone Encounter (Signed)
PA initiated via Covermymeds; KEY: [id number]. Awaiting determination.

## 2018-02-06 NOTE — Telephone Encounter (Signed)
PA approved.   PA Case: [id number], Status: Approved, Coverage Starts on: 02/05/2018 12:00:00 AM, Coverage Ends on: 11/13/2018 12:00:00 AM.

## 2018-02-07 ENCOUNTER — Other Ambulatory Visit: Payer: Self-pay | Admitting: Medical

## 2018-02-07 ENCOUNTER — Encounter: Payer: PPO | Admitting: Physical Medicine & Rehabilitation

## 2018-02-07 MED ORDER — TIZANIDINE HCL 4 MG PO TABS: 4.0000 mg | ORAL_TABLET | Freq: Two times a day (BID) | ORAL | 1 refills | Status: DC | PRN

## 2018-02-09 NOTE — Telephone Encounter (Signed)
[facility] calling and checking on status of Ropinirole and Celecoxib.

## 2018-02-12 ENCOUNTER — Encounter: Payer: Self-pay | Admitting: Psychology

## 2018-02-15 ENCOUNTER — Ambulatory Visit: Payer: PPO | Admitting: Family Medicine

## 2018-02-21 ENCOUNTER — Encounter: Payer: PPO | Attending: Physical Medicine & Rehabilitation | Admitting: Physical Medicine & Rehabilitation

## 2018-02-28 NOTE — Telephone Encounter (Signed)
Error

## 2018-03-02 ENCOUNTER — Other Ambulatory Visit: Payer: Self-pay | Admitting: Family Medicine

## 2018-03-05 NOTE — Telephone Encounter (Signed)
Requesting:KLONOPIN 1 MG Contract:02/24/17 UDS:11/27/17 Moderate risk Last OV:01/11/18 Next OV:- Last Refill: 01/23/18   #90   Please advise

## 2018-03-05 NOTE — Telephone Encounter (Signed)
Rx refilled on behalf of pcp.

## 2018-03-07 ENCOUNTER — Other Ambulatory Visit: Payer: Self-pay | Admitting: Family Medicine

## 2018-03-09 ENCOUNTER — Telehealth: Payer: Self-pay | Admitting: Family Medicine

## 2018-03-09 NOTE — Telephone Encounter (Signed)
Copied from [place]. Topic: Quick Communication - Rx Refill/Question >> Mar 09, 2018 11:37 AM Yvette Rack wrote: Medication: levothyroxine (SYNTHROID, LEVOTHROID) 75 MCG tablet Has the patient contacted their pharmacy? Yes.  Pharmacy called (Agent: If no, request that the patient contact the pharmacy for the refill.) Preferred Pharmacy (with phone number or street name): [id number] [place], [address] AT [place] [phone] (Phone) [phone] (Fax) Agent: Please be advised that RX refills may take up to 3 business days. We ask that you follow-up with your pharmacy.

## 2018-03-10 ENCOUNTER — Other Ambulatory Visit: Payer: Self-pay | Admitting: Family Medicine

## 2018-03-10 MED ORDER — LEVOTHYROXINE SODIUM 75 MCG PO TABS: 75.0000 ug | ORAL_TABLET | Freq: Every day | ORAL | 0 refills | Status: DC

## 2018-04-05 ENCOUNTER — Other Ambulatory Visit: Payer: Self-pay | Admitting: Family Medicine

## 2018-04-10 ENCOUNTER — Other Ambulatory Visit: Payer: Self-pay | Admitting: Family Medicine

## 2018-04-11 ENCOUNTER — Other Ambulatory Visit: Payer: Self-pay | Admitting: Family Medicine

## 2018-04-11 DIAGNOSIS — M79641 Pain in right hand: Secondary | ICD-10-CM | POA: Diagnosis not present

## 2018-04-11 DIAGNOSIS — Z6837 Body mass index (BMI) 37.0-37.9, adult: Secondary | ICD-10-CM | POA: Diagnosis not present

## 2018-04-11 DIAGNOSIS — R768 Other specified abnormal immunological findings in serum: Secondary | ICD-10-CM | POA: Diagnosis not present

## 2018-04-11 DIAGNOSIS — E669 Obesity, unspecified: Secondary | ICD-10-CM | POA: Diagnosis not present

## 2018-04-11 DIAGNOSIS — M79642 Pain in left hand: Secondary | ICD-10-CM | POA: Diagnosis not present

## 2018-04-11 DIAGNOSIS — M2550 Pain in unspecified joint: Secondary | ICD-10-CM | POA: Diagnosis not present

## 2018-04-12 NOTE — Telephone Encounter (Signed)
Requesting:  KLONOPIN 1 MG Contract: 02/24/17 UDS: 11/27/17 Moderate risk Last OV: 01/11/18 Next OV:- Last Refill: 03/05/18   Please advise

## 2018-04-19 IMAGING — DX DG SACRUM/COCCYX 2+V
3 series · 3 of 3 positions shown · non-contrast
Comparison: None.

CLINICAL DATA: Fall, injury a sacrum and coccyx 2 weeks ago.  Pain.

EXAM:
SACRUM AND COCCYX - 2+ VIEW

[coccyx ap]
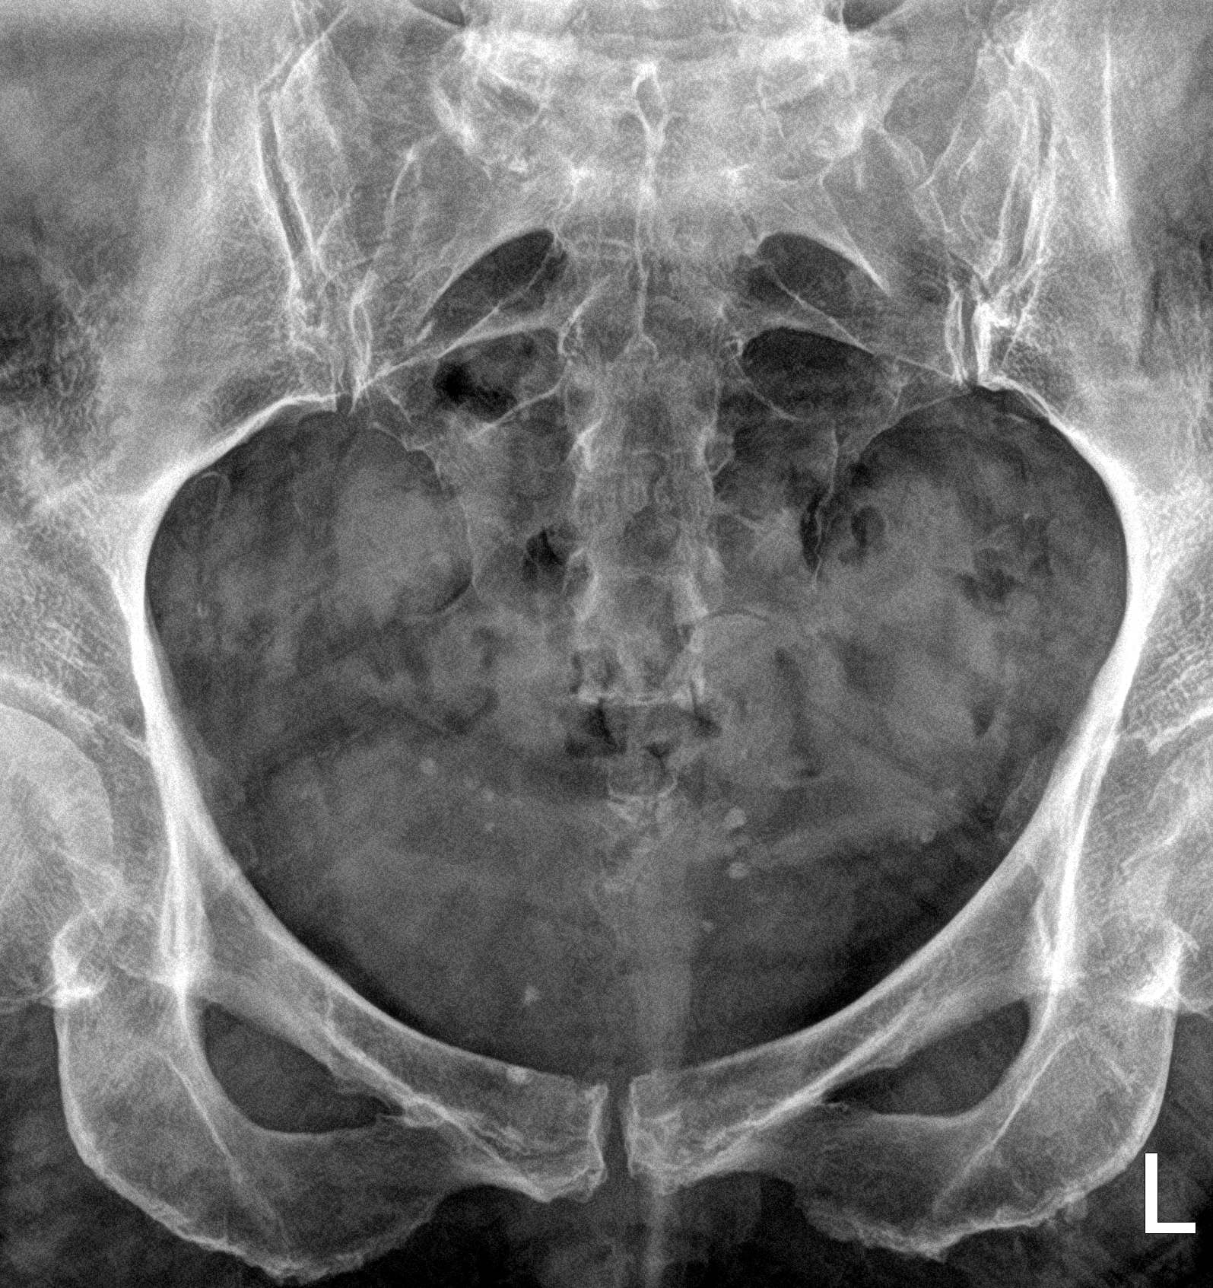

[sacrum ap]
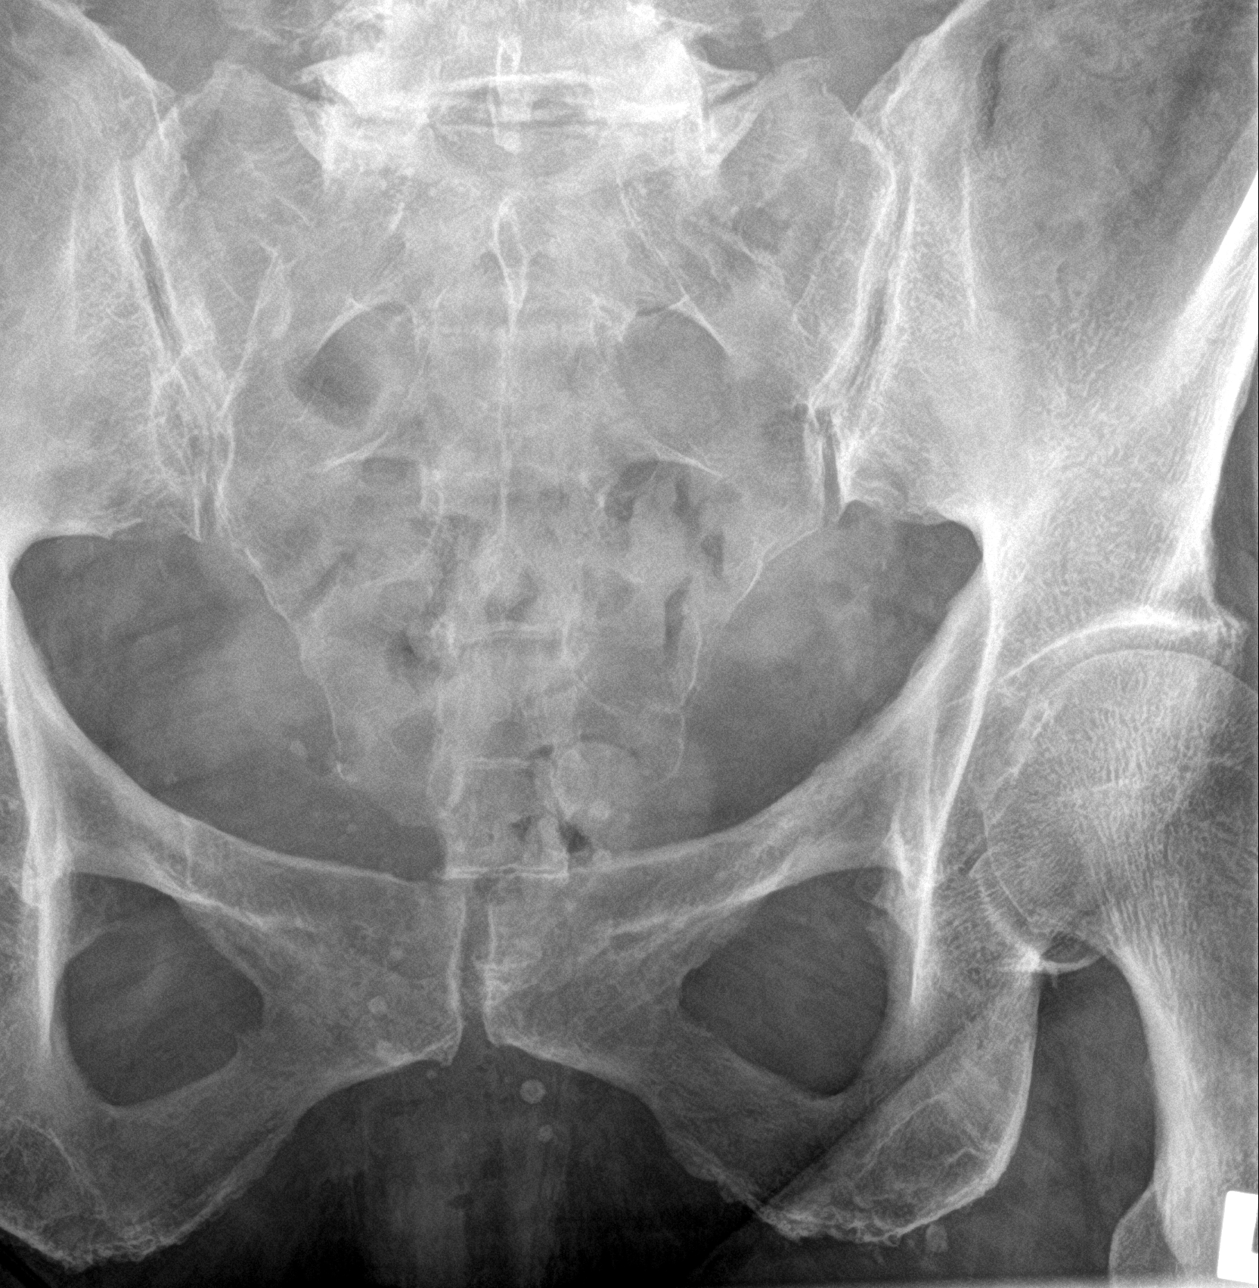

[sacrum lat]
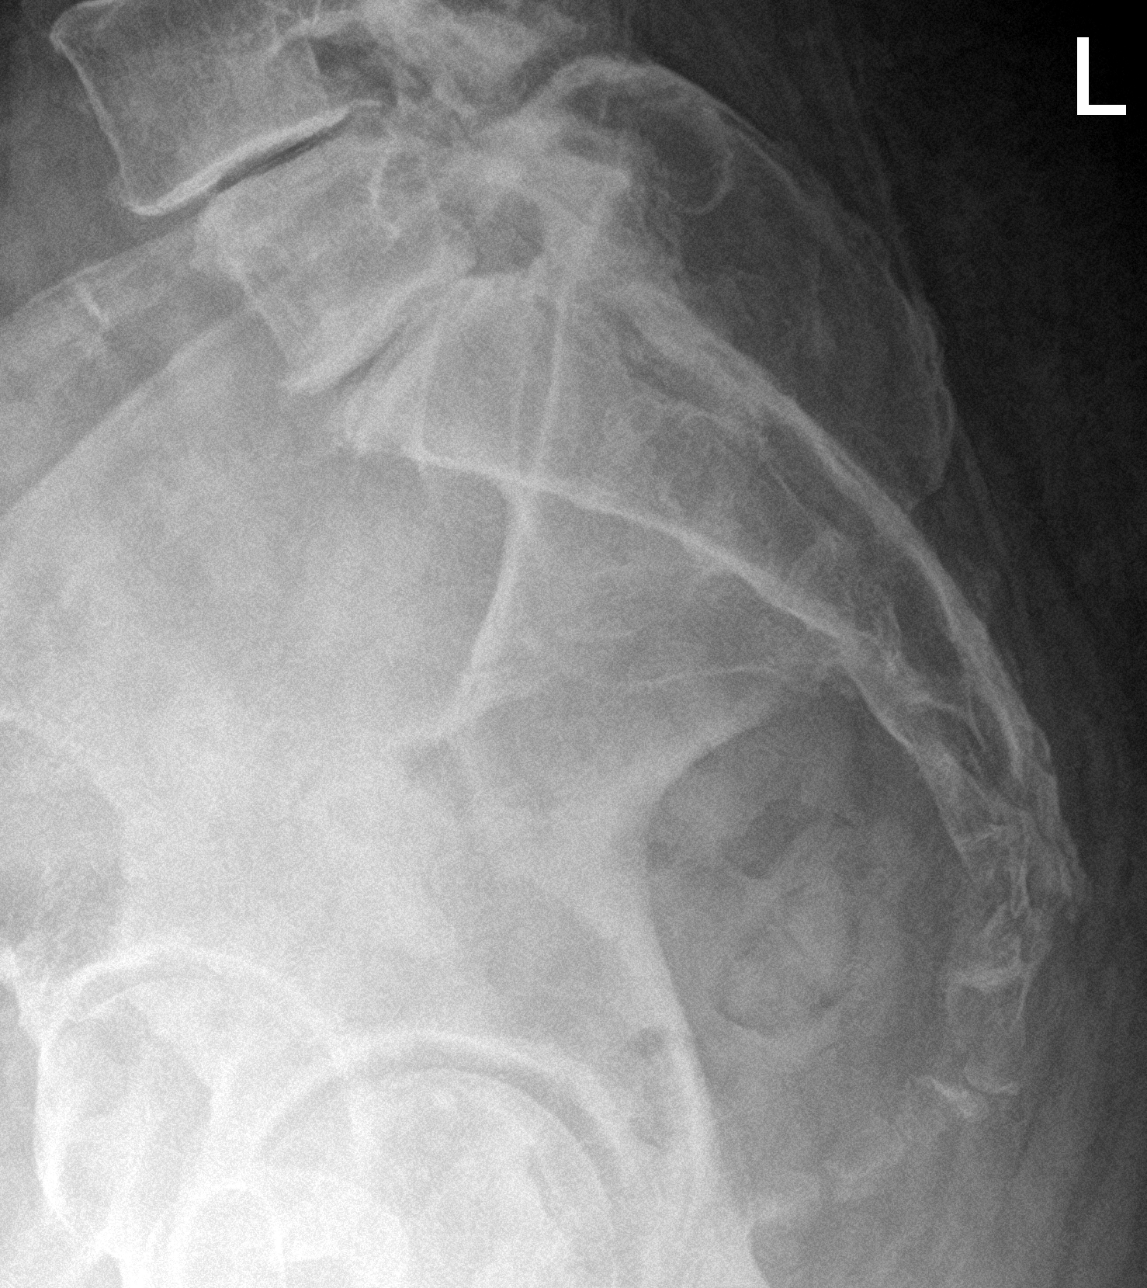

[3 of 3 positions shown; findings below may reference images not displayed]

FINDINGS: The SI joints are symmetric and unremarkable. Irregular lucency
through the upper coccyx concerning for fracture. No visible sacral
abnormality..
IMPRESSION: Findings concerning for coccygeal fracture.

## 2018-04-28 ENCOUNTER — Other Ambulatory Visit: Payer: Self-pay | Admitting: Family Medicine

## 2018-04-30 ENCOUNTER — Other Ambulatory Visit: Payer: Self-pay | Admitting: Medical

## 2018-05-02 ENCOUNTER — Other Ambulatory Visit: Payer: Self-pay | Admitting: Medical

## 2018-05-05 ENCOUNTER — Other Ambulatory Visit: Payer: Self-pay | Admitting: Family Medicine

## 2018-05-10 ENCOUNTER — Other Ambulatory Visit: Payer: Self-pay | Admitting: Family Medicine

## 2018-05-13 ENCOUNTER — Other Ambulatory Visit: Payer: Self-pay | Admitting: Family Medicine

## 2018-05-15 NOTE — Telephone Encounter (Signed)
OK to refill once UDS and contract done so I will print. She needs appt for any further refills

## 2018-05-15 NOTE — Telephone Encounter (Signed)
Requesting: clonazepam  Contract: need UDS: due Last OV: 10/17/17 Next OV: none Last Refill: 04/12/18 Database: no discrepancies    Please advise

## 2018-05-17 ENCOUNTER — Other Ambulatory Visit: Payer: Self-pay | Admitting: Family Medicine

## 2018-05-22 ENCOUNTER — Other Ambulatory Visit: Payer: Self-pay | Admitting: Family Medicine

## 2018-05-22 DIAGNOSIS — Z79899 Other long term (current) drug therapy: Secondary | ICD-10-CM

## 2018-05-22 NOTE — Telephone Encounter (Signed)
Princess -- please see 05/13/18 refill note and contact pt.

## 2018-05-22 NOTE — Telephone Encounter (Signed)
Copied from [place] [id number]. Topic: Quick Communication - Rx Refill/Question >> May 22, 2018  3:41 PM Bea Graff, NT wrote: Medication: celecoxib (CELEBREX) 200 MG capsule  Has the patient contacted their pharmacy? Yes.   (Agent: If no, request that the patient contact the pharmacy for the refill.) (Agent: If yes, when and what did the pharmacy advise?)  Preferred Pharmacy (with phone number or street name): [id number] [place], [address] AT [place] [phone] (Phone) [phone] (Fax)      Agent: Please be advised that RX refills may take up to 3 business days. We ask that you follow-up with your pharmacy.

## 2018-05-22 NOTE — Telephone Encounter (Signed)
[facility] NOT [facility]  clonazePAM (KLONOPIN) 1 MG tablet  Pt called a week ago and is getting worried. Can you resend?  [id number] [place], [place] - [address] AT [place] [phone] (Phone) [phone] (Fax)

## 2018-05-23 NOTE — Telephone Encounter (Signed)
Refill of Celebrex  LRF 05/07/18   #30   0 refills  LOV 01/11/18 [address]   [facility] [phone] [address]

## 2018-05-23 NOTE — Telephone Encounter (Signed)
Per Dr. Frederik Pear note on 6/30, pt. Needs a UDS and contract signed prior to refilling clonazepam. Pt. already has an appointment with Dr. Charlett Blake 7/18. Author phoned pt. to set up a lab appointment to get required items, but no answer, unable to leave VM. Author then attempted to reach out to daughter, Judson Roch, but person who answered phone said it was a wrong number. Routed to [place], to attempt to contact pt. again.

## 2018-05-24 ENCOUNTER — Other Ambulatory Visit: Payer: Self-pay | Admitting: Family Medicine

## 2018-05-24 ENCOUNTER — Other Ambulatory Visit (INDEPENDENT_AMBULATORY_CARE_PROVIDER_SITE_OTHER): Payer: PPO

## 2018-05-24 DIAGNOSIS — Z79899 Other long term (current) drug therapy: Secondary | ICD-10-CM | POA: Diagnosis not present

## 2018-05-24 NOTE — Telephone Encounter (Signed)
Spoke with patient she will come in the office today to leave sample.

## 2018-05-25 NOTE — Telephone Encounter (Signed)
Author phoned pt. X 2 re: UDS and CSC need. Author left VM to call back [phone] to make a lab appointment prior to 7/18 appointment with Dr. Charlett Blake.

## 2018-05-27 LAB — PAIN MGMT, PROFILE 8 W/CONF, U
6 ACETYLMORPHINE: NEGATIVE ng/mL (ref <10)
ALCOHOL METABOLITES: NEGATIVE ng/mL (ref <500)
ALPHAHYDROXYMIDAZOLAM: NEGATIVE ng/mL (ref <50)
ALPHAHYDROXYTRIAZOLAM: NEGATIVE ng/mL (ref <50)
Alphahydroxyalprazolam: NEGATIVE ng/mL (ref <25)
Aminoclonazepam: 1372 ng/mL — ABNORMAL HIGH (ref <25)
Amphetamines: NEGATIVE ng/mL (ref <500)
Benzodiazepines: POSITIVE ng/mL — AB (ref <100)
Buprenorphine, Urine: NEGATIVE ng/mL (ref <5)
COCAINE METABOLITE: NEGATIVE ng/mL (ref <150)
CREATININE: 158.1 mg/dL
Hydroxyethylflurazepam: NEGATIVE ng/mL (ref <50)
Lorazepam: NEGATIVE ng/mL (ref <50)
MARIJUANA METABOLITE: NEGATIVE ng/mL (ref <20)
MDMA: NEGATIVE ng/mL (ref <500)
Nordiazepam: NEGATIVE ng/mL (ref <50)
OXAZEPAM: NEGATIVE ng/mL (ref <50)
OXYCODONE: NEGATIVE ng/mL (ref <100)
Opiates: NEGATIVE ng/mL (ref <100)
Oxidant: NEGATIVE ug/mL (ref <200)
Temazepam: NEGATIVE ng/mL (ref <50)
pH: 5.19 (ref 4.5–9.0)

## 2018-05-31 ENCOUNTER — Other Ambulatory Visit (HOSPITAL_COMMUNITY)
Admission: RE | Admit: 2018-05-31 | Discharge: 2018-05-31 | Disposition: A | Discharge status: Home / Self Care | Payer: PPO | Source: Ambulatory Visit | Attending: Family Medicine | Admitting: Family Medicine

## 2018-05-31 ENCOUNTER — Ambulatory Visit (INDEPENDENT_AMBULATORY_CARE_PROVIDER_SITE_OTHER): Payer: PPO | Admitting: Family Medicine

## 2018-05-31 VITALS — BP 127/82 | HR 74 | Temp 98.6°F | Resp 18 | Wt 226.2 lb

## 2018-05-31 DIAGNOSIS — B9689 Other specified bacterial agents as the cause of diseases classified elsewhere: Secondary | ICD-10-CM | POA: Insufficient documentation

## 2018-05-31 DIAGNOSIS — R739 Hyperglycemia, unspecified: Secondary | ICD-10-CM

## 2018-05-31 DIAGNOSIS — D649 Anemia, unspecified: Secondary | ICD-10-CM | POA: Diagnosis not present

## 2018-05-31 DIAGNOSIS — E039 Hypothyroidism, unspecified: Secondary | ICD-10-CM

## 2018-05-31 DIAGNOSIS — R238 Other skin changes: Secondary | ICD-10-CM | POA: Diagnosis not present

## 2018-05-31 DIAGNOSIS — B379 Candidiasis, unspecified: Secondary | ICD-10-CM

## 2018-05-31 DIAGNOSIS — N761 Subacute and chronic vaginitis: Secondary | ICD-10-CM | POA: Diagnosis not present

## 2018-05-31 DIAGNOSIS — E782 Mixed hyperlipidemia: Secondary | ICD-10-CM

## 2018-05-31 DIAGNOSIS — M6281 Muscle weakness (generalized): Secondary | ICD-10-CM

## 2018-05-31 MED ORDER — FLUCONAZOLE 150 MG PO TABS
300.0000 mg | ORAL_TABLET | ORAL | 1 refills | Status: DC
Start: 2018-05-31 — End: 2018-12-03

## 2018-05-31 MED ORDER — TRAZODONE HCL 50 MG PO TABS: 25.0000 mg | ORAL_TABLET | Freq: Every evening | ORAL | 3 refills | Status: DC | PRN

## 2018-05-31 MED ORDER — DULOXETINE HCL 60 MG PO CPEP: ORAL_CAPSULE | ORAL | 1 refills | Status: DC

## 2018-05-31 MED ORDER — ESTROGENS, CONJUGATED 0.625 MG/GM VA CREA: 1.0000 | TOPICAL_CREAM | VAGINAL | 3 refills | Status: DC

## 2018-05-31 MED ORDER — LEVOTHYROXINE SODIUM 75 MCG PO TABS: 75.0000 ug | ORAL_TABLET | Freq: Every day | ORAL | 1 refills | Status: DC

## 2018-05-31 NOTE — Progress Notes (Signed)
Subjective:  I acted as a Education administrator for Dr. Charlett Blake. [place], [place]  Patient ID: Kayla [name], female    DOB: 1945/11/20, 72 y.o.   MRN: [id number]  No chief complaint on file.   HPI  Patient is in today for an acute visit concerned about recurrent vaginal symptoms.  She reports some vaginal itching and irritation.  She notes when urine hits the mucosa there is burning.  She denies any significant vaginal discharge or blood.  No fevers or chills, abdominal pain or back pain.  She does continue to struggle with chronic diffuse pain and weakness.  Has had a  Denies CP/palp/SOB/HA/congestion/fevers/GI or GU c/o. Taking meds as prescribedcouple of falls in her home recently.  No significant injury.  She is also noting some blisters on her rectum at times although she does not have any at the present time.  Patient Care Team: Mosie Lukes, MD as PCP - General (Family Medicine) Rheumatology, [facility] as Consulting Physician   Past Medical History:  Diagnosis Date  . Allergy    allergic rhinitis  . Anemia    NOS  . Arthritis    osteoarthritis  . Asthma   . Degenerative disc disease, lumbar 12/13/2015  . Depression   . Depression with anxiety 01/19/2009   Qualifier: Diagnosis of  By: Redmond Pulling MD, Frann Rider    . Fibromyalgia   . GERD (gastroesophageal reflux disease)   . Hair loss 11/01/2016  . Headache(784.0)   . History of diverticulitis of colon   . Hx of colonic polyps   . Hyperglycemia 11/23/2014  . Hyperlipidemia   . Medicare annual wellness visit, subsequent 11/01/2016  . Migraine   . Mild cognitive impairment 04/29/2016  . Miscarriage   . Obesity, unspecified 10/26/2013  . Otitis, externa, infective 11/23/2014  . Restless leg 08/29/2017  . Thyroid disease    hypothyroidism  . Urinary incontinence     Past Surgical History:  Procedure Laterality Date  . ABDOMINAL HYSTERECTOMY    . BLADDER SUSPENSION    . CHOLECYSTECTOMY    . TONSILLECTOMY      Family History  Problem  Relation Age of Onset  . Alzheimer's disease Mother   . Alzheimer's disease Father   . Cancer Other        female, brain, lung  . Heart disease Other   . Stroke Other   . Diabetes Other   . Arthritis Other   . Hyperlipidemia Other   . Hypertension Other     Social History   Socioeconomic History  . Marital status: Divorced    Spouse name: Not on file  . Number of children: Not on file  . Years of education: Not on file  . Highest education level: Not on file  Occupational History  . Occupation: unemployed    Employer: RETIRED  Social Needs  . Financial resource strain: Not on file  . Food insecurity:    Worry: Not on file    Inability: Not on file  . Transportation needs:    Medical: Not on file    Non-medical: Not on file  Tobacco Use  . Smoking status: Never Smoker  . Smokeless tobacco: Never Used  Substance and Sexual Activity  . Alcohol use: No  . Drug use: No  . Sexual activity: Not on file  Lifestyle  . Physical activity:    Days per week: Not on file    Minutes per session: Not on file  . Stress: Not on file  Relationships  . Social connections:    Talks on phone: Not on file    Gets together: Not on file    Attends religious service: Not on file    Active member of club or organization: Not on file    Attends meetings of clubs or organizations: Not on file    Relationship status: Not on file  . Intimate partner violence:    Fear of current or ex partner: Not on file    Emotionally abused: Not on file    Physically abused: Not on file    Forced sexual activity: Not on file  Other Topics Concern  . Not on file  Social History Narrative  . Not on file    Outpatient Medications Prior to Visit  Medication Sig Dispense Refill  . celecoxib (CELEBREX) 200 MG capsule TAKE 1 CAPSULE BY MOUTH EVERY DAY 30 capsule 0  . clonazePAM (KLONOPIN) 1 MG tablet TAKE 1/2 TO 1 TABLET 3 TIMES DAILY AS NEEDED FOR ANXIETY 90 tablet 0  . hydrOXYzine (ATARAX/VISTARIL)  10 MG tablet TAKE 1 TO 2 TABLETS EVERY DAY AT BEDTIME 30 tablet 4  . ondansetron (ZOFRAN) 4 MG tablet Take 1 tablet (4 mg total) by mouth every 8 (eight) hours as needed for nausea. 30 tablet 0  . rOPINIRole (REQUIP) 0.5 MG tablet TAKE 1-2 TABLETS (0.5-1 MG TOTAL) BY MOUTH AT BEDTIME. 60 tablet 0  . tiZANidine (ZANAFLEX) 4 MG tablet Take 1 tablet (4 mg total) by mouth 2 (two) times daily as needed for muscle spasms. 60 tablet 1  . amitriptyline (ELAVIL) 75 MG tablet TAKE 1 TABLET (75 MG TOTAL) BY MOUTH AT BEDTIME. (Patient taking differently: Take 50 mg by mouth at bedtime. ) 30 tablet 3  . clonazePAM (KLONOPIN) 1 MG tablet TAKE 1/2 TO 1 TABLET 3 TIMES DAILY AS NEEDED FOR ANXIETY 90 tablet 0  . DULoxetine (CYMBALTA) 60 MG capsule TAKE 1 CAPSULE (60 MG TOTAL) BY MOUTH DAILY. 90 capsule 1  . fluconazole (DIFLUCAN) 150 MG tablet TAKE 2 TABLETS (300 MG TOTAL) BY MOUTH ONCE A WEEK. 4 tablet 1  . levothyroxine (SYNTHROID, LEVOTHROID) 75 MCG tablet TAKE 1 TABLET BY MOUTH EVERY DAY 30 tablet 0  . levothyroxine (SYNTHROID, LEVOTHROID) 75 MCG tablet TAKE 1 TABLET BY MOUTH EVERY DAY 30 tablet 0   No facility-administered medications prior to visit.     Allergies  Allergen Reactions  . Adhesive [Tape]     blisters  . Penicillins     REACTION: rash  . Promethazine Hcl Other (See Comments)    Jerky movements    Review of Systems  Constitutional: Positive for malaise/fatigue. Negative for fever.  HENT: Negative for congestion.   Eyes: Negative for blurred vision.  Respiratory: Negative for shortness of breath.   Cardiovascular: Negative for chest pain, palpitations and leg swelling.  Gastrointestinal: Negative for abdominal pain, blood in stool and nausea.  Genitourinary: Negative for dysuria and frequency.  Musculoskeletal: Positive for back pain, falls and joint pain.  Skin: Negative for rash.  Neurological: Positive for weakness. Negative for dizziness, loss of consciousness and headaches.    Endo/Heme/Allergies: Negative for environmental allergies.  Psychiatric/Behavioral: Negative for depression. The patient is not nervous/anxious.        Objective:    Physical Exam  Constitutional: She is oriented to person, place, and time. She appears well-developed and well-nourished. No distress.  HENT:  Head: Normocephalic and atraumatic.  Nose: Nose normal.  Eyes: Right eye exhibits no discharge. Left  eye exhibits no discharge.  Neck: Normal range of motion. Neck supple.  Cardiovascular: Normal rate and regular rhythm.  No murmur heard. Pulmonary/Chest: Effort normal and breath sounds normal.  Abdominal: Soft. Bowel sounds are normal. There is no tenderness.  Genitourinary:  Genitourinary Comments: Vaginal mucosa is vey thin and dry  Musculoskeletal: She exhibits no edema.  Neurological: She is alert and oriented to person, place, and time.  Skin: Skin is warm and dry.  Psychiatric: She has a normal mood and affect.  Nursing note and vitals reviewed.   BP 127/82 (BP Location: Left Arm, Patient Position: Sitting, Cuff Size: Normal)   Pulse 74   Temp 98.6 F (37 C) (Oral)   Resp 18   Wt 226 lb 3.2 oz (102.6 kg)   LMP 11/14/1978   SpO2 100%   BMI 35.43 kg/m  Wt Readings from Last 3 Encounters:  05/31/18 226 lb 3.2 oz (102.6 kg)  01/11/18 225 lb 12.8 oz (102.4 kg)  10/17/17 217 lb 3.2 oz (98.5 kg)   BP Readings from Last 3 Encounters:  05/31/18 127/82  01/11/18 137/85  10/17/17 124/68     Immunization History  Administered Date(s) Administered  . Influenza Split 09/13/2011  . Influenza Whole 08/31/2010  . Influenza,inj,Quad PF,6+ Mos 07/25/2013  . Pneumococcal Conjugate-13 10/25/2013  . Tdap 10/25/2013    Health Maintenance  Topic Date Due  . DEXA SCAN  06/04/2011  . PNA vac Low Risk Adult (2 of 2 - PPSV23) 10/25/2014  . INFLUENZA VACCINE  06/14/2018  . MAMMOGRAM  06/24/2019  . COLONOSCOPY  02/23/2022  . TETANUS/TDAP  10/26/2023  . Hepatitis C  Screening  Completed    Lab Results  Component Value Date   WBC 8.7 05/31/2018   HGB 14.7 05/31/2018   HCT 44.5 05/31/2018   PLT 323.0 05/31/2018   GLUCOSE 92 05/31/2018   CHOL 261 (H) 05/31/2018   TRIG 117.0 05/31/2018   HDL 70.10 05/31/2018   LDLCALC 168 (H) 05/31/2018   ALT 14 05/31/2018   AST 11 05/31/2018   NA 139 05/31/2018   K 4.3 05/31/2018   CL 101 05/31/2018   CREATININE 1.06 05/31/2018   BUN 18 05/31/2018   CO2 30 05/31/2018   TSH 0.34 (L) 05/31/2018   HGBA1C 5.8 05/31/2018    Lab Results  Component Value Date   TSH 0.34 (L) 05/31/2018   Lab Results  Component Value Date   WBC 8.7 05/31/2018   HGB 14.7 05/31/2018   HCT 44.5 05/31/2018   MCV 84.1 05/31/2018   PLT 323.0 05/31/2018   Lab Results  Component Value Date   NA 139 05/31/2018   K 4.3 05/31/2018   CO2 30 05/31/2018   GLUCOSE 92 05/31/2018   BUN 18 05/31/2018   CREATININE 1.06 05/31/2018   BILITOT 0.5 05/31/2018   ALKPHOS 76 05/31/2018   AST 11 05/31/2018   ALT 14 05/31/2018   PROT 7.0 05/31/2018   ALBUMIN 4.1 05/31/2018   CALCIUM 9.5 05/31/2018   GFR 54.16 (L) 05/31/2018   Lab Results  Component Value Date   CHOL 261 (H) 05/31/2018   Lab Results  Component Value Date   HDL 70.10 05/31/2018   Lab Results  Component Value Date   LDLCALC 168 (H) 05/31/2018   Lab Results  Component Value Date   TRIG 117.0 05/31/2018   Lab Results  Component Value Date   CHOLHDL 4 05/31/2018   Lab Results  Component Value Date   HGBA1C 5.8 05/31/2018  Assessment & Plan:   Problem List Items Addressed This Visit    Hypothyroidism    On Levothyroxine, continue to monitor      Relevant Medications   levothyroxine (SYNTHROID, LEVOTHROID) 75 MCG tablet   Other Relevant Orders   TSH (Completed)   Hyperlipidemia   Relevant Orders   Lipid panel (Completed)   Anemia - Primary   Relevant Orders   CBC (Completed)   MUSCLE WEAKNESS (GENERALIZED)    Reports some recent falls.  Offered PT but declines for now,      Hyperglycemia   Relevant Orders   Comprehensive metabolic panel (Completed)   Hemoglobin A1c (Completed)   Subacute vaginitis    Exam c/w atrophic vaginitis but will a oucse o treatment for yeast then stat premarin cream twice weekly      Relevant Medications   conjugated estrogens (PREMARIN) vaginal cream   Other Relevant Orders   Urine cytology ancillary only   HSV Type I/II IgG, IgMw/ reflex (Completed)   Vesicles    Patient reports intermittent episodes of blisters near rectum. None today. HSV testing noted to by HSV 1 IgG. No acute infection. Consider antivirals again if return.        Other Visit Diagnoses    Yeast infection       Relevant Medications   fluconazole (DIFLUCAN) 150 MG tablet   Other Relevant Orders   HIV antibody (with reflex) (Completed)      I have discontinued Aalyah P. Deemer's levothyroxine and amitriptyline. I have also changed her levothyroxine. Additionally, I am having her start on conjugated estrogens and traZODone. Lastly, I am having her maintain her ondansetron, tiZANidine, rOPINIRole, hydrOXYzine, clonazePAM, celecoxib, DULoxetine, and fluconazole.  Meds ordered this encounter  Medications  . DULoxetine (CYMBALTA) 60 MG capsule    Sig: TAKE 1 CAPSULE (60 MG TOTAL) BY MOUTH DAILY.    Dispense:  90 capsule    Refill:  1  . fluconazole (DIFLUCAN) 150 MG tablet    Sig: Take 2 tablets (300 mg total) by mouth once a week.    Dispense:  4 tablet    Refill:  1  . levothyroxine (SYNTHROID, LEVOTHROID) 75 MCG tablet    Sig: Take 1 tablet (75 mcg total) by mouth daily.    Dispense:  90 tablet    Refill:  1  . conjugated estrogens (PREMARIN) vaginal cream    Sig: Place 1 Applicatorful vaginally 3 (three) times a week.    Dispense:  42.5 g    Refill:  3  . traZODone (DESYREL) 50 MG tablet    Sig: Take 0.5-1 tablets (25-50 mg total) by mouth at bedtime as needed for sleep.    Dispense:  30 tablet    Refill:   3    CMA served as scribe during this visit. History, Physical and Plan performed by medical provider. Documentation and orders reviewed and attested to.  Penni Homans, MD

## 2018-05-31 NOTE — Patient Instructions (Signed)

## 2018-06-01 LAB — CBC
HEMATOCRIT: 44.5 % (ref 36.0–46.0)
HEMOGLOBIN: 14.7 g/dL (ref 12.0–15.0)
MCHC: 33.1 g/dL (ref 30.0–36.0)
MCV: 84.1 fl (ref 78.0–100.0)
PLATELETS: 323 10*3/uL (ref 150.0–400.0)
RBC: 5.29 Mil/uL — AB (ref 3.87–5.11)
RDW: 13.1 % (ref 11.5–15.5)
WBC: 8.7 10*3/uL (ref 4.0–10.5)

## 2018-06-01 LAB — COMPREHENSIVE METABOLIC PANEL
ALK PHOS: 76 U/L (ref 39–117)
ALT: 14 U/L (ref 0–35)
AST: 11 U/L (ref 0–37)
Albumin: 4.1 g/dL (ref 3.5–5.2)
BILIRUBIN TOTAL: 0.5 mg/dL (ref 0.2–1.2)
BUN: 18 mg/dL (ref 6–23)
CALCIUM: 9.5 mg/dL (ref 8.4–10.5)
CO2: 30 meq/L (ref 19–32)
CREATININE: 1.06 mg/dL (ref 0.40–1.20)
Chloride: 101 mEq/L (ref 96–112)
GFR: 54.16 mL/min — ABNORMAL LOW (ref >60.00)
Glucose, Bld: 92 mg/dL (ref 70–99)
Potassium: 4.3 mEq/L (ref 3.5–5.1)
Sodium: 139 mEq/L (ref 135–145)
Total Protein: 7 g/dL (ref 6.0–8.3)

## 2018-06-01 LAB — HSV TYPE I/II IGG, IGMW/ REFLEX
HSV 1 Glycoprotein G Ab, IgG: >62.2 index — ABNORMAL HIGH (ref 0.00–0.90)
HSV 1 IgM: <1:10 {titer}
HSV 2 IgG, Type Spec: <0.91 index (ref 0.00–0.90)
HSV 2 IgM: <1:10 {titer}

## 2018-06-01 LAB — HIV ANTIBODY (ROUTINE TESTING W REFLEX): HIV: NONREACTIVE

## 2018-06-01 LAB — LIPID PANEL
CHOL/HDL RATIO: 4
Cholesterol: 261 mg/dL — ABNORMAL HIGH (ref 0–200)
HDL: 70.1 mg/dL (ref >39.00)
LDL Cholesterol: 168 mg/dL — ABNORMAL HIGH (ref 0–99)
NONHDL: 191.07
TRIGLYCERIDES: 117 mg/dL (ref 0.0–149.0)
VLDL: 23.4 mg/dL (ref 0.0–40.0)

## 2018-06-01 LAB — HEMOGLOBIN A1C: HEMOGLOBIN A1C: 5.8 % (ref 4.6–6.5)

## 2018-06-01 LAB — TSH: TSH: 0.34 u[IU]/mL — ABNORMAL LOW (ref 0.35–4.50)

## 2018-06-03 DIAGNOSIS — N761 Subacute and chronic vaginitis: Secondary | ICD-10-CM | POA: Insufficient documentation

## 2018-06-03 DIAGNOSIS — R238 Other skin changes: Secondary | ICD-10-CM | POA: Insufficient documentation

## 2018-06-03 NOTE — Assessment & Plan Note (Signed)
Patient reports intermittent episodes of blisters near rectum. None today. HSV testing noted to by HSV 1 IgG. No acute infection. Consider antivirals again if return.

## 2018-06-03 NOTE — Assessment & Plan Note (Signed)
Exam c/w atrophic vaginitis but will a oucse o treatment for yeast then stat premarin cream twice weekly

## 2018-06-03 NOTE — Assessment & Plan Note (Signed)
On Levothyroxine, continue to monitor 

## 2018-06-03 NOTE — Assessment & Plan Note (Signed)
Reports some recent falls. Offered PT but declines for now,

## 2018-06-04 ENCOUNTER — Other Ambulatory Visit: Payer: Self-pay

## 2018-06-04 ENCOUNTER — Telehealth: Payer: Self-pay | Admitting: *Deleted

## 2018-06-04 DIAGNOSIS — E782 Mixed hyperlipidemia: Secondary | ICD-10-CM

## 2018-06-04 DIAGNOSIS — M797 Fibromyalgia: Secondary | ICD-10-CM

## 2018-06-04 LAB — URINE CYTOLOGY ANCILLARY ONLY: Trichomonas: NEGATIVE

## 2018-06-04 MED ORDER — ATORVASTATIN CALCIUM 10 MG PO TABS: 10.0000 mg | ORAL_TABLET | Freq: Every day | ORAL | 5 refills | Status: DC

## 2018-06-04 MED ORDER — CELECOXIB 200 MG PO CAPS
200.0000 mg | ORAL_CAPSULE | Freq: Two times a day (BID) | ORAL | 1 refills | Status: DC
Start: 2018-06-04 — End: 2018-07-10

## 2018-06-04 NOTE — Telephone Encounter (Signed)
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 No recent or acute infection. Thyroid nearly back to normal. Cholesterol pu somemore. Recommend. Atorvastatin 10 mg tabs, 1 tab po qhs disp #30 with 5 rf.

## 2018-06-04 NOTE — Telephone Encounter (Signed)
Author phoned pt to relay Dr. Frederik Pear recommendation s/p lab results. Pt. stated she did not want to take a statin, becaue she has a "fear that it will cause more dementia than I'm already fighting", that she has "heard from other sources", not Dr. Charlett Blake, that that was the case. Author reassured pt. that statin drugs are more of the widely studied medications, and pt. was then willing to try; order for atorvastatin placed per Dr. Charlett Blake [facility]. Pt. has been taking two tabs of celebrex recently, which she states she discussed with Dr. Charlett Blake, and pt. Needs refills with adjusted sig. New order for celebrex left pended for Dr. Charlett Blake review.

## 2018-06-04 NOTE — Telephone Encounter (Signed)
Received Lab Report results from [facility]; forwarded to provider/SLS 07/22

## 2018-06-06 ENCOUNTER — Other Ambulatory Visit: Payer: Self-pay | Admitting: Family Medicine

## 2018-06-06 LAB — URINE CYTOLOGY ANCILLARY ONLY
BACTERIAL VAGINITIS: POSITIVE — AB
CANDIDA VAGINITIS: NEGATIVE

## 2018-06-14 ENCOUNTER — Telehealth: Payer: Self-pay | Admitting: Family Medicine

## 2018-06-14 NOTE — Telephone Encounter (Signed)
Copied from [place] [id number]. Topic: Quick Communication - See Telephone Encounter >> Jun 14, 2018  5:36 PM Rutherford Nail, NT wrote: CRM for notification. See Telephone encounter for: 06/14/18. Patient calling and states that Dr Charlett Blake told her she could take different variations of these medications. Patient calling and states that her insurance will not cover getting more medication when she is out within the 30 days.  hydrOXYzine (ATARAX/VISTARIL) 10 MG tablet- told she could take 2 tablets, but only given 30 tablets celecoxib (CELEBREX) 200 MG capsule- told she could take 2 tablets, but only given 30 tablets traZODone (DESYREL) 50 MG tablet- requesting to take 2 tablets at night Please advise. CB#: [phone]

## 2018-06-20 ENCOUNTER — Other Ambulatory Visit: Payer: Self-pay | Admitting: Family Medicine

## 2018-06-21 NOTE — Telephone Encounter (Signed)
Requesting:klonopin Contract:yes UDS:low risk next screen 11/24/18 Last OV:05/31/18 Next OV:08/14/18 Last Refill:05/24/18  #[id number] Database:   Please advise

## 2018-06-21 NOTE — Telephone Encounter (Signed)
Please advise 

## 2018-06-21 NOTE — Telephone Encounter (Signed)
That was just to see if the 20 mg dose works better than the 10 mg dose if she needs to 2 we can switch her prescription to hydroxyzine 25 mg tabs, 1 tab po qhs prn disp #30 with 2 rf and cancel the other refills

## 2018-07-10 ENCOUNTER — Other Ambulatory Visit: Payer: Self-pay | Admitting: Family Medicine

## 2018-07-10 DIAGNOSIS — M797 Fibromyalgia: Secondary | ICD-10-CM

## 2018-07-23 ENCOUNTER — Other Ambulatory Visit: Payer: Self-pay | Admitting: Family Medicine

## 2018-07-25 NOTE — Telephone Encounter (Signed)
Requesting: clonazepam Contract: 05/25/19 UDS: 11/24/18 Last OV: 05/31/18 Next OV: 08/14/18 Last Refill: 06/21/18 Database: no descrepencies   Please advise

## 2018-07-27 ENCOUNTER — Other Ambulatory Visit: Payer: Self-pay | Admitting: Family Medicine

## 2018-08-05 ENCOUNTER — Other Ambulatory Visit: Payer: Self-pay | Admitting: Family Medicine

## 2018-08-05 DIAGNOSIS — M797 Fibromyalgia: Secondary | ICD-10-CM

## 2018-08-08 ENCOUNTER — Other Ambulatory Visit: Payer: Self-pay | Admitting: Family Medicine

## 2018-08-14 ENCOUNTER — Encounter: Payer: PPO | Admitting: Family Medicine

## 2018-08-14 DIAGNOSIS — Z0289 Encounter for other administrative examinations: Secondary | ICD-10-CM

## 2018-08-28 ENCOUNTER — Ambulatory Visit (INDEPENDENT_AMBULATORY_CARE_PROVIDER_SITE_OTHER): Payer: PPO | Admitting: Family Medicine

## 2018-08-28 VITALS — BP 140/100 | HR 97 | Temp 98.3°F | Resp 18 | Wt 250.4 lb

## 2018-08-28 DIAGNOSIS — G44209 Tension-type headache, unspecified, not intractable: Secondary | ICD-10-CM

## 2018-08-28 DIAGNOSIS — M7918 Myalgia, other site: Secondary | ICD-10-CM

## 2018-08-28 DIAGNOSIS — R739 Hyperglycemia, unspecified: Secondary | ICD-10-CM

## 2018-08-28 DIAGNOSIS — E782 Mixed hyperlipidemia: Secondary | ICD-10-CM | POA: Diagnosis not present

## 2018-08-28 DIAGNOSIS — E039 Hypothyroidism, unspecified: Secondary | ICD-10-CM | POA: Diagnosis not present

## 2018-08-28 LAB — COMPREHENSIVE METABOLIC PANEL
ALT: 14 U/L (ref 0–35)
AST: 13 U/L (ref 0–37)
Albumin: 4.2 g/dL (ref 3.5–5.2)
Alkaline Phosphatase: 74 U/L (ref 39–117)
BUN: 20 mg/dL (ref 6–23)
CO2: 32 mEq/L (ref 19–32)
Calcium: 9.7 mg/dL (ref 8.4–10.5)
Chloride: 104 mEq/L (ref 96–112)
Creatinine, Ser: 0.93 mg/dL (ref 0.40–1.20)
GFR: 62.94 mL/min (ref >60.00)
Glucose, Bld: 120 mg/dL — ABNORMAL HIGH (ref 70–99)
Potassium: 4.6 mEq/L (ref 3.5–5.1)
Sodium: 141 mEq/L (ref 135–145)
Total Bilirubin: 0.6 mg/dL (ref 0.2–1.2)
Total Protein: 7 g/dL (ref 6.0–8.3)

## 2018-08-28 LAB — SEDIMENTATION RATE: Sed Rate: 18 mm/hr (ref 0–30)

## 2018-08-28 LAB — CBC WITH DIFFERENTIAL/PLATELET
BASOS ABS: 0.1 10*3/uL (ref 0.0–0.1)
Basophils Relative: 0.7 % (ref 0.0–3.0)
Eosinophils Absolute: 0.4 10*3/uL (ref 0.0–0.7)
Eosinophils Relative: 4.6 % (ref 0.0–5.0)
HEMATOCRIT: 45.7 % (ref 36.0–46.0)
HEMOGLOBIN: 15.2 g/dL — AB (ref 12.0–15.0)
LYMPHS ABS: 3 10*3/uL (ref 0.7–4.0)
LYMPHS PCT: 32.6 % (ref 12.0–46.0)
MCHC: 33.3 g/dL (ref 30.0–36.0)
MCV: 83.6 fl (ref 78.0–100.0)
MONOS PCT: 6.7 % (ref 3.0–12.0)
Monocytes Absolute: 0.6 10*3/uL (ref 0.1–1.0)
NEUTROS PCT: 55.4 % (ref 43.0–77.0)
Neutro Abs: 5.1 10*3/uL (ref 1.4–7.7)
Platelets: 323 10*3/uL (ref 150.0–400.0)
RBC: 5.47 Mil/uL — AB (ref 3.87–5.11)
RDW: 13.1 % (ref 11.5–15.5)
WBC: 9.2 10*3/uL (ref 4.0–10.5)

## 2018-08-28 LAB — C-REACTIVE PROTEIN: CRP: 1 mg/dL (ref 0.5–20.0)

## 2018-08-28 MED ORDER — TRAZODONE HCL 100 MG PO TABS: 100.0000 mg | ORAL_TABLET | Freq: Every day | ORAL | 1 refills | Status: DC

## 2018-08-28 MED ORDER — METHYLPREDNISOLONE 4 MG PO TABS: ORAL_TABLET | ORAL | 1 refills | Status: DC

## 2018-08-28 NOTE — Assessment & Plan Note (Signed)
hgba1c acceptable, minimize simple carbs. Increase exercise as tolerated.  

## 2018-08-28 NOTE — Progress Notes (Signed)
Subjective:    Patient ID: Kayla [name], female    DOB: 1946/07/01, 72 y.o.   MRN: [id number]  No chief complaint on file.   HPI Patient is in today for evaluation of worsening pain.  She has a long history of fibromyalgia but over the last 1 to 2 months she has had increasing trouble with joint pain.  She denies any traumas, febrile illness, warmth or redness over the joints.  No nodules or deformity but pain is persistent 24/7 and worse with palpation.  The pain is keeping her up.  Celebrex has been somewhat helpful for some bitemporal headaches she has been getting off and on but has not been particularly helpful for the joint pain.  She reports she has been drinking increased water and trying to eat better.  She endorses some nausea but no vomiting no change in bowel habits or diarrhea.  She stopped atorvastatin about a month ago to see if that would help her pain but it has not. She endorses anhedonia and anxiety around her pain but not suicidal ideaion.   Past Medical History:  Diagnosis Date  . Allergy    allergic rhinitis  . Anemia    NOS  . Arthritis    osteoarthritis  . Asthma   . Degenerative disc disease, lumbar 12/13/2015  . Depression   . Depression with anxiety 01/19/2009   Qualifier: Diagnosis of  By: Redmond Pulling MD, Frann Rider    . Fibromyalgia   . GERD (gastroesophageal reflux disease)   . Hair loss 11/01/2016  . Headache(784.0)   . History of diverticulitis of colon   . Hx of colonic polyps   . Hyperglycemia 11/23/2014  . Hyperlipidemia   . Medicare annual wellness visit, subsequent 11/01/2016  . Migraine   . Mild cognitive impairment 04/29/2016  . Miscarriage   . Obesity, unspecified 10/26/2013  . Otitis, externa, infective 11/23/2014  . Restless leg 08/29/2017  . Thyroid disease    hypothyroidism  . Urinary incontinence     Past Surgical History:  Procedure Laterality Date  . ABDOMINAL HYSTERECTOMY    . BLADDER SUSPENSION    . CHOLECYSTECTOMY    .  TONSILLECTOMY      Family History  Problem Relation Age of Onset  . Alzheimer's disease Mother   . Alzheimer's disease Father   . Cancer Other        female, brain, lung  . Heart disease Other   . Stroke Other   . Diabetes Other   . Arthritis Other   . Hyperlipidemia Other   . Hypertension Other     Social History   Socioeconomic History  . Marital status: Divorced    Spouse name: Not on file  . Number of children: Not on file  . Years of education: Not on file  . Highest education level: Not on file  Occupational History  . Occupation: unemployed    Employer: RETIRED  Social Needs  . Financial resource strain: Not on file  . Food insecurity:    Worry: Not on file    Inability: Not on file  . Transportation needs:    Medical: Not on file    Non-medical: Not on file  Tobacco Use  . Smoking status: Never Smoker  . Smokeless tobacco: Never Used  Substance and Sexual Activity  . Alcohol use: No  . Drug use: No  . Sexual activity: Not on file  Lifestyle  . Physical activity:    Days per week: Not on  file    Minutes per session: Not on file  . Stress: Not on file  Relationships  . Social connections:    Talks on phone: Not on file    Gets together: Not on file    Attends religious service: Not on file    Active member of club or organization: Not on file    Attends meetings of clubs or organizations: Not on file    Relationship status: Not on file  . Intimate partner violence:    Fear of current or ex partner: Not on file    Emotionally abused: Not on file    Physically abused: Not on file    Forced sexual activity: Not on file  Other Topics Concern  . Not on file  Social History Narrative  . Not on file    Outpatient Medications Prior to Visit  Medication Sig Dispense Refill  . celecoxib (CELEBREX) 200 MG capsule Take 1 capsule (200 mg total) by mouth 2 (two) times daily. 60 capsule 1  . clonazePAM (KLONOPIN) 1 MG tablet TAKE 1/2 TO 1 TABLET 3 TIMES A  DAY AS NEEDED FOR ANXIETY 90 tablet 0  . conjugated estrogens (PREMARIN) vaginal cream Place 1 Applicatorful vaginally 3 (three) times a week. 42.5 g 3  . DULoxetine (CYMBALTA) 60 MG capsule TAKE 1 CAPSULE (60 MG TOTAL) BY MOUTH DAILY. 90 capsule 1  . fluconazole (DIFLUCAN) 150 MG tablet Take 2 tablets (300 mg total) by mouth once a week. 4 tablet 1  . hydrOXYzine (ATARAX/VISTARIL) 10 MG tablet TAKE 1 TO 2 TABLETS EVERY DAY AT BEDTIME 30 tablet 4  . levothyroxine (SYNTHROID, LEVOTHROID) 75 MCG tablet Take 1 tablet (75 mcg total) by mouth daily. 90 tablet 1  . ondansetron (ZOFRAN) 4 MG tablet Take 1 tablet (4 mg total) by mouth every 8 (eight) hours as needed for nausea. 30 tablet 0  . rOPINIRole (REQUIP) 0.5 MG tablet Take 1-2 tablets (0.5-1 mg total) by mouth at bedtime. 60 tablet 3  . tiZANidine (ZANAFLEX) 4 MG tablet Take 1 tablet (4 mg total) by mouth 2 (two) times daily as needed for muscle spasms. 60 tablet 1  . atorvastatin (LIPITOR) 10 MG tablet Take 1 tablet (10 mg total) by mouth at bedtime. 30 tablet 5  . traZODone (DESYREL) 50 MG tablet Take 0.5-1 tablets (25-50 mg total) by mouth at bedtime as needed for sleep. 30 tablet 3   No facility-administered medications prior to visit.     Allergies  Allergen Reactions  . Adhesive [Tape]     blisters  . Penicillins     REACTION: rash  . Promethazine Hcl Other (See Comments)    Jerky movements    Review of Systems  Constitutional: Positive for malaise/fatigue. Negative for fever.  HENT: Negative for congestion.   Eyes: Negative for blurred vision.  Respiratory: Negative for shortness of breath.   Cardiovascular: Negative for chest pain, palpitations and leg swelling.  Gastrointestinal: Positive for nausea. Negative for abdominal pain, blood in stool, constipation, diarrhea and vomiting.  Genitourinary: Negative for dysuria and frequency.  Musculoskeletal: Positive for back pain, joint pain and myalgias. Negative for falls.  Skin:  Negative for rash.  Neurological: Positive for headaches. Negative for dizziness and loss of consciousness.  Endo/Heme/Allergies: Negative for environmental allergies.  Psychiatric/Behavioral: Positive for depression. The patient is nervous/anxious and has insomnia.        Objective:    Physical Exam  Constitutional: She is oriented to person, place, and time. She  appears well-developed and well-nourished. No distress.  HENT:  Head: Normocephalic and atraumatic.  Nose: Nose normal.  Eyes: Right eye exhibits no discharge. Left eye exhibits no discharge.  Neck: Normal range of motion. Neck supple.  Cardiovascular: Normal rate and regular rhythm.  No murmur heard. Pulmonary/Chest: Effort normal and breath sounds normal.  Abdominal: Soft. Bowel sounds are normal. There is no tenderness.  Musculoskeletal: She exhibits tenderness. She exhibits no edema or deformity.  Neurological: She is alert and oriented to person, place, and time.  Skin: Skin is warm and dry.  Psychiatric: She has a normal mood and affect.  Nursing note and vitals reviewed.   BP (!) 140/100 (BP Location: Left Arm, Patient Position: Sitting, Cuff Size: Normal)   Pulse 97   Temp 98.3 F (36.8 C) (Oral)   Resp 18   Wt 250 lb 6.4 oz (113.6 kg)   LMP 11/14/1978   SpO2 96%   BMI 39.22 kg/m  Wt Readings from Last 3 Encounters:  08/28/18 250 lb 6.4 oz (113.6 kg)  05/31/18 226 lb 3.2 oz (102.6 kg)  01/11/18 225 lb 12.8 oz (102.4 kg)     Lab Results  Component Value Date   WBC 8.7 05/31/2018   HGB 14.7 05/31/2018   HCT 44.5 05/31/2018   PLT 323.0 05/31/2018   GLUCOSE 92 05/31/2018   CHOL 261 (H) 05/31/2018   TRIG 117.0 05/31/2018   HDL 70.10 05/31/2018   LDLCALC 168 (H) 05/31/2018   ALT 14 05/31/2018   AST 11 05/31/2018   NA 139 05/31/2018   K 4.3 05/31/2018   CL 101 05/31/2018   CREATININE 1.06 05/31/2018   BUN 18 05/31/2018   CO2 30 05/31/2018   TSH 0.34 (L) 05/31/2018   HGBA1C 5.8 05/31/2018     Lab Results  Component Value Date   TSH 0.34 (L) 05/31/2018   Lab Results  Component Value Date   WBC 8.7 05/31/2018   HGB 14.7 05/31/2018   HCT 44.5 05/31/2018   MCV 84.1 05/31/2018   PLT 323.0 05/31/2018   Lab Results  Component Value Date   NA 139 05/31/2018   K 4.3 05/31/2018   CO2 30 05/31/2018   GLUCOSE 92 05/31/2018   BUN 18 05/31/2018   CREATININE 1.06 05/31/2018   BILITOT 0.5 05/31/2018   ALKPHOS 76 05/31/2018   AST 11 05/31/2018   ALT 14 05/31/2018   PROT 7.0 05/31/2018   ALBUMIN 4.1 05/31/2018   CALCIUM 9.5 05/31/2018   GFR 54.16 (L) 05/31/2018   Lab Results  Component Value Date   CHOL 261 (H) 05/31/2018   Lab Results  Component Value Date   HDL 70.10 05/31/2018   Lab Results  Component Value Date   LDLCALC 168 (H) 05/31/2018   Lab Results  Component Value Date   TRIG 117.0 05/31/2018   Lab Results  Component Value Date   CHOLHDL 4 05/31/2018   Lab Results  Component Value Date   HGBA1C 5.8 05/31/2018       Assessment & Plan:   Problem List Items Addressed This Visit    Hypothyroidism - Primary    On Levothyroxine, continue to monitor      Hyperlipidemia    Encouraged heart healthy diet, increase exercise, avoid trans fats, consider a krill oil cap daily. She stopped the Atorvastatin a month ago without improvement in pain      Diffuse arthralgia    Has been worsening over 1-2 months and is now debilitating. All joints hurt  to touch and constantly and all of this is disrupting her sleep. Consider PMR. Given Medrol taper and Trazodone is increased to 100 mg qhs. Check labs and consider referral to rheumatology if she responds to steroids and then the pain returns.       Hyperglycemia    hgba1c acceptable, minimize simple carbs. Increase exercise as tolerated      Relevant Orders   Comprehensive metabolic panel   Headache    Encouraged increased hydration, 64 ounces of clear fluids daily. Minimize alcohol and caffeine. Eat  small frequent meals with lean proteins and complex carbs. Avoid high and low blood sugars. Get adequate sleep, 7-8 hours a night. Needs exercise daily preferably in the morning. Celebrex helps some      Relevant Medications   traZODone (DESYREL) 100 MG tablet      I have discontinued Mouna P. Mellinger's traZODone and atorvastatin. I am also having her start on traZODone and methylPREDNISolone. Additionally, I am having her maintain her ondansetron, tiZANidine, DULoxetine, fluconazole, levothyroxine, conjugated estrogens, clonazePAM, hydrOXYzine, celecoxib, and rOPINIRole.  Meds ordered this encounter  Medications  . traZODone (DESYREL) 100 MG tablet    Sig: Take 1 tablet (100 mg total) by mouth at bedtime.    Dispense:  90 tablet    Refill:  1  . methylPREDNISolone (MEDROL) 4 MG tablet    Sig: 5 tab po qd X d3 then 4 tab po qd X 3d then 3 tab po qd X 3d then 2 tab po qd x 3d then 1 tab po qd x 3 days then stop    Dispense:  45 tablet    Refill:  1      Penni Homans, MD

## 2018-08-28 NOTE — Assessment & Plan Note (Signed)
Encouraged increased hydration, 64 ounces of clear fluids daily. Minimize alcohol and caffeine. Eat small frequent meals with lean proteins and complex carbs. Avoid high and low blood sugars. Get adequate sleep, 7-8 hours a night. Needs exercise daily preferably in the morning. Celebrex helps some

## 2018-08-28 NOTE — Assessment & Plan Note (Signed)
Encouraged heart healthy diet, increase exercise, avoid trans fats, consider a krill oil cap daily. She stopped the Atorvastatin a month ago without improvement in pain

## 2018-08-28 NOTE — Patient Instructions (Signed)
Polymyalgia Rheumatica Polymyalgia rheumatica (PMR) is an inflammatory disorder that causes aching and stiffness in your muscles and joints. Sometimes, PMR leads to a more dangerous condition (temporal arteritis or giant cell arteritis), which can cause vision loss. What are the causes? The exact cause of PMR is not known. What increases the risk? This condition is more likely to develop in:  Females.  People who are 72 years of age or older.  Caucasians.  What are the signs or symptoms?  Pain and stiffness are the main symptoms of PMR. Symptoms may start slowly or suddenly. The symptoms:  May be worse after inactivity and in the morning.  May affect your: ? Hips, buttocks, and thighs. ? Neck, arms, and shoulders. This can make it hard to raise your arms above your head. ? Hands and wrists.  Other symptoms include:  Fever.  Tiredness.  Weakness.  Decreased appetite. This may lead to weight loss.  How is this diagnosed? This condition is diagnosed with a medical history and physical exam. You may need to see a health care provider who specializes in diseases of the joint, muscles, and bones (rheumatologist). You may also have tests, including:  Blood tests.  X-rays.  How is this treated? PMR usually goes away without treatment, but it may take years for that to happen. In the meantime, your health care provider may recommend low-dose steroids to help manage your symptoms of pain and stiffness. Regular exercise and rest will also help your symptoms. Follow these instructions at home:  Take over-the-counter and prescription medicines only as told by your health care provider.  Make sure to get enough rest and sleep.  Eat a healthy and nutritious diet.  Try to exercise most days of the week. Ask your health care provider what type of exercise is best for you.  Keep all follow-up visits as told by your health are provider. This is important. Contact a health care  provider if:  Your symptoms are not controlled with medicine.  You have side effects from steroids. These may include: ? Weight gain. ? Swelling. ? Insomnia. ? Mood changes. ? Bruising. ? High blood sugar readings, if you have diabetes. ? Higher than normal blood pressure readings, if you monitor your blood pressure. Get help right away if:  You develop symptoms of temporal arteritis, such as: ? A change in vision. ? Severe headache. ? Scalp pain. ? Jaw pain. This information is not intended to replace advice given to you by your health care provider. Make sure you discuss any questions you have with your health care provider. Document Released: 12/08/2004 Document Revised: 04/07/2016 Document Reviewed: 05/13/2015 Elsevier Interactive Patient Education  2018 Elsevier Inc[employer]  

## 2018-08-28 NOTE — Assessment & Plan Note (Signed)
On Levothyroxine, continue to monitor 

## 2018-08-28 NOTE — Assessment & Plan Note (Signed)
Has been worsening over 1-2 months and is now debilitating. All joints hurt to touch and constantly and all of this is disrupting her sleep. Consider PMR. Given Medrol taper and Trazodone is increased to 100 mg qhs. Check labs and consider referral to rheumatology if she responds to steroids and then the pain returns.

## 2018-08-28 NOTE — Assessment & Plan Note (Signed)
Ongoing pain across muscles

## 2018-09-02 ENCOUNTER — Other Ambulatory Visit: Payer: Self-pay | Admitting: Family Medicine

## 2018-09-02 DIAGNOSIS — M797 Fibromyalgia: Secondary | ICD-10-CM

## 2018-09-03 ENCOUNTER — Other Ambulatory Visit: Payer: Self-pay | Admitting: Family Medicine

## 2018-09-05 NOTE — Telephone Encounter (Signed)
Pt is requesting refill on clonazepam.   Last OV: 08/28/2018 Last Fill: 07/25/2018 #90 and 0RF UDS: 05/24/2018 Low risk

## 2018-09-20 ENCOUNTER — Other Ambulatory Visit: Payer: Self-pay | Admitting: Family Medicine

## 2018-09-20 DIAGNOSIS — N761 Subacute and chronic vaginitis: Secondary | ICD-10-CM

## 2018-09-28 ENCOUNTER — Other Ambulatory Visit: Payer: Self-pay | Admitting: Family Medicine

## 2018-09-28 DIAGNOSIS — M797 Fibromyalgia: Secondary | ICD-10-CM

## 2018-10-02 ENCOUNTER — Other Ambulatory Visit: Payer: Self-pay | Admitting: Family Medicine

## 2018-10-04 NOTE — Telephone Encounter (Signed)
Requesting:Klonopin Contract:yes UDS:low risk next screen 11/24/18 Last OV:08/28/18 Next OV:10/25/18 Last Refill:09/05/18  #90-0rf Database:   Please advise

## 2018-10-06 ENCOUNTER — Other Ambulatory Visit: Payer: Self-pay | Admitting: Family Medicine

## 2018-10-08 NOTE — Telephone Encounter (Signed)
Requesting:klonopin  Contract:yes UDS:low risk next screen 11/24/18 Last OV:08/28/18  Next OV:10/25/18 Last Refill: Database:   Please advise  '

## 2018-10-09 DIAGNOSIS — Z6839 Body mass index (BMI) 39.0-39.9, adult: Secondary | ICD-10-CM | POA: Diagnosis not present

## 2018-10-09 DIAGNOSIS — M79642 Pain in left hand: Secondary | ICD-10-CM | POA: Diagnosis not present

## 2018-10-09 DIAGNOSIS — R768 Other specified abnormal immunological findings in serum: Secondary | ICD-10-CM | POA: Diagnosis not present

## 2018-10-09 DIAGNOSIS — M2550 Pain in unspecified joint: Secondary | ICD-10-CM | POA: Diagnosis not present

## 2018-10-09 DIAGNOSIS — M79641 Pain in right hand: Secondary | ICD-10-CM | POA: Diagnosis not present

## 2018-10-09 DIAGNOSIS — E669 Obesity, unspecified: Secondary | ICD-10-CM | POA: Diagnosis not present

## 2018-10-10 ENCOUNTER — Other Ambulatory Visit: Payer: Self-pay | Admitting: Family Medicine

## 2018-10-21 ENCOUNTER — Other Ambulatory Visit: Payer: Self-pay | Admitting: Family Medicine

## 2018-10-21 DIAGNOSIS — M797 Fibromyalgia: Secondary | ICD-10-CM

## 2018-10-25 ENCOUNTER — Encounter: Payer: PPO | Admitting: Family Medicine

## 2018-10-25 DIAGNOSIS — Z0289 Encounter for other administrative examinations: Secondary | ICD-10-CM

## 2018-10-26 ENCOUNTER — Other Ambulatory Visit: Payer: Self-pay | Admitting: Family Medicine

## 2018-11-06 ENCOUNTER — Other Ambulatory Visit: Payer: Self-pay | Admitting: Family Medicine

## 2018-11-20 ENCOUNTER — Ambulatory Visit: Payer: PPO | Admitting: Family Medicine

## 2018-11-20 DIAGNOSIS — Z0289 Encounter for other administrative examinations: Secondary | ICD-10-CM

## 2018-11-21 ENCOUNTER — Encounter: Payer: Self-pay | Admitting: Medical

## 2018-11-21 ENCOUNTER — Ambulatory Visit (INDEPENDENT_AMBULATORY_CARE_PROVIDER_SITE_OTHER): Payer: Medicare HMO | Admitting: Medical

## 2018-11-21 ENCOUNTER — Telehealth: Payer: Self-pay | Admitting: Medical

## 2018-11-21 VITALS — BP 135/93 | HR 80 | Temp 98.3°F | Resp 16 | Ht 67.0 in | Wt 256.4 lb

## 2018-11-21 DIAGNOSIS — R5383 Other fatigue: Secondary | ICD-10-CM | POA: Diagnosis not present

## 2018-11-21 DIAGNOSIS — M797 Fibromyalgia: Secondary | ICD-10-CM

## 2018-11-21 DIAGNOSIS — M25552 Pain in left hip: Secondary | ICD-10-CM | POA: Diagnosis not present

## 2018-11-21 DIAGNOSIS — M353 Polymyalgia rheumatica: Secondary | ICD-10-CM | POA: Diagnosis not present

## 2018-11-21 LAB — CBC WITH DIFFERENTIAL/PLATELET
BASOS ABS: 0.1 10*3/uL (ref 0.0–0.1)
Basophils Relative: 0.7 % (ref 0.0–3.0)
Eosinophils Absolute: 0.3 10*3/uL (ref 0.0–0.7)
Eosinophils Relative: 3.1 % (ref 0.0–5.0)
HCT: 46 % (ref 36.0–46.0)
Hemoglobin: 15.1 g/dL — ABNORMAL HIGH (ref 12.0–15.0)
Lymphocytes Relative: 23.8 % (ref 12.0–46.0)
Lymphs Abs: 2.3 10*3/uL (ref 0.7–4.0)
MCHC: 32.8 g/dL (ref 30.0–36.0)
MCV: 84.9 fl (ref 78.0–100.0)
Monocytes Absolute: 0.7 10*3/uL (ref 0.1–1.0)
Monocytes Relative: 7.5 % (ref 3.0–12.0)
NEUTROS ABS: 6.3 10*3/uL (ref 1.4–7.7)
Neutrophils Relative %: 64.9 % (ref 43.0–77.0)
Platelets: 277 10*3/uL (ref 150.0–400.0)
RBC: 5.42 Mil/uL — ABNORMAL HIGH (ref 3.87–5.11)
RDW: 13.7 % (ref 11.5–15.5)
WBC: 9.7 10*3/uL (ref 4.0–10.5)

## 2018-11-21 LAB — COMPREHENSIVE METABOLIC PANEL
ALT: 19 U/L (ref 0–35)
AST: 15 U/L (ref 0–37)
Albumin: 4.1 g/dL (ref 3.5–5.2)
Alkaline Phosphatase: 69 U/L (ref 39–117)
BILIRUBIN TOTAL: 0.8 mg/dL (ref 0.2–1.2)
BUN: 22 mg/dL (ref 6–23)
CO2: 29 mEq/L (ref 19–32)
Calcium: 9.7 mg/dL (ref 8.4–10.5)
Chloride: 106 mEq/L (ref 96–112)
Creatinine, Ser: 0.95 mg/dL (ref 0.40–1.20)
GFR: 61.38 mL/min (ref >60.00)
Glucose, Bld: 100 mg/dL — ABNORMAL HIGH (ref 70–99)
Potassium: 5.2 mEq/L — ABNORMAL HIGH (ref 3.5–5.1)
Sodium: 143 mEq/L (ref 135–145)
Total Protein: 6.7 g/dL (ref 6.0–8.3)

## 2018-11-21 LAB — VITAMIN B12: Vitamin B-12: 411 pg/mL (ref 211–911)

## 2018-11-21 LAB — VITAMIN D 25 HYDROXY (VIT D DEFICIENCY, FRACTURES): VITD: 28.56 ng/mL — ABNORMAL LOW (ref 30.00–100.00)

## 2018-11-21 MED ORDER — METHYLPREDNISOLONE 4 MG PO TABS: ORAL_TABLET | ORAL | 1 refills | Status: DC

## 2018-11-21 MED ORDER — VITAMIN D (ERGOCALCIFEROL) 1.25 MG (50000 UNIT) PO CAPS: 50000.0000 [IU] | ORAL_CAPSULE | ORAL | 0 refills | Status: DC

## 2018-11-21 MED ORDER — ONDANSETRON HCL 4 MG PO TABS: 4.0000 mg | ORAL_TABLET | Freq: Three times a day (TID) | ORAL | 0 refills | Status: AC | PRN

## 2018-11-21 MED ORDER — NEOMYCIN-POLYMYXIN-HC 3.5-10000-1 OT SOLN: 3.0000 [drp] | Freq: Four times a day (QID) | OTIC | 0 refills | Status: DC

## 2018-11-21 MED ORDER — HYDROCODONE-ACETAMINOPHEN 5-325 MG PO TABS: 1.0000 | ORAL_TABLET | Freq: Four times a day (QID) | ORAL | 0 refills | Status: DC | PRN

## 2018-11-21 NOTE — Telephone Encounter (Signed)
RX vit D sent to pharmacy.

## 2018-11-21 NOTE — Progress Notes (Signed)
Subjective:    Patient ID: Kayla [name], female    DOB: 04-05-1946, 73 y.o.   MRN: [id number]  HPI Pt has history of fibromyalgia. Pt also states recent diagnosis of polymyaglgia rheumatica.(Pt states Dr. Charlett Blake thought this).   She states recently her hip pain is hurting worse.   Pt states when trying to sleep pain most agitating. Pt is not diabetic.   Pt wants something for the pain in order to give her relief due to severe pain.  Ears itch chronically for years.     Review of Systems  Constitutional: Positive for fatigue. Negative for chills and fever.  HENT: Negative for congestion, dental problem, postnasal drip, rhinorrhea, sinus pressure and sinus pain.        Ear itching.  Respiratory: Negative for cough, chest tightness, shortness of breath and wheezing.   Cardiovascular: Negative for chest pain and palpitations.  Gastrointestinal: Negative for abdominal pain.  Musculoskeletal: Positive for myalgias.       Diffuse body aches. But more in her hips.  Skin: Negative for rash.  Neurological: Negative for dizziness, speech difficulty and headaches.  Hematological: Negative for adenopathy.  Psychiatric/Behavioral: Negative for behavioral problems.     Past Medical History:  Diagnosis Date  . Allergy    allergic rhinitis  . Anemia    NOS  . Arthritis    osteoarthritis  . Asthma   . Degenerative disc disease, lumbar 12/13/2015  . Depression   . Depression with anxiety 01/19/2009   Qualifier: Diagnosis of  By: Redmond Pulling MD, Frann Rider    . Fibromyalgia   . GERD (gastroesophageal reflux disease)   . Hair loss 11/01/2016  . Headache(784.0)   . History of diverticulitis of colon   . Hx of colonic polyps   . Hyperglycemia 11/23/2014  . Hyperlipidemia   . Medicare annual wellness visit, subsequent 11/01/2016  . Migraine   . Mild cognitive impairment 04/29/2016  . Miscarriage   . Obesity, unspecified 10/26/2013  . Otitis, externa, infective 11/23/2014  . Restless leg  08/29/2017  . Thyroid disease    hypothyroidism  . Urinary incontinence      Social History   Socioeconomic History  . Marital status: Divorced    Spouse name: Not on file  . Number of children: Not on file  . Years of education: Not on file  . Highest education level: Not on file  Occupational History  . Occupation: unemployed    Employer: RETIRED  Social Needs  . Financial resource strain: Not on file  . Food insecurity:    Worry: Not on file    Inability: Not on file  . Transportation needs:    Medical: Not on file    Non-medical: Not on file  Tobacco Use  . Smoking status: Never Smoker  . Smokeless tobacco: Never Used  Substance and Sexual Activity  . Alcohol use: No  . Drug use: No  . Sexual activity: Not on file  Lifestyle  . Physical activity:    Days per week: Not on file    Minutes per session: Not on file  . Stress: Not on file  Relationships  . Social connections:    Talks on phone: Not on file    Gets together: Not on file    Attends religious service: Not on file    Active member of club or organization: Not on file    Attends meetings of clubs or organizations: Not on file    Relationship status: Not on  file  . Intimate partner violence:    Fear of current or ex partner: Not on file    Emotionally abused: Not on file    Physically abused: Not on file    Forced sexual activity: Not on file  Other Topics Concern  . Not on file  Social History Narrative  . Not on file    Past Surgical History:  Procedure Laterality Date  . ABDOMINAL HYSTERECTOMY    . BLADDER SUSPENSION    . CHOLECYSTECTOMY    . TONSILLECTOMY      Family History  Problem Relation Age of Onset  . Alzheimer's disease Mother   . Alzheimer's disease Father   . Cancer Other        female, brain, lung  . Heart disease Other   . Stroke Other   . Diabetes Other   . Arthritis Other   . Hyperlipidemia Other   . Hypertension Other     Allergies  Allergen Reactions  .  Adhesive [Tape]     blisters  . Penicillins     REACTION: rash  . Promethazine Hcl Other (See Comments)    Jerky movements    Current Outpatient Medications on File Prior to Visit  Medication Sig Dispense Refill  . celecoxib (CELEBREX) 200 MG capsule TAKE 1 CAPSULE BY MOUTH TWICE A DAY 60 capsule 1  . clonazePAM (KLONOPIN) 1 MG tablet TAKE 1/2 TO 1 TABLET 3 TIMES A DAY AS NEEDED FOR ANXIETY 90 tablet 0  . clonazePAM (KLONOPIN) 1 MG tablet TAKE 1/2 TO 1 TABLET 3 TIMES A DAY AS NEEDED FOR ANXIETY 90 tablet 0  . DULoxetine (CYMBALTA) 60 MG capsule TAKE 1 CAPSULE (60 MG TOTAL) BY MOUTH DAILY. 90 capsule 1  . fluconazole (DIFLUCAN) 150 MG tablet Take 2 tablets (300 mg total) by mouth once a week. 4 tablet 1  . hydrOXYzine (ATARAX/VISTARIL) 10 MG tablet TAKE 1 TO 2 TABLETS EVERY DAY AT BEDTIME 180 tablet 2  . levothyroxine (SYNTHROID, LEVOTHROID) 75 MCG tablet Take 1 tablet (75 mcg total) by mouth daily. 90 tablet 1  . methylPREDNISolone (MEDROL) 4 MG tablet 5 tab po qd X d3 then 4 tab po qd X 3d then 3 tab po qd X 3d then 2 tab po qd x 3d then 1 tab po qd x 3 days then stop 45 tablet 1  . ondansetron (ZOFRAN) 4 MG tablet Take 1 tablet (4 mg total) by mouth every 8 (eight) hours as needed for nausea. 30 tablet 0  . PREMARIN vaginal cream PLACE 1 APPLICATORFUL VAGINALLY 3 (THREE) TIMES A WEEK. 30 g 3  . rOPINIRole (REQUIP) 0.5 MG tablet TAKE 1-2 TABLETS (0.5-1 MG TOTAL) BY MOUTH AT BEDTIME. 180 tablet 1  . tiZANidine (ZANAFLEX) 4 MG tablet TAKE 1 TABLET (4 MG TOTAL) BY MOUTH 2 (TWO) TIMES DAILY AS NEEDED FOR MUSCLE SPASMS. 60 tablet 1  . traZODone (DESYREL) 100 MG tablet Take 1 tablet (100 mg total) by mouth at bedtime. 90 tablet 1   No current facility-administered medications on file prior to visit.     BP (!) 135/93   Pulse 80   Temp 98.3 F (36.8 C) (Oral)   Resp 16   Ht 5\' 7"  (1.702 m)   Wt 256 lb 6.4 oz (116.3 kg)   LMP 11/14/1978   SpO2 98%   BMI 40.16 kg/m      Objective:    Physical Exam  General Mental Status- Alert. General Appearance- Not in acute distress.  Skin General: Color- Normal Color. Moisture- Normal Moisture.  Neck Carotid Arteries- Normal color. Moisture- Normal Moisture. No carotid bruits. No JVD.  Chest and Lung Exam Auscultation: Breath Sounds:-Normal.  Cardiovascular Auscultation:Rythm- Regular. Murmurs & Other Heart Sounds:Auscultation of the heart reveals- No Murmurs.  Abdomen Inspection:-Inspeection Normal. Palpation/Percussion:Note:No mass. Palpation and Percussion of the abdomen reveal- Non Tender, Non Distended + BS, no rebound or guarding.   Neurologic Cranial Nerve exam:- CN III-XII intact(No nystagmus), symmetric smile. Strength:- 5/5 equal and symmetric strength both upper and lower extremities.  Hips- mild pain on palpation rt side. Left side more tender. More pain anterior superior iliac spine areas.     Assessment & Plan:  You do have history of fibromyalgia, polymyalgia rheumatica, and recent worse left hip pain.  I am prescribing a Medrol dose taper course, limited number of Norco prescription for pain and want you to get left hip x-ray.  For further narcotic type medications will defer refill to PCP.  For chronic right ear pain, I did prescribe Cortisporin otic drops.  You have had this pain for years and presently it does not appear that you have any ear infection/otitis media.  If pain worsens please let us know.  For recent fatigue, did order CBC, CMP, B12, B1 and vitamin D.  Follow-up date to be determined after lab review.  Mackie Pai, PA-C

## 2018-11-21 NOTE — Patient Instructions (Signed)
You do have history of fibromyalgia, polymyalgia rheumatica, and recent worse left hip pain.  I am prescribing a Medrol dose taper course, limited number of Norco prescription for pain and want you to get left hip x-ray.  For further narcotic type medications will defer refill to PCP.  For chronic right ear pain, I did prescribe Cortisporin otic drops.  You have had this pain for years and presently it does not appear that you have any ear infection/otitis media.  If pain worsens please let us know.  For recent fatigue, did order CBC, CMP, B12, B1 and vitamin D.  Follow-up date to be determined after lab review.

## 2018-11-22 ENCOUNTER — Other Ambulatory Visit: Payer: Self-pay | Admitting: Family Medicine

## 2018-11-24 LAB — VITAMIN B1: Vitamin B1 (Thiamine): 12 nmol/L (ref 8–30)

## 2018-12-02 ENCOUNTER — Other Ambulatory Visit: Payer: Self-pay | Admitting: Family Medicine

## 2018-12-02 DIAGNOSIS — B379 Candidiasis, unspecified: Secondary | ICD-10-CM

## 2018-12-03 ENCOUNTER — Other Ambulatory Visit: Payer: Self-pay | Admitting: Family Medicine

## 2018-12-03 NOTE — Telephone Encounter (Signed)
Requesting:Klonopin Contract:yes UDS:low risk next screen 11/24/18 Last OV:11/21/18 W/Edward  05/31/18 W/you Patient has had 3 no shows within 12 months  11/20/18 10/25/18 08/14/18 Next OV: 12/27/18 Last Refill:10/08/18  #90-0rf Database:   Please advise

## 2018-12-06 ENCOUNTER — Other Ambulatory Visit: Payer: Self-pay | Admitting: Family Medicine

## 2018-12-11 ENCOUNTER — Other Ambulatory Visit: Payer: Self-pay | Admitting: Family Medicine

## 2018-12-20 ENCOUNTER — Other Ambulatory Visit: Payer: Self-pay | Admitting: Family Medicine

## 2018-12-27 ENCOUNTER — Encounter: Payer: PPO | Admitting: Family Medicine

## 2018-12-30 ENCOUNTER — Other Ambulatory Visit: Payer: Self-pay | Admitting: Family Medicine

## 2019-01-04 DIAGNOSIS — Z6841 Body Mass Index (BMI) 40.0 and over, adult: Secondary | ICD-10-CM | POA: Diagnosis not present

## 2019-01-04 DIAGNOSIS — I739 Peripheral vascular disease, unspecified: Secondary | ICD-10-CM | POA: Diagnosis not present

## 2019-01-04 DIAGNOSIS — R69 Illness, unspecified: Secondary | ICD-10-CM | POA: Diagnosis not present

## 2019-01-04 DIAGNOSIS — G2581 Restless legs syndrome: Secondary | ICD-10-CM | POA: Diagnosis not present

## 2019-01-04 DIAGNOSIS — M353 Polymyalgia rheumatica: Secondary | ICD-10-CM | POA: Diagnosis not present

## 2019-01-04 DIAGNOSIS — G4700 Insomnia, unspecified: Secondary | ICD-10-CM | POA: Diagnosis not present

## 2019-01-04 DIAGNOSIS — E039 Hypothyroidism, unspecified: Secondary | ICD-10-CM | POA: Diagnosis not present

## 2019-01-09 ENCOUNTER — Other Ambulatory Visit: Payer: Self-pay | Admitting: Family Medicine

## 2019-01-10 NOTE — Telephone Encounter (Signed)
Requesting:klonopin Contract:yes UDS:low risk nrxt screen 11/24/18 Last OV:11/21/18 Next OV:02/04/19 Last Refill:12/03/18  #90-0rf Database:   Please advise

## 2019-01-28 DIAGNOSIS — R69 Illness, unspecified: Secondary | ICD-10-CM | POA: Diagnosis not present

## 2019-02-02 ENCOUNTER — Other Ambulatory Visit: Payer: Self-pay | Admitting: Medical

## 2019-02-04 ENCOUNTER — Encounter: Payer: Medicare HMO | Admitting: Family Medicine

## 2019-02-05 ENCOUNTER — Other Ambulatory Visit: Payer: Self-pay | Admitting: Family Medicine

## 2019-02-07 ENCOUNTER — Other Ambulatory Visit: Payer: Self-pay | Admitting: Family Medicine

## 2019-02-08 NOTE — Telephone Encounter (Signed)
Requesting:clonazepam  Contract:yes UDS:low risk next screen 11/24/18 Last OV:11/21/18 Next OV:n/a Last Refill:01/10/19  #90-0rf Database:   Please advise

## 2019-03-01 ENCOUNTER — Telehealth: Payer: Self-pay

## 2019-03-01 NOTE — Telephone Encounter (Signed)
PA initiated via Covermymeds; KEY: [id number]. Awaiting determination.

## 2019-03-01 NOTE — Telephone Encounter (Signed)
PA approved.   PA Case: [id number], Status: Approved, Coverage Starts on: 11/12/2018, Coverage Ends on: 11/14/2019. Questions? Contact [phone]

## 2019-03-07 ENCOUNTER — Other Ambulatory Visit: Payer: Self-pay | Admitting: Family Medicine

## 2019-03-08 ENCOUNTER — Telehealth: Payer: Self-pay

## 2019-03-08 NOTE — Telephone Encounter (Signed)
Requesting:klonopin Contract:yes JGZ:[id number] one  Last OV:11/21/18 w/ Percell Miller 10/15/19w/ PCP will call patient to set up virtual  Next OV: Last Refill:02/08/19  #[id number] Database:   Please advise

## 2019-03-08 NOTE — Telephone Encounter (Signed)
Called patient to let her know that Dr. Charlett Blake needs to see this patient via virtual visit in order to keep refilling her control meds. Dr. Charlett Blake has openings for Monday

## 2019-03-08 NOTE — Telephone Encounter (Signed)
Pt returned call. Please call back   [phone]

## 2019-03-11 ENCOUNTER — Ambulatory Visit (INDEPENDENT_AMBULATORY_CARE_PROVIDER_SITE_OTHER): Payer: Medicare HMO | Admitting: Family Medicine

## 2019-03-11 ENCOUNTER — Other Ambulatory Visit: Payer: Self-pay

## 2019-03-11 DIAGNOSIS — E039 Hypothyroidism, unspecified: Secondary | ICD-10-CM | POA: Diagnosis not present

## 2019-03-11 DIAGNOSIS — G894 Chronic pain syndrome: Secondary | ICD-10-CM | POA: Diagnosis not present

## 2019-03-11 DIAGNOSIS — M79621 Pain in right upper arm: Secondary | ICD-10-CM | POA: Diagnosis not present

## 2019-03-11 DIAGNOSIS — M19011 Primary osteoarthritis, right shoulder: Secondary | ICD-10-CM | POA: Diagnosis not present

## 2019-03-11 DIAGNOSIS — G2581 Restless legs syndrome: Secondary | ICD-10-CM | POA: Diagnosis not present

## 2019-03-11 DIAGNOSIS — G4700 Insomnia, unspecified: Secondary | ICD-10-CM | POA: Diagnosis not present

## 2019-03-11 DIAGNOSIS — M797 Fibromyalgia: Secondary | ICD-10-CM | POA: Diagnosis not present

## 2019-03-11 MED ORDER — HYDROXYZINE HCL 25 MG PO TABS: 25.0000 mg | ORAL_TABLET | Freq: Every evening | ORAL | 3 refills | Status: DC | PRN

## 2019-03-11 MED ORDER — CELECOXIB 200 MG PO CAPS: 200.0000 mg | ORAL_CAPSULE | Freq: Two times a day (BID) | ORAL | 1 refills | Status: DC

## 2019-03-11 MED ORDER — TRAZODONE HCL 100 MG PO TABS: 100.0000 mg | ORAL_TABLET | Freq: Every evening | ORAL | 1 refills | Status: DC | PRN

## 2019-03-11 MED ORDER — ROPINIROLE HCL 0.5 MG PO TABS: 0.5000 mg | ORAL_TABLET | Freq: Every day | ORAL | 1 refills | Status: DC

## 2019-03-11 NOTE — Assessment & Plan Note (Signed)
Did not find Requip helpful so she stopped it. Consider Mirapex in future.

## 2019-03-11 NOTE — Assessment & Plan Note (Signed)
Patient struggles with chronic diffuse pain is referred to pain management to see if they can add anything to her management. She has is not a good candidate for hydrocodone due to numerous other medications she is on although she does state it helps her pain more than other options. Encouraged moist heat and gentle stretching as tolerated. May try NSAIDs and prescription meds as directed and report if symptoms worsen or seek immediate care

## 2019-03-11 NOTE — Assessment & Plan Note (Signed)
Still not sleeping well. Encouraged good sleep hygiene such as dark, quiet room. No blue/green glowing lights such as computer screens in bedroom. No alcohol or stimulants in evening. Cut down on caffeine as able. Regular exercise is helpful but not just prior to bed time. Will try increasing her Hydroxyzine to 25 mg qhs

## 2019-03-11 NOTE — Assessment & Plan Note (Signed)
On Levothyroxine, continue to monitor 

## 2019-03-11 NOTE — Telephone Encounter (Signed)
Please schedule virtual w/ pcp some time this week

## 2019-03-11 NOTE — Progress Notes (Signed)
Virtual Visit via Telephone Note  I connected with Kayla [name] on 03/11/19 at  3:20 PM EDT by a telephone enabled telemedicine application and verified that I am speaking with the correct person using two identifiers.   I discussed the limitations of evaluation and management by telemedicine and the availability of in person appointments. The patient expressed understanding and agreed to proceed. Magdalene Molly, CMA tried to get patient set up on video visit but was unsuccessful so visit was completed via telephone.    Subjective:    Patient ID: Kayla [name], female    DOB: [birth year], 73 y.o.   MRN: [id number]  No chief complaint on file.   HPI Patient is in today for follow up on chronic medical concerns such as hypothyroidism, RLS, chronic pain, hyperlipidemia and more. She notes her baseline diffuse pain is worse recently. No recent febrile illness or hospitalizations. Notes still not sleeping well. Did not find Requip helpful for her RLS symptoms. Denies CP/palp/SOB/HA/congestion/fevers/GI or GU c/o. Taking meds as prescribed  Past Medical History:  Diagnosis Date  . Allergy    allergic rhinitis  . Anemia    NOS  . Arthritis    osteoarthritis  . Asthma   . Degenerative disc disease, lumbar 12/13/2015  . Depression   . Depression with anxiety 01/19/2009   Qualifier: Diagnosis of  By: Redmond Pulling MD, Frann Rider    . Fibromyalgia   . GERD (gastroesophageal reflux disease)   . Hair loss 11/01/2016  . Headache(784.0)   . History of diverticulitis of colon   . Hx of colonic polyps   . Hyperglycemia 11/23/2014  . Hyperlipidemia   . Medicare annual wellness visit, subsequent 11/01/2016  . Migraine   . Mild cognitive impairment 04/29/2016  . Miscarriage   . Obesity, unspecified 10/26/2013  . Otitis, externa, infective 11/23/2014  . Restless leg 08/29/2017  . Thyroid disease    hypothyroidism  . Urinary incontinence     Past Surgical History:  Procedure Laterality Date  . ABDOMINAL  HYSTERECTOMY    . BLADDER SUSPENSION    . CHOLECYSTECTOMY    . TONSILLECTOMY      Family History  Problem Relation Age of Onset  . Alzheimer's disease Mother   . Alzheimer's disease Father   . Cancer Other        female, brain, lung  . Heart disease Other   . Stroke Other   . Diabetes Other   . Arthritis Other   . Hyperlipidemia Other   . Hypertension Other     Social History   Socioeconomic History  . Marital status: Divorced    Spouse name: Not on file  . Number of children: Not on file  . Years of education: Not on file  . Highest education level: Not on file  Occupational History  . Occupation: unemployed    Employer: RETIRED  Social Needs  . Financial resource strain: Not on file  . Food insecurity:    Worry: Not on file    Inability: Not on file  . Transportation needs:    Medical: Not on file    Non-medical: Not on file  Tobacco Use  . Smoking status: Never Smoker  . Smokeless tobacco: Never Used  Substance and Sexual Activity  . Alcohol use: No  . Drug use: No  . Sexual activity: Not on file  Lifestyle  . Physical activity:    Days per week: Not on file    Minutes per session: Not on file  .  Stress: Not on file  Relationships  . Social connections:    Talks on phone: Not on file    Gets together: Not on file    Attends religious service: Not on file    Active member of club or organization: Not on file    Attends meetings of clubs or organizations: Not on file    Relationship status: Not on file  . Intimate partner violence:    Fear of current or ex partner: Not on file    Emotionally abused: Not on file    Physically abused: Not on file    Forced sexual activity: Not on file  Other Topics Concern  . Not on file  Social History Narrative  . Not on file    Outpatient Medications Prior to Visit  Medication Sig Dispense Refill  . clonazePAM (KLONOPIN) 1 MG tablet TAKE 1/2 TO 1 TABLET 3 TIMES A DAY AS NEEDED FOR ANXIETY 90 tablet 0  .  DULoxetine (CYMBALTA) 60 MG capsule TAKE 1 CAPSULE BY MOUTH EVERY DAY 90 capsule 1  . fluconazole (DIFLUCAN) 150 MG tablet TAKE 2 TABLETS (300 MG TOTAL) BY MOUTH ONCE A WEEK. 4 tablet 1  . levothyroxine (SYNTHROID, LEVOTHROID) 75 MCG tablet TAKE 1 TABLET BY MOUTH EVERY DAY 90 tablet 1  . methylPREDNISolone (MEDROL) 4 MG tablet 5 tab po qd X d3 then 4 tab po qd X 3d then 3 tab po qd X 3d then 2 tab po qd x 3d then 1 tab po qd x 3 days then stop 45 tablet 1  . ondansetron (ZOFRAN) 4 MG tablet Take 1 tablet (4 mg total) by mouth every 8 (eight) hours as needed for nausea. 30 tablet 0  . PREMARIN vaginal cream PLACE 1 APPLICATORFUL VAGINALLY 3 (THREE) TIMES A WEEK. 30 g 3  . tiZANidine (ZANAFLEX) 4 MG tablet TAKE 1 TABLET (4 MG TOTAL) BY MOUTH 2 (TWO) TIMES DAILY AS NEEDED FOR MUSCLE SPASMS. 180 tablet 1  . Vitamin D, Ergocalciferol, (DRISDOL) 1.25 MG (50000 UT) CAPS capsule Take 1 capsule (50,000 Units total) by mouth every 7 (seven) days. 8 capsule 0  . celecoxib (CELEBREX) 200 MG capsule TAKE 1 CAPSULE BY MOUTH TWICE A DAY 60 capsule 1  . HYDROcodone-acetaminophen (NORCO) 5-325 MG tablet Take 1 tablet by mouth every 6 (six) hours as needed for moderate pain. 10 tablet 0  . hydrOXYzine (ATARAX/VISTARIL) 10 MG tablet TAKE 1 TO 2 TABLETS EVERY DAY AT BEDTIME 180 tablet 2  . neomycin-polymyxin-hydrocortisone (CORTISPORIN) OTIC solution Place 3 drops into the right ear 4 (four) times daily. 10 mL 0  . rOPINIRole (REQUIP) 0.5 MG tablet TAKE 1-2 TABLETS (0.5-1 MG TOTAL) BY MOUTH AT BEDTIME. 180 tablet 1  . traZODone (DESYREL) 100 MG tablet TAKE 1 TABLET BY MOUTH EVERYDAY AT BEDTIME 90 tablet 1   No facility-administered medications prior to visit.     Allergies  Allergen Reactions  . Adhesive [Tape]     blisters  . Penicillins     REACTION: rash  . Promethazine Hcl Other (See Comments)    Jerky movements    Review of Systems  Constitutional: Positive for malaise/fatigue. Negative for fever.   HENT: Negative for congestion.   Eyes: Negative for blurred vision.  Respiratory: Negative for shortness of breath.   Cardiovascular: Negative for chest pain, palpitations and leg swelling.  Gastrointestinal: Negative for abdominal pain, blood in stool and nausea.  Genitourinary: Negative for dysuria and frequency.  Musculoskeletal: Positive for back pain, falls,  joint pain, myalgias and neck pain.  Skin: Negative for rash.  Neurological: Negative for dizziness, loss of consciousness and headaches.  Endo/Heme/Allergies: Negative for environmental allergies.  Psychiatric/Behavioral: Positive for depression. The patient is nervous/anxious and has insomnia.        Objective:    Physical Exam Unable to obtain due to telephone encounter  LMP 11/14/1978  Wt Readings from Last 3 Encounters:  11/21/18 256 lb 6.4 oz (116.3 kg)  08/28/18 250 lb 6.4 oz (113.6 kg)  05/31/18 226 lb 3.2 oz (102.6 kg)    Diabetic Foot Exam - Simple   No data filed     Lab Results  Component Value Date   WBC 9.7 11/21/2018   HGB 15.1 (H) 11/21/2018   HCT 46.0 11/21/2018   PLT 277.0 11/21/2018   GLUCOSE 100 (H) 11/21/2018   CHOL 261 (H) 05/31/2018   TRIG 117.0 05/31/2018   HDL 70.10 05/31/2018   LDLCALC 168 (H) 05/31/2018   ALT 19 11/21/2018   AST 15 11/21/2018   NA 143 11/21/2018   K 5.2 (H) 11/21/2018   CL 106 11/21/2018   CREATININE 0.95 11/21/2018   BUN 22 11/21/2018   CO2 29 11/21/2018   TSH 0.34 (L) 05/31/2018   HGBA1C 5.8 05/31/2018    Lab Results  Component Value Date   TSH 0.34 (L) 05/31/2018   Lab Results  Component Value Date   WBC 9.7 11/21/2018   HGB 15.1 (H) 11/21/2018   HCT 46.0 11/21/2018   MCV 84.9 11/21/2018   PLT 277.0 11/21/2018   Lab Results  Component Value Date   NA 143 11/21/2018   K 5.2 (H) 11/21/2018   CO2 29 11/21/2018   GLUCOSE 100 (H) 11/21/2018   BUN 22 11/21/2018   CREATININE 0.95 11/21/2018   BILITOT 0.8 11/21/2018   ALKPHOS 69 11/21/2018   AST  15 11/21/2018   ALT 19 11/21/2018   PROT 6.7 11/21/2018   ALBUMIN 4.1 11/21/2018   CALCIUM 9.7 11/21/2018   GFR 61.38 11/21/2018   Lab Results  Component Value Date   CHOL 261 (H) 05/31/2018   Lab Results  Component Value Date   HDL 70.10 05/31/2018   Lab Results  Component Value Date   LDLCALC 168 (H) 05/31/2018   Lab Results  Component Value Date   TRIG 117.0 05/31/2018   Lab Results  Component Value Date   CHOLHDL 4 05/31/2018   Lab Results  Component Value Date   HGBA1C 5.8 05/31/2018       Assessment & Plan:   Problem List Items Addressed This Visit    Hypothyroidism    On Levothyroxine, continue to monitor      Chronic pain syndrome   Relevant Medications   traZODone (DESYREL) 100 MG tablet   celecoxib (CELEBREX) 200 MG capsule   Other Relevant Orders   Ambulatory referral to Pain Clinic   Osteoarthritis    Patient struggles with chronic diffuse pain is referred to pain management to see if they can add anything to her management. She has is not a good candidate for hydrocodone due to numerous other medications she is on although she does state it helps her pain more than other options. Encouraged moist heat and gentle stretching as tolerated. May try NSAIDs and prescription meds as directed and report if symptoms worsen or seek immediate care      Relevant Medications   celecoxib (CELEBREX) 200 MG capsule   Insomnia    Still not sleeping well. Encouraged  good sleep hygiene such as dark, quiet room. No blue/green glowing lights such as computer screens in bedroom. No alcohol or stimulants in evening. Cut down on caffeine as able. Regular exercise is helpful but not just prior to bed time. Will try increasing her Hydroxyzine to 25 mg qhs      RLS (restless legs syndrome)    Did not find Requip helpful so she stopped it. Consider Mirapex in future.        Other Visit Diagnoses    Pain in right axilla    -  Primary   Relevant Orders   DG Shoulder  Right   Fibromyalgia       Relevant Medications   traZODone (DESYREL) 100 MG tablet   celecoxib (CELEBREX) 200 MG capsule      I have discontinued Delbert P. Creswell's hydrOXYzine, HYDROcodone-acetaminophen, and neomycin-polymyxin-hydrocortisone. I have also changed her traZODone and celecoxib. Additionally, I am having her start on hydrOXYzine. Lastly, I am having her maintain her Premarin, methylPREDNISolone, ondansetron, Vitamin D (Ergocalciferol), DULoxetine, fluconazole, levothyroxine, tiZANidine, clonazePAM, and rOPINIRole.  Meds ordered this encounter  Medications  . hydrOXYzine (ATARAX/VISTARIL) 25 MG tablet    Sig: Take 1 tablet (25 mg total) by mouth at bedtime as needed for anxiety.    Dispense:  30 tablet    Refill:  3  . traZODone (DESYREL) 100 MG tablet    Sig: Take 1 tablet (100 mg total) by mouth at bedtime as needed for sleep.    Dispense:  90 tablet    Refill:  1  . celecoxib (CELEBREX) 200 MG capsule    Sig: Take 1 capsule (200 mg total) by mouth 2 (two) times daily.    Dispense:  180 capsule    Refill:  1  . rOPINIRole (REQUIP) 0.5 MG tablet    Sig: Take 1-2 tablets (0.5-1 mg total) by mouth at bedtime.    Dispense:  180 tablet    Refill:  1     I discussed the assessment and treatment plan with the patient. The patient was provided an opportunity to ask questions and all were answered. The patient agreed with the plan and demonstrated an understanding of the instructions.   The patient was advised to call back or seek an in-person evaluation if the symptoms worsen or if the condition fails to improve as anticipated.  I provided 25 minutes of non-face-to-face time during this encounter.   Penni Homans, MD

## 2019-04-08 ENCOUNTER — Other Ambulatory Visit: Payer: Self-pay | Admitting: Family Medicine

## 2019-04-10 NOTE — Telephone Encounter (Signed)
Requesting:klonopin Contract:yes UDS:low risk next screen  Last OV:03/11/19 Next OV:04/15/19 Last Refill:03/10/19  #90-0rf Database:   Please advise

## 2019-04-15 ENCOUNTER — Telehealth: Payer: Self-pay

## 2019-04-15 ENCOUNTER — Other Ambulatory Visit: Payer: Self-pay

## 2019-04-15 ENCOUNTER — Ambulatory Visit: Payer: Medicare HMO | Admitting: Family Medicine

## 2019-04-15 DIAGNOSIS — B379 Candidiasis, unspecified: Secondary | ICD-10-CM

## 2019-04-15 MED ORDER — FLUCONAZOLE 150 MG PO TABS: 300.0000 mg | ORAL_TABLET | ORAL | 1 refills | Status: DC

## 2019-04-15 NOTE — Telephone Encounter (Signed)
Copied from [place] [id number]. Topic: Quick Communication - Appointment Cancellation >> Apr 12, 2019 10:53 AM Celene Kras A wrote: Patient called to cancel appointment scheduled for 04/15/2019. Patient has not rescheduled their appointment. Pt is requesting a call back to reschedule. Pt is also for medication for her yeast infection. Please advise.   Route to department's PEC pool.   Kirsten, Can you schedule patient an appointment does not have to be sson

## 2019-04-15 NOTE — Telephone Encounter (Signed)
Patient scheduled for June 23rd. Requesting medication for yeast infection in the mean time.

## 2019-05-07 ENCOUNTER — Other Ambulatory Visit: Payer: Self-pay

## 2019-05-07 ENCOUNTER — Ambulatory Visit: Payer: Medicare HMO | Admitting: Family Medicine

## 2019-05-08 ENCOUNTER — Other Ambulatory Visit: Payer: Self-pay | Admitting: Family Medicine

## 2019-05-09 NOTE — Telephone Encounter (Signed)
Requesting:klonopin  Contract:yes UDS:low risk next screen 11/27/18 Last OV:03/11/19 Next OV:n/a Last Refill:04/10/19  #90-0rf Database:   Please advise

## 2019-05-21 IMAGING — CT CT HIP*R* W/O CM
2 of 4 series · 17 of 46 positions shown, 19 images · non-contrast
Comparison: Plain films right hip the 04/11/2017.

CLINICAL DATA: Right hip and groin pain since a fall several weeks
ago.

EXAM:
CT OF THE RIGHT HIP WITHOUT CONTRAST
TECHNIQUE: Multidetector CT imaging of the right hip was performed according to
the standard protocol. Multiplanar CT image reconstructions were
also generated.

[Series 5: axial soft tissue · axial · 0.55mm/px · z∈[-721,-533]mm · 14 of 110 slices shown, 16 images]
[im 8/110  soft-tissue]
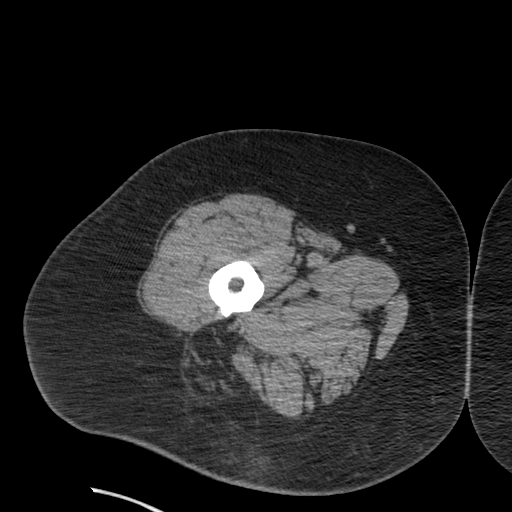
[im 8/110  bone]
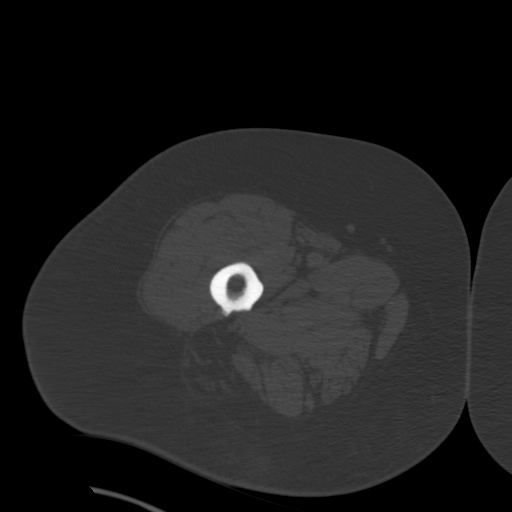
[im 15/110  soft-tissue]
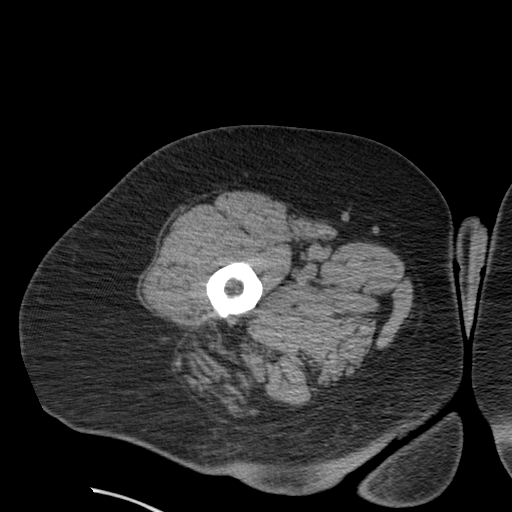
[im 22/110  soft-tissue]
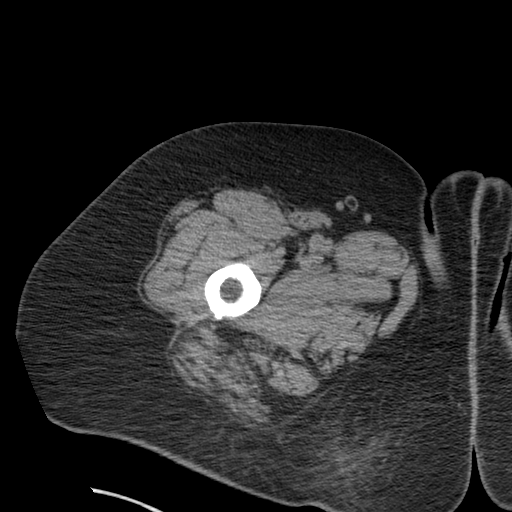
[im 29/110  soft-tissue]
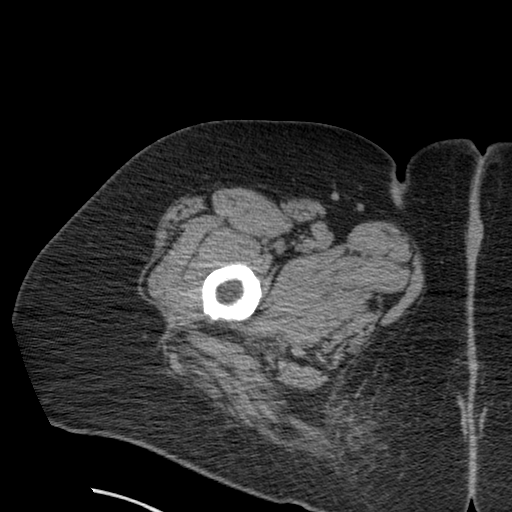
[im 36/110  soft-tissue]
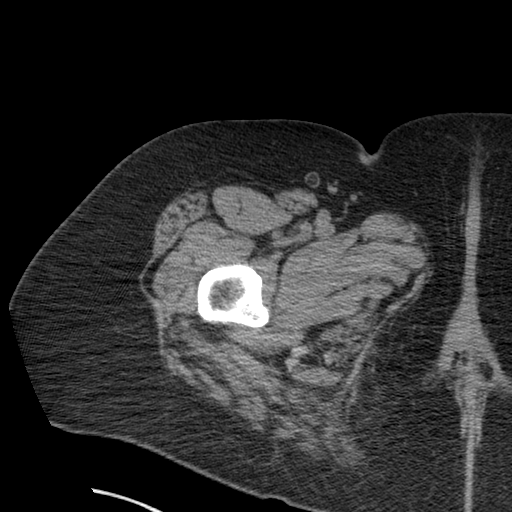
[im 43/110  soft-tissue]
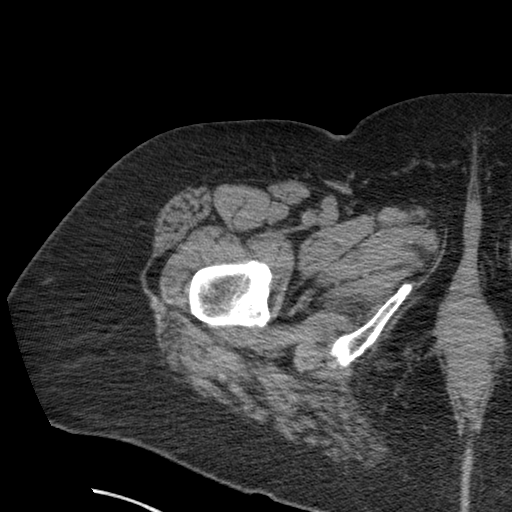
[im 50/110  soft-tissue]
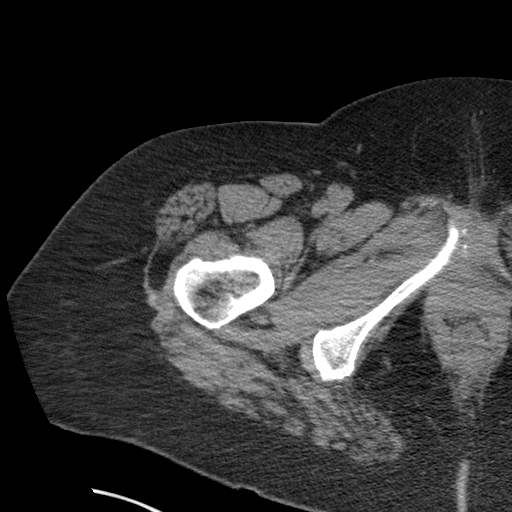
[im 60/110  soft-tissue]
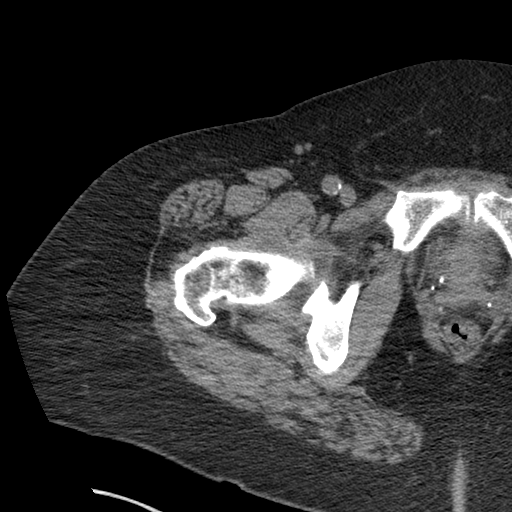
[im 67/110  soft-tissue]
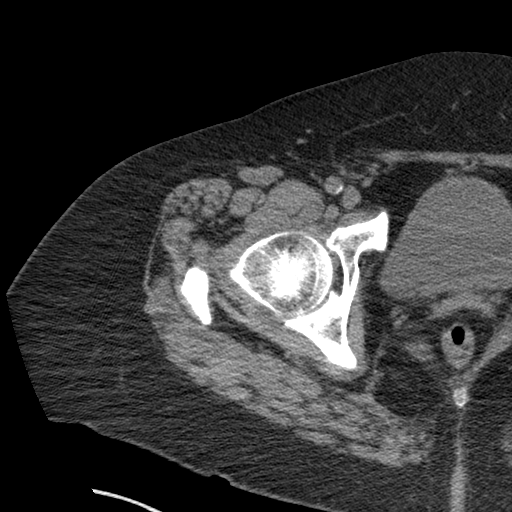
[im 67/110  bone]
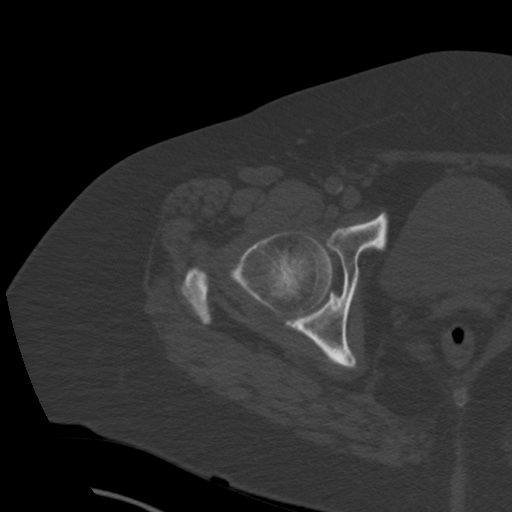
[im 74/110  soft-tissue]
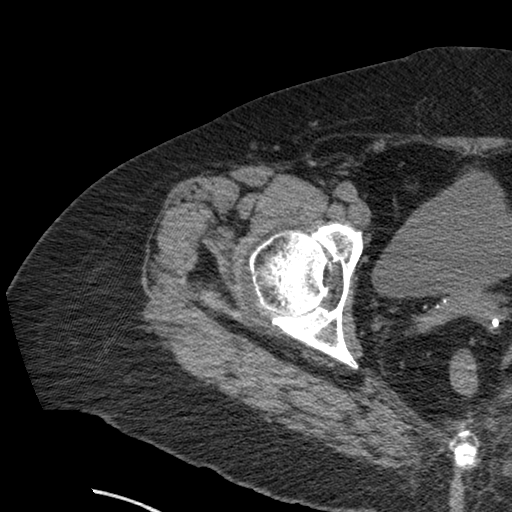
[im 81/110  soft-tissue]
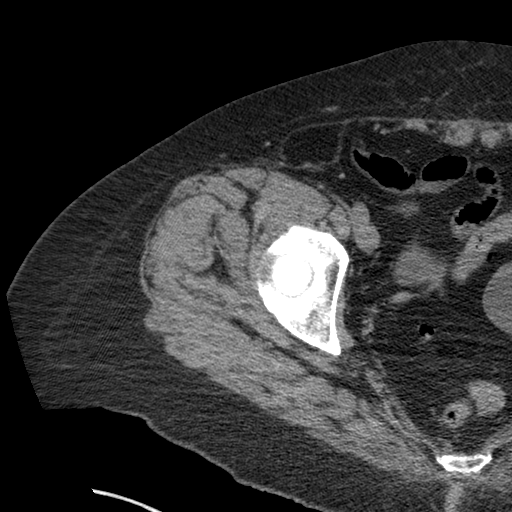
[im 88/110  soft-tissue]
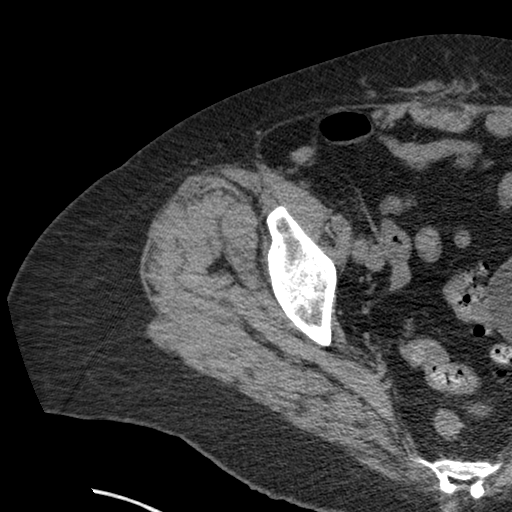
[im 95/110  soft-tissue]
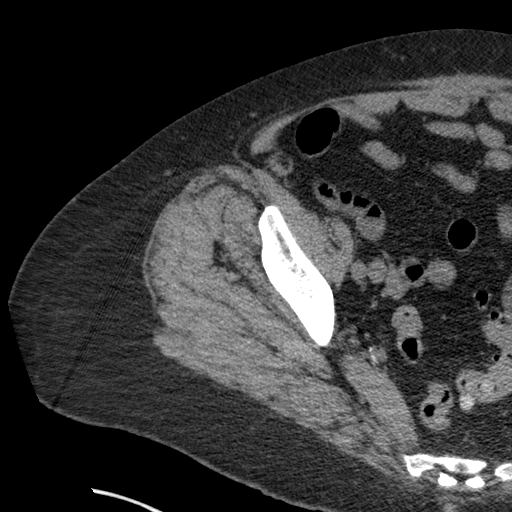
[im 102/110  soft-tissue]
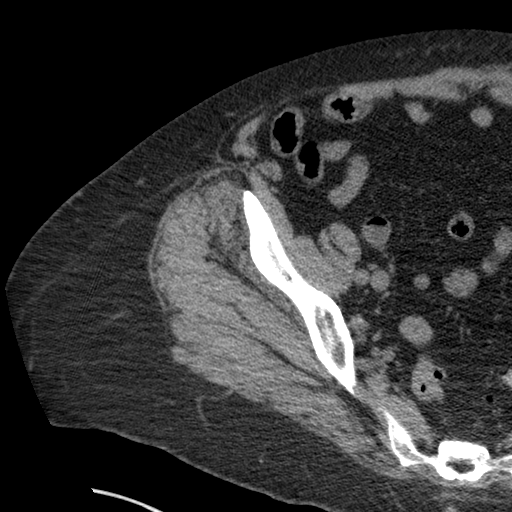

[Series 8: coronal st · coronal · 0.44mm/px · 3 of 120 slices shown]
[im 24/120  soft-tissue]
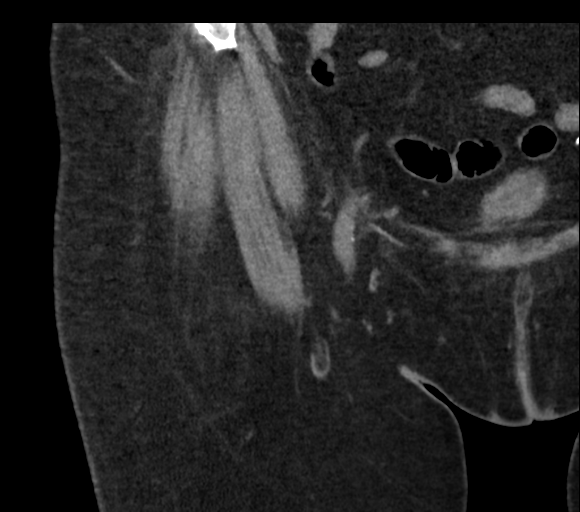
[im 48/120  soft-tissue]
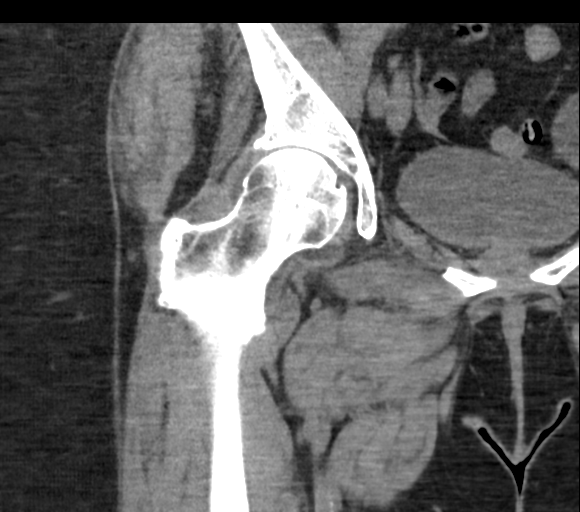
[im 72/120  soft-tissue]
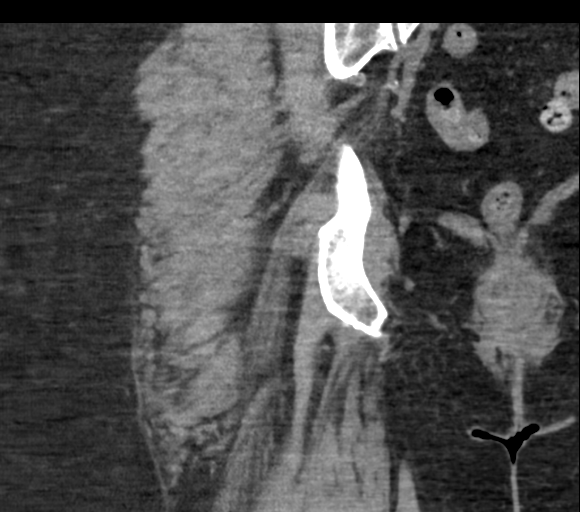

[17 of 46 positions shown; findings below may reference images not displayed]

FINDINGS: Bones/Joint/Cartilage

No acute bony or joint abnormality is identified. Mild joint space
narrowing is present about the right hip. No focal bony lesion is
identified. No avascular necrosis of the right femoral head.

Ligaments

Suboptimally assessed by CT.

Muscles and Tendons

Intact.

Soft tissues

Small to moderate right hip joint effusion is noted. Imaged
intrapelvic contents demonstrate postoperative change of
hysterectomy. There is some sigmoid diverticulosis is present.
IMPRESSION: Negative for fracture or other acute bony abnormality.

Small to moderate right hip joint effusion.

Sigmoid diverticulosis.

## 2019-06-02 ENCOUNTER — Other Ambulatory Visit: Payer: Self-pay | Admitting: Family Medicine

## 2019-06-05 ENCOUNTER — Other Ambulatory Visit: Payer: Self-pay | Admitting: Family Medicine

## 2019-06-06 NOTE — Telephone Encounter (Signed)
Requesting:klonopin Contract: yes UDS:low risk next screen 11/14/18 Last OV:03/11/19 Next OV:n/a Last Refill: 05/09/19  #90-0rf Database:   Please advise

## 2019-06-26 ENCOUNTER — Other Ambulatory Visit: Payer: Self-pay | Admitting: Family Medicine

## 2019-07-02 ENCOUNTER — Other Ambulatory Visit: Payer: Self-pay | Admitting: Family Medicine

## 2019-07-03 ENCOUNTER — Telehealth: Payer: Self-pay | Admitting: Family Medicine

## 2019-07-03 ENCOUNTER — Other Ambulatory Visit: Payer: Self-pay | Admitting: Family Medicine

## 2019-07-03 DIAGNOSIS — M25519 Pain in unspecified shoulder: Secondary | ICD-10-CM

## 2019-07-03 NOTE — Telephone Encounter (Signed)
Patient wanted to inform PCP a lot of family issues have been going on and she will schedule her imaging appointment and then follow up with PCP.

## 2019-07-05 ENCOUNTER — Other Ambulatory Visit: Payer: Self-pay | Admitting: Family Medicine

## 2019-07-05 DIAGNOSIS — M797 Fibromyalgia: Secondary | ICD-10-CM

## 2019-07-08 NOTE — Telephone Encounter (Signed)
Requesting:Klonopin  Contract:yes UDS:low risk next screen 12/03/2018 Last OV:03/11/19 Next OV:n/a Last Refill:06/06/19  #90-0rf Database:   Please advise

## 2019-07-27 ENCOUNTER — Other Ambulatory Visit: Payer: Self-pay | Admitting: Family Medicine

## 2019-08-06 ENCOUNTER — Other Ambulatory Visit: Payer: Self-pay | Admitting: Family Medicine

## 2019-08-07 NOTE — Telephone Encounter (Signed)
Requesting:klonopin Contract:yes [id number]  Last OV:03/11/19 Next OV:n/a Last Refill:07/08/19  #90-0rf Database:   Please advise

## 2019-08-28 ENCOUNTER — Ambulatory Visit (HOSPITAL_BASED_OUTPATIENT_CLINIC_OR_DEPARTMENT_OTHER)
Admission: RE | Admit: 2019-08-28 | Discharge: 2019-08-28 | Disposition: A | Discharge status: Home / Self Care | Payer: Medicare HMO | Source: Ambulatory Visit | Attending: Medical | Admitting: Medical

## 2019-08-28 ENCOUNTER — Other Ambulatory Visit: Payer: Self-pay

## 2019-08-28 ENCOUNTER — Other Ambulatory Visit: Payer: Self-pay | Admitting: Medical

## 2019-08-28 DIAGNOSIS — M79621 Pain in right upper arm: Secondary | ICD-10-CM

## 2019-08-28 DIAGNOSIS — M25551 Pain in right hip: Secondary | ICD-10-CM

## 2019-08-28 DIAGNOSIS — M25552 Pain in left hip: Secondary | ICD-10-CM | POA: Insufficient documentation

## 2019-08-28 DIAGNOSIS — M1611 Unilateral primary osteoarthritis, right hip: Secondary | ICD-10-CM | POA: Diagnosis not present

## 2019-08-28 DIAGNOSIS — M25511 Pain in right shoulder: Secondary | ICD-10-CM | POA: Diagnosis not present

## 2019-09-05 ENCOUNTER — Telehealth: Payer: Self-pay

## 2019-09-05 NOTE — Telephone Encounter (Signed)
Notify severe arthritis, no acute fracture or concern. Can refer to ortho for consideration if pain is still severe as they may have to consider surgery or an intervention of some sort.

## 2019-09-05 NOTE — Telephone Encounter (Signed)
Copied from [place] [id number]. Topic: Quick Communication - Other Results (Clinic Use ONLY) >> Sep 05, 2019  9:34 AM Pauline Good wrote: Pt want results of  her hip xray

## 2019-09-05 NOTE — Telephone Encounter (Signed)
Please advise on hip x-ray

## 2019-09-05 NOTE — Telephone Encounter (Signed)
Call back [phone] Pt was waiting for these results.

## 2019-09-06 ENCOUNTER — Other Ambulatory Visit: Payer: Self-pay | Admitting: Family Medicine

## 2019-09-06 ENCOUNTER — Other Ambulatory Visit: Payer: Self-pay | Admitting: *Deleted

## 2019-09-06 ENCOUNTER — Telehealth: Payer: Self-pay

## 2019-09-06 DIAGNOSIS — M25559 Pain in unspecified hip: Secondary | ICD-10-CM

## 2019-09-06 NOTE — Telephone Encounter (Signed)
Your patient I believe.

## 2019-09-06 NOTE — Telephone Encounter (Signed)
Kayla [name] has spoke with patient and has let patient know what she needed to do

## 2019-09-06 NOTE — Telephone Encounter (Signed)
Copied from [place] [id number]. Topic: General - Other >> Sep 06, 2019 12:59 PM Pauline Good wrote: Reason for CRM: pt stated the ortho office [facility] need notes from the office for her arthritis in hip fax to [phone] and she need to pick xray on a disc

## 2019-09-06 NOTE — Telephone Encounter (Signed)
Requesting:kloponin  Contract:yes UDS:n/a Last OV:03/11/19 Next OV:n/a Last Refill:08/07/19  #90-0rf Database:   Please advise

## 2019-09-06 NOTE — Telephone Encounter (Signed)
Advised patient of results of hip xray back from 08/30/19 and she agreed to referral to orthopedic.  Referral was already sent for shoulder to [facility] for her shoulder.  Orthopedic office stated that they did get referral, but they do not do multiple problems at a time.  She is not sure if insurance requires a referral.  If not then she can call to make an appointment on her own.  Phone number given for patient make an appointment [phone].  She is in a lot of pain and can barely move and she would like something for pain until she gets in with ortho.  Can we help her out with this?

## 2019-09-08 NOTE — Telephone Encounter (Signed)
She is on Clonazepam so she cannot take anything stronger. Take the Celebrex bid, the Tizanidine bid and Tylenol ES 500 mg tabs, 2 tabs tid and use lidocaine patches daily to affected area

## 2019-09-10 NOTE — Telephone Encounter (Signed)
Called patient unable to leave voicemail  

## 2019-09-29 ENCOUNTER — Other Ambulatory Visit: Payer: Self-pay | Admitting: Family Medicine

## 2019-10-06 ENCOUNTER — Other Ambulatory Visit: Payer: Self-pay | Admitting: Family Medicine

## 2019-10-06 DIAGNOSIS — B379 Candidiasis, unspecified: Secondary | ICD-10-CM

## 2019-10-10 ENCOUNTER — Other Ambulatory Visit: Payer: Self-pay | Admitting: Family Medicine

## 2019-10-14 ENCOUNTER — Other Ambulatory Visit: Payer: Self-pay | Admitting: Family Medicine

## 2019-10-14 MED ORDER — CLONAZEPAM 1 MG PO TABS: 0.5000 mg | ORAL_TABLET | Freq: Three times a day (TID) | ORAL | 0 refills | Status: DC | PRN

## 2019-10-14 NOTE — Telephone Encounter (Signed)
Copied from [place] [id number]. Topic: Quick Communication - Rx Refill/Question >> Oct 14, 2019  1:37 PM Rainey Pines A wrote: Medication: clonazePAM (KLONOPIN) 1 MG tablet (Patient stated that she is compltely out of medication and would like her request expedited if possible. )  Has the patient contacted their pharmacy?Yes (Agent: If no, request that the patient contact the pharmacy for the refill.) (Agent: If yes, when and what did the pharmacy advise?)Contact PCP  Preferred Pharmacy (with phone number or street name): [id number] - [place], [place] [phone] (Phone) [phone] (Fax)    Agent: Please be advised that RX refills may take up to 3 business days. We ask that you follow-up with your pharmacy.

## 2019-10-27 ENCOUNTER — Other Ambulatory Visit: Payer: Self-pay | Admitting: Family Medicine

## 2019-10-31 ENCOUNTER — Other Ambulatory Visit: Payer: Self-pay | Admitting: Family Medicine

## 2019-11-11 ENCOUNTER — Other Ambulatory Visit: Payer: Self-pay | Admitting: Medical

## 2019-11-21 ENCOUNTER — Other Ambulatory Visit: Payer: Self-pay | Admitting: Family Medicine

## 2019-11-22 NOTE — Telephone Encounter (Signed)
Requesting:klonopin Contract: yes UDS: moderate risk next screen 11/24/18 Last OV:03/11/19 Next OV:n/a Last Refill:10/14/19  #90-0rf Database:   Please advise

## 2019-12-02 ENCOUNTER — Other Ambulatory Visit: Payer: Self-pay | Admitting: Family Medicine

## 2019-12-28 ENCOUNTER — Other Ambulatory Visit: Payer: Self-pay | Admitting: Family Medicine

## 2019-12-29 ENCOUNTER — Other Ambulatory Visit: Payer: Self-pay | Admitting: Family Medicine

## 2019-12-30 ENCOUNTER — Telehealth: Payer: Self-pay | Admitting: Family Medicine

## 2019-12-30 ENCOUNTER — Other Ambulatory Visit: Payer: Self-pay | Admitting: Family Medicine

## 2019-12-30 DIAGNOSIS — M797 Fibromyalgia: Secondary | ICD-10-CM

## 2019-12-30 NOTE — Telephone Encounter (Signed)
Sent 30 day supply of cymbalta to pharmacy. Pt is past due for follow up. Please call pt to schedule OV with Charlett Blake soon. She will need to be seen before further refills can be given.

## 2019-12-30 NOTE — Telephone Encounter (Signed)
Called patient to schedule AWV, but no answer. Will try to call patient back at a later time. SF 

## 2019-12-31 NOTE — Telephone Encounter (Signed)
Line busy x 1

## 2019-12-31 NOTE — Telephone Encounter (Signed)
Line busy x 2

## 2019-12-31 NOTE — Telephone Encounter (Signed)
Requesting: Celebrex & Klonopin  Contract: none UDS:05/24/18 Last Visit: 03/11/19 Next Visit:none Last Refill:celebrex( 07/08/19) & Klonopin(11/22/19)  Please Advise

## 2020-01-03 NOTE — Telephone Encounter (Signed)
Line busy x3

## 2020-01-23 ENCOUNTER — Other Ambulatory Visit: Payer: Self-pay | Admitting: Family Medicine

## 2020-01-23 NOTE — Telephone Encounter (Signed)
Must schedule virtual visit first then we can send in enough to last/thx dmf

## 2020-02-01 ENCOUNTER — Other Ambulatory Visit: Payer: Self-pay | Admitting: Family Medicine

## 2020-02-02 NOTE — Telephone Encounter (Signed)
Patient is past due for ov.  Last written: 12/31/19  Last ov: 03/10/19 Next ov: none Contract: 05/24/18 UDS: 05/24/18

## 2020-02-19 DIAGNOSIS — M797 Fibromyalgia: Secondary | ICD-10-CM | POA: Diagnosis not present

## 2020-02-19 DIAGNOSIS — K582 Mixed irritable bowel syndrome: Secondary | ICD-10-CM | POA: Diagnosis not present

## 2020-02-19 DIAGNOSIS — H5211 Myopia, right eye: Secondary | ICD-10-CM | POA: Diagnosis not present

## 2020-02-19 DIAGNOSIS — H524 Presbyopia: Secondary | ICD-10-CM | POA: Diagnosis not present

## 2020-02-19 DIAGNOSIS — H52222 Regular astigmatism, left eye: Secondary | ICD-10-CM | POA: Diagnosis not present

## 2020-02-19 DIAGNOSIS — K449 Diaphragmatic hernia without obstruction or gangrene: Secondary | ICD-10-CM | POA: Diagnosis not present

## 2020-02-19 DIAGNOSIS — R1084 Generalized abdominal pain: Secondary | ICD-10-CM | POA: Diagnosis not present

## 2020-02-19 DIAGNOSIS — G894 Chronic pain syndrome: Secondary | ICD-10-CM | POA: Diagnosis not present

## 2020-02-27 ENCOUNTER — Other Ambulatory Visit: Payer: Self-pay

## 2020-02-27 ENCOUNTER — Telehealth: Payer: Medicare HMO | Admitting: Family Medicine

## 2020-02-27 ENCOUNTER — Other Ambulatory Visit: Payer: Self-pay | Admitting: Family Medicine

## 2020-03-02 ENCOUNTER — Telehealth (INDEPENDENT_AMBULATORY_CARE_PROVIDER_SITE_OTHER): Payer: Medicare HMO | Admitting: Family Medicine

## 2020-03-02 ENCOUNTER — Ambulatory Visit: Payer: Medicare HMO | Admitting: Family Medicine

## 2020-03-02 ENCOUNTER — Other Ambulatory Visit: Payer: Self-pay

## 2020-03-02 DIAGNOSIS — M25551 Pain in right hip: Secondary | ICD-10-CM | POA: Diagnosis not present

## 2020-03-02 DIAGNOSIS — K219 Gastro-esophageal reflux disease without esophagitis: Secondary | ICD-10-CM | POA: Diagnosis not present

## 2020-03-02 DIAGNOSIS — F418 Other specified anxiety disorders: Secondary | ICD-10-CM

## 2020-03-02 DIAGNOSIS — E039 Hypothyroidism, unspecified: Secondary | ICD-10-CM

## 2020-03-02 DIAGNOSIS — B379 Candidiasis, unspecified: Secondary | ICD-10-CM | POA: Diagnosis not present

## 2020-03-02 DIAGNOSIS — R109 Unspecified abdominal pain: Secondary | ICD-10-CM

## 2020-03-02 DIAGNOSIS — E782 Mixed hyperlipidemia: Secondary | ICD-10-CM

## 2020-03-02 DIAGNOSIS — M19011 Primary osteoarthritis, right shoulder: Secondary | ICD-10-CM

## 2020-03-02 DIAGNOSIS — M25561 Pain in right knee: Secondary | ICD-10-CM | POA: Diagnosis not present

## 2020-03-02 DIAGNOSIS — R739 Hyperglycemia, unspecified: Secondary | ICD-10-CM

## 2020-03-02 DIAGNOSIS — M25562 Pain in left knee: Secondary | ICD-10-CM

## 2020-03-02 DIAGNOSIS — R69 Illness, unspecified: Secondary | ICD-10-CM | POA: Diagnosis not present

## 2020-03-02 DIAGNOSIS — K449 Diaphragmatic hernia without obstruction or gangrene: Secondary | ICD-10-CM

## 2020-03-02 MED ORDER — TRETINOIN 0.1 % EX CREA: TOPICAL_CREAM | Freq: Every day | CUTANEOUS | 1 refills | Status: DC

## 2020-03-02 MED ORDER — FLUCONAZOLE 150 MG PO TABS: 300.0000 mg | ORAL_TABLET | ORAL | 1 refills | Status: DC

## 2020-03-02 MED ORDER — DULOXETINE HCL 60 MG PO CPEP: ORAL_CAPSULE | ORAL | 1 refills | Status: DC

## 2020-03-02 NOTE — Assessment & Plan Note (Signed)
Avoid offending foods, start probiotics. Do not eat large meals in late evening and consider raising head of bed. Referred to Dr Bryan Lemma of GI to consider procedure to help control symptoms

## 2020-03-02 NOTE — Progress Notes (Signed)
Virtual Visit via Phone Note  I connected with Kayla [name] on 03/02/20 at  2:20 PM EDT by a phone enabled telemedicine application and verified that I am speaking with the correct person using two identifiers.  Location: Patient: home Provider: office   I discussed the limitations of evaluation and management by telemedicine and the availability of in person appointments. The patient expressed understanding and agreed to proceed. Kem Boroughs, CMA was able to get the patient setup on a phone visit after being unable to [place] video visit     Subjective:    Patient ID: Kayla [name], female    DOB: [birth year], 74 y.o.   MRN: [id number]  Chief Complaint  Patient presents with  . Hypothyroidism  . Hip Pain    wants to talk about electric chair.  . Abdominal Pain    went to [facility] 02/19/20/they placed a referral to GI  . Edema    both legs  . glands under arms hurt  . both feet and hands stay cold all the time  . Medication Refill    duloxetine, fluconazole, retina cream    HPI Patient is in today for follow up on chronic medical concerns. She is currently staying with her daughter near [place] but will be back next week. She has numerous concerns. One is her right hip pain with radiculopathy persists and she is ready to have it evaluated. It is limiting her mobility. She is struggling with upper abdominal pain L>R and worse after eating. Notes nausea but denies vomiting. Bowels are changing sometimes they are yellow, sometimes very pale and sometimes brown. They are moving daily. No fevers or chills. Denies CP/palp/SOB/HA/congestion/fevers or GU c/o. Taking meds as prescribed  Past Medical History:  Diagnosis Date  . Allergy    allergic rhinitis  . Anemia    NOS  . Arthritis    osteoarthritis  . Asthma   . Degenerative disc disease, lumbar 12/13/2015  . Depression   . Depression with anxiety 01/19/2009   Qualifier: Diagnosis of  By: Redmond Pulling MD,  Frann Rider    . Fibromyalgia   . GERD (gastroesophageal reflux disease)   . Hair loss 11/01/2016  . Headache(784.0)   . History of diverticulitis of colon   . Hx of colonic polyps   . Hyperglycemia 11/23/2014  . Hyperlipidemia   . Medicare annual wellness visit, subsequent 11/01/2016  . Migraine   . Mild cognitive impairment 04/29/2016  . Miscarriage   . Obesity, unspecified 10/26/2013  . Otitis, externa, infective 11/23/2014  . Restless leg 08/29/2017  . Thyroid disease    hypothyroidism  . Urinary incontinence     Past Surgical History:  Procedure Laterality Date  . ABDOMINAL HYSTERECTOMY    . BLADDER SUSPENSION    . CHOLECYSTECTOMY    . TONSILLECTOMY      Family History  Problem Relation Age of Onset  . Alzheimer's disease Mother   . Alzheimer's disease Father   . Cancer Other        female, brain, lung  . Heart disease Other   . Stroke Other   . Diabetes Other   . Arthritis Other   . Hyperlipidemia Other   . Hypertension Other     Social History   Socioeconomic History  . Marital status: Divorced    Spouse name: Not on file  . Number of children: Not on file  . Years of education: Not on file  . Highest education level: Not on  file  Occupational History  . Occupation: unemployed    Employer: RETIRED  Tobacco Use  . Smoking status: Never Smoker  . Smokeless tobacco: Never Used  Substance and Sexual Activity  . Alcohol use: No  . Drug use: No  . Sexual activity: Not on file  Other Topics Concern  . Not on file  Social History Narrative  . Not on file   Social Determinants of Health   Financial Resource Strain:   . Difficulty of Paying Living Expenses:   Food Insecurity:   . Worried About Charity fundraiser in the Last Year:   . Arboriculturist in the Last Year:   Transportation Needs:   . Film/video editor (Medical):   Marland Kitchen Lack of Transportation (Non-Medical):   Physical Activity:   . Days of Exercise per Week:   . Minutes of Exercise per  Session:   Stress:   . Feeling of Stress :   Social Connections:   . Frequency of Communication with Friends and Family:   . Frequency of Social Gatherings with Friends and Family:   . Attends Religious Services:   . Active Member of Clubs or Organizations:   . Attends Archivist Meetings:   Marland Kitchen Marital Status:   Intimate Partner Violence:   . Fear of Current or Ex-Partner:   . Emotionally Abused:   Marland Kitchen Physically Abused:   . Sexually Abused:     Outpatient Medications Prior to Visit  Medication Sig Dispense Refill  . celecoxib (CELEBREX) 200 MG capsule TAKE 1 CAPSULE BY MOUTH TWICE A DAY 180 capsule 1  . clonazePAM (KLONOPIN) 1 MG tablet TAKE 0.5-1 TABLETS (0.5-1 MG TOTAL) BY MOUTH 3 (THREE) TIMES DAILY AS NEEDED FOR ANXIETY. 90 tablet 0  . hydrOXYzine (ATARAX/VISTARIL) 25 MG tablet TAKE 1 TABLET (25 MG TOTAL) BY MOUTH AT BEDTIME AS NEEDED FOR ANXIETY. 30 tablet 3  . levothyroxine (SYNTHROID) 75 MCG tablet TAKE 1 TABLET BY MOUTH EVERY DAY 90 tablet 1  . Melatonin 10 MG TABS Take 1 tablet by mouth at bedtime as needed and may repeat dose one time if needed.    . ondansetron (ZOFRAN) 4 MG tablet Take 1 tablet (4 mg total) by mouth every 8 (eight) hours as needed for nausea. 30 tablet 0  . PREMARIN vaginal cream PLACE 1 APPLICATORFUL VAGINALLY 3 (THREE) TIMES A WEEK. 30 g 3  . tiZANidine (ZANAFLEX) 4 MG tablet TAKE 1 TABLET (4 MG TOTAL) BY MOUTH 2 (TWO) TIMES DAILY AS NEEDED FOR MUSCLE SPASMS. 60 tablet 1  . traZODone (DESYREL) 100 MG tablet TAKE 1 TABLET (100 MG TOTAL) BY MOUTH AT BEDTIME AS NEEDED FOR SLEEP. 90 tablet 1  . DULoxetine (CYMBALTA) 60 MG capsule TAKE 1 CAPSULE BY MOUTH EVERY DAY 90 capsule 1  . fluconazole (DIFLUCAN) 150 MG tablet TAKE 2 TABLETS (300 MG TOTAL) BY MOUTH ONCE A WEEK. 4 tablet 1  . methylPREDNISolone (MEDROL) 4 MG tablet 5 tab po qd X d3 then 4 tab po qd X 3d then 3 tab po qd X 3d then 2 tab po qd x 3d then 1 tab po qd x 3 days then stop 45 tablet 1  .  rOPINIRole (REQUIP) 0.5 MG tablet Take 1-2 tablets (0.5-1 mg total) by mouth at bedtime. 180 tablet 1  . Vitamin D, Ergocalciferol, (DRISDOL) 1.25 MG (50000 UT) CAPS capsule Take 1 capsule (50,000 Units total) by mouth every 7 (seven) days. 8 capsule 0   No facility-administered  medications prior to visit.    Allergies  Allergen Reactions  . Adhesive [Tape]     blisters  . Penicillins     REACTION: rash  . Promethazine Hcl Other (See Comments)    Jerky movements    Review of Systems  Constitutional: Negative for fever and malaise/fatigue.  HENT: Negative for congestion.   Eyes: Negative for blurred vision.  Respiratory: Negative for shortness of breath.   Cardiovascular: Negative for chest pain, palpitations and leg swelling.  Gastrointestinal: Positive for abdominal pain and nausea. Negative for blood in stool and vomiting.  Genitourinary: Negative for dysuria and frequency.  Musculoskeletal: Positive for back pain, joint pain and myalgias. Negative for falls.  Skin: Negative for rash.  Neurological: Negative for dizziness, loss of consciousness and headaches.  Endo/Heme/Allergies: Negative for environmental allergies.  Psychiatric/Behavioral: Positive for depression. The patient is nervous/anxious.        Objective:    Physical Exam unable to obtain via phone visit  LMP 11/14/1978  Wt Readings from Last 3 Encounters:  11/21/18 256 lb 6.4 oz (116.3 kg)  08/28/18 250 lb 6.4 oz (113.6 kg)  05/31/18 226 lb 3.2 oz (102.6 kg)    Diabetic Foot Exam - Simple   No data filed     Lab Results  Component Value Date   WBC 9.7 11/21/2018   HGB 15.1 (H) 11/21/2018   HCT 46.0 11/21/2018   PLT 277.0 11/21/2018   GLUCOSE 100 (H) 11/21/2018   CHOL 261 (H) 05/31/2018   TRIG 117.0 05/31/2018   HDL 70.10 05/31/2018   LDLCALC 168 (H) 05/31/2018   ALT 19 11/21/2018   AST 15 11/21/2018   NA 143 11/21/2018   K 5.2 (H) 11/21/2018   CL 106 11/21/2018   CREATININE 0.95 11/21/2018     BUN 22 11/21/2018   CO2 29 11/21/2018   TSH 0.34 (L) 05/31/2018   HGBA1C 5.8 05/31/2018    Lab Results  Component Value Date   TSH 0.34 (L) 05/31/2018   Lab Results  Component Value Date   WBC 9.7 11/21/2018   HGB 15.1 (H) 11/21/2018   HCT 46.0 11/21/2018   MCV 84.9 11/21/2018   PLT 277.0 11/21/2018   Lab Results  Component Value Date   NA 143 11/21/2018   K 5.2 (H) 11/21/2018   CO2 29 11/21/2018   GLUCOSE 100 (H) 11/21/2018   BUN 22 11/21/2018   CREATININE 0.95 11/21/2018   BILITOT 0.8 11/21/2018   ALKPHOS 69 11/21/2018   AST 15 11/21/2018   ALT 19 11/21/2018   PROT 6.7 11/21/2018   ALBUMIN 4.1 11/21/2018   CALCIUM 9.7 11/21/2018   GFR 61.38 11/21/2018   Lab Results  Component Value Date   CHOL 261 (H) 05/31/2018   Lab Results  Component Value Date   HDL 70.10 05/31/2018   Lab Results  Component Value Date   LDLCALC 168 (H) 05/31/2018   Lab Results  Component Value Date   TRIG 117.0 05/31/2018   Lab Results  Component Value Date   CHOLHDL 4 05/31/2018   Lab Results  Component Value Date   HGBA1C 5.8 05/31/2018       Assessment & Plan:   Problem List Items Addressed This Visit    Hypothyroidism - Primary    On Levothyroxine, continue to monitor      Relevant Orders   TSH   Hyperlipidemia    Encouraged heart healthy diet, increase exercise, avoid trans fats, consider a krill oil cap daily  Relevant Orders   Lipid panel   Depression with anxiety    Refill given on Duloxeine which has been helpful      Relevant Medications   DULoxetine (CYMBALTA) 60 MG capsule   Osteoarthritis   Relevant Orders   Ambulatory referral to Orthopedic Surgery   Right hip pain    Encouraged moist heat and gentle stretching as tolerated. May try NSAIDs and prescription meds as directed and report if symptoms worsen or seek immediate care. Referred to orthopaedics for further consideration and treatment      Relevant Orders   Ambulatory referral to  Orthopedic Surgery   Abdominal pain    Check labs and avoid offending foods, report worsening symptoms      Relevant Orders   CBC with Differential/Platelet   Sedimentation rate   Amylase   Lipase   Ambulatory referral to Gastroenterology   Hiatal hernia with gastroesophageal reflux    Avoid offending foods, start probiotics. Do not eat large meals in late evening and consider raising head of bed. Referred to Dr Bryan Lemma of GI to consider procedure to help control symptoms      Relevant Orders   Ambulatory referral to Gastroenterology   Hyperglycemia    hgba1c acceptable, minimize simple carbs. Increase exercise as tolerated.       Relevant Orders   Hemoglobin A1c   Comprehensive metabolic panel   Knee pain, bilateral   Relevant Orders   Ambulatory referral to Orthopedic Surgery    Other Visit Diagnoses    Yeast infection       Relevant Medications   fluconazole (DIFLUCAN) 150 MG tablet      I have discontinued Neyra P. Kevorkian's methylPREDNISolone, Vitamin D (Ergocalciferol), and rOPINIRole. I am also having her start on tretinoin. Additionally, I am having her maintain her Premarin, ondansetron, tiZANidine, hydrOXYzine, traZODone, levothyroxine, celecoxib, clonazePAM, Melatonin, fluconazole, and DULoxetine.  Meds ordered this encounter  Medications  . fluconazole (DIFLUCAN) 150 MG tablet    Sig: Take 2 tablets (300 mg total) by mouth once a week.    Dispense:  4 tablet    Refill:  1    DX Code Needed  .  Marland Kitchen DULoxetine (CYMBALTA) 60 MG capsule    Sig: TAKE 1 CAPSULE BY MOUTH EVERY DAY    Dispense:  90 capsule    Refill:  1  . tretinoin (RETIN-A) 0.1 % cream    Sig: Apply topically at bedtime.    Dispense:  45 g    Refill:  1    I discussed the assessment and treatment plan with the patient. The patient was provided an opportunity to ask questions and all were answered. The patient agreed with the plan and demonstrated an understanding of the instructions.   The  patient was advised to call back or seek an in-person evaluation if the symptoms worsen or if the condition fails to improve as anticipated.  I provided 25 minutes of non-face-to-face time during this encounter.   Penni Homans, MD

## 2020-03-02 NOTE — Assessment & Plan Note (Signed)
On Levothyroxine, continue to monitor 

## 2020-03-02 NOTE — Assessment & Plan Note (Signed)
Refill given on Duloxeine which has been helpful

## 2020-03-02 NOTE — Assessment & Plan Note (Signed)
Encouraged moist heat and gentle stretching as tolerated. May try NSAIDs and prescription meds as directed and report if symptoms worsen or seek immediate care. Referred to orthopaedics for further consideration and treatment

## 2020-03-02 NOTE — Assessment & Plan Note (Signed)
Encouraged heart healthy diet, increase exercise, avoid trans fats, consider a krill oil cap daily 

## 2020-03-02 NOTE — Assessment & Plan Note (Signed)
hgba1c acceptable, minimize simple carbs. Increase exercise as tolerated.  

## 2020-03-02 NOTE — Assessment & Plan Note (Signed)
Check labs and avoid offending foods, report worsening symptoms

## 2020-03-04 ENCOUNTER — Telehealth: Payer: Self-pay | Admitting: *Deleted

## 2020-03-04 NOTE — Telephone Encounter (Signed)
Left message on machine for patient to call to schedule appointment.   Needs lab appointment next week and needs telephone visit in 6 weeks for follow up abdominal pain.

## 2020-03-11 ENCOUNTER — Other Ambulatory Visit (INDEPENDENT_AMBULATORY_CARE_PROVIDER_SITE_OTHER): Payer: Medicare HMO

## 2020-03-11 ENCOUNTER — Telehealth: Payer: Self-pay

## 2020-03-11 ENCOUNTER — Other Ambulatory Visit: Payer: Self-pay | Admitting: *Deleted

## 2020-03-11 ENCOUNTER — Other Ambulatory Visit: Payer: Self-pay

## 2020-03-11 DIAGNOSIS — R109 Unspecified abdominal pain: Secondary | ICD-10-CM

## 2020-03-11 DIAGNOSIS — R739 Hyperglycemia, unspecified: Secondary | ICD-10-CM

## 2020-03-11 DIAGNOSIS — E782 Mixed hyperlipidemia: Secondary | ICD-10-CM | POA: Diagnosis not present

## 2020-03-11 DIAGNOSIS — B379 Candidiasis, unspecified: Secondary | ICD-10-CM

## 2020-03-11 DIAGNOSIS — E039 Hypothyroidism, unspecified: Secondary | ICD-10-CM | POA: Diagnosis not present

## 2020-03-11 LAB — LIPID PANEL
Cholesterol: 216 mg/dL — ABNORMAL HIGH (ref 0–200)
HDL: 51.3 mg/dL (ref >39.00)
LDL Cholesterol: 143 mg/dL — ABNORMAL HIGH (ref 0–99)
NonHDL: 165.16
Total CHOL/HDL Ratio: 4
Triglycerides: 111 mg/dL (ref 0.0–149.0)
VLDL: 22.2 mg/dL (ref 0.0–40.0)

## 2020-03-11 LAB — COMPREHENSIVE METABOLIC PANEL
ALT: 19 U/L (ref 0–35)
AST: 16 U/L (ref 0–37)
Albumin: 4.1 g/dL (ref 3.5–5.2)
Alkaline Phosphatase: 75 U/L (ref 39–117)
BUN: 18 mg/dL (ref 6–23)
CO2: 28 mEq/L (ref 19–32)
Calcium: 9.2 mg/dL (ref 8.4–10.5)
Chloride: 105 mEq/L (ref 96–112)
Creatinine, Ser: 1.05 mg/dL (ref 0.40–1.20)
GFR: 51.26 mL/min — ABNORMAL LOW (ref >60.00)
Glucose, Bld: 80 mg/dL (ref 70–99)
Potassium: 4.6 mEq/L (ref 3.5–5.1)
Sodium: 142 mEq/L (ref 135–145)
Total Bilirubin: 0.7 mg/dL (ref 0.2–1.2)
Total Protein: 6.8 g/dL (ref 6.0–8.3)

## 2020-03-11 LAB — HEMOGLOBIN A1C: Hgb A1c MFr Bld: 5.5 % (ref 4.6–6.5)

## 2020-03-11 LAB — LIPASE: Lipase: 7 U/L — ABNORMAL LOW (ref 11.0–59.0)

## 2020-03-11 LAB — TSH: TSH: 0.14 u[IU]/mL — ABNORMAL LOW (ref 0.35–4.50)

## 2020-03-11 LAB — SEDIMENTATION RATE: Sed Rate: 11 mm/hr (ref 0–30)

## 2020-03-11 LAB — AMYLASE: Amylase: 28 U/L (ref 27–131)

## 2020-03-11 MED ORDER — TRETINOIN 0.1 % EX CREA: TOPICAL_CREAM | Freq: Every day | CUTANEOUS | 1 refills | Status: AC

## 2020-03-11 MED ORDER — FLUCONAZOLE 150 MG PO TABS: 300.0000 mg | ORAL_TABLET | ORAL | 1 refills | Status: DC

## 2020-03-11 NOTE — Telephone Encounter (Signed)
Patient came in and stated that she did not received the refill for retina or fluconazole.  Our system shows that we sent in and it was received.  I called pharmacy and they stated that they did not received.  Rxs resent to pharmacy.

## 2020-03-11 NOTE — Telephone Encounter (Signed)
Please let patient know her insurance will not cover. She will have to pay cash if she would like to get the medicine.

## 2020-03-11 NOTE — Telephone Encounter (Signed)
PA denied. Medicare only covers for usage of acne vulgaris.

## 2020-03-11 NOTE — Telephone Encounter (Signed)
PA initiated via Covermymeds; KEY: [id number]. Awaiting determination.

## 2020-03-11 NOTE — Telephone Encounter (Signed)
Patient notified and she will pick up from pharmacy

## 2020-03-12 ENCOUNTER — Other Ambulatory Visit: Payer: Self-pay | Admitting: Family Medicine

## 2020-03-12 LAB — CBC WITH DIFFERENTIAL/PLATELET
Basophils Absolute: 0.1 10*3/uL (ref 0.0–0.1)
Basophils Relative: 1.2 % (ref 0.0–3.0)
Eosinophils Absolute: 0.3 10*3/uL (ref 0.0–0.7)
Eosinophils Relative: 2.8 % (ref 0.0–5.0)
HCT: 44.9 % (ref 36.0–46.0)
Hemoglobin: 14.7 g/dL (ref 12.0–15.0)
Lymphocytes Relative: 28.1 % (ref 12.0–46.0)
Lymphs Abs: 3.2 10*3/uL (ref 0.7–4.0)
MCHC: 32.8 g/dL (ref 30.0–36.0)
MCV: 85 fl (ref 78.0–100.0)
Monocytes Absolute: 0.6 10*3/uL (ref 0.1–1.0)
Monocytes Relative: 5.3 % (ref 3.0–12.0)
Neutro Abs: 7.1 10*3/uL (ref 1.4–7.7)
Neutrophils Relative %: 62.6 % (ref 43.0–77.0)
Platelets: 246 10*3/uL (ref 150.0–400.0)
RBC: 5.28 Mil/uL — ABNORMAL HIGH (ref 3.87–5.11)
RDW: 13.3 % (ref 11.5–15.5)
WBC: 11.3 10*3/uL — ABNORMAL HIGH (ref 4.0–10.5)

## 2020-03-12 MED ORDER — LEVOTHYROXINE SODIUM 50 MCG PO TABS: 50.0000 ug | ORAL_TABLET | Freq: Every day | ORAL | 1 refills | Status: DC

## 2020-03-12 NOTE — Addendum Note (Signed)
Addended by: Jiles Prows on: 03/12/2020 03:56 PM   Modules accepted: Orders

## 2020-03-12 NOTE — Telephone Encounter (Signed)
Requesting: klonopin  Contract:n/a UDS: 05/24/2018 Last Visit:03/11/19 Next Visit:n/a Last Refill:02/03/20  Please Advise

## 2020-03-30 ENCOUNTER — Other Ambulatory Visit: Payer: Self-pay | Admitting: Family Medicine

## 2020-04-06 ENCOUNTER — Other Ambulatory Visit: Payer: Self-pay | Admitting: Family Medicine

## 2020-04-07 ENCOUNTER — Telehealth: Payer: Self-pay

## 2020-04-07 NOTE — Telephone Encounter (Signed)
PA approved. Effective 11/15/19 to 11/13/2020.  

## 2020-04-07 NOTE — Telephone Encounter (Signed)
PA initiated via Covermymeds; KEY: [id number]. Awaiting determination.

## 2020-04-08 ENCOUNTER — Encounter: Payer: Self-pay | Admitting: Family Medicine

## 2020-04-29 ENCOUNTER — Other Ambulatory Visit: Payer: Self-pay | Admitting: Medical

## 2020-05-08 DIAGNOSIS — J45909 Unspecified asthma, uncomplicated: Secondary | ICD-10-CM | POA: Diagnosis not present

## 2020-05-08 DIAGNOSIS — R0789 Other chest pain: Secondary | ICD-10-CM | POA: Diagnosis not present

## 2020-05-08 DIAGNOSIS — K449 Diaphragmatic hernia without obstruction or gangrene: Secondary | ICD-10-CM | POA: Diagnosis not present

## 2020-05-08 DIAGNOSIS — I517 Cardiomegaly: Secondary | ICD-10-CM | POA: Diagnosis not present

## 2020-05-08 DIAGNOSIS — R079 Chest pain, unspecified: Secondary | ICD-10-CM | POA: Diagnosis not present

## 2020-05-27 DIAGNOSIS — K449 Diaphragmatic hernia without obstruction or gangrene: Secondary | ICD-10-CM | POA: Diagnosis not present

## 2020-05-27 DIAGNOSIS — R109 Unspecified abdominal pain: Secondary | ICD-10-CM | POA: Diagnosis not present

## 2020-05-27 DIAGNOSIS — R079 Chest pain, unspecified: Secondary | ICD-10-CM | POA: Diagnosis not present

## 2020-05-28 ENCOUNTER — Telehealth: Payer: Self-pay | Admitting: Family Medicine

## 2020-05-28 ENCOUNTER — Other Ambulatory Visit: Payer: Self-pay | Admitting: Family Medicine

## 2020-05-28 MED ORDER — CLONAZEPAM 1 MG PO TABS: 0.5000 mg | ORAL_TABLET | Freq: Three times a day (TID) | ORAL | 1 refills | Status: DC | PRN

## 2020-05-28 NOTE — Telephone Encounter (Signed)
Last written: 04/07/20 Last ov:03/02/20 Next ov: none Contract: 05/24/18 UDS: 05/24/18

## 2020-05-28 NOTE — Telephone Encounter (Signed)
I will refill her Clonazepam but she will need a visit with UDS and contract in next 2 months. Please arrange

## 2020-05-28 NOTE — Telephone Encounter (Signed)
    Medication: clonazePAM (KLONOPIN) 1 MG tablet [[id number]]    Has the patient contacted their pharmacy? No. (If no, request that the patient contact the pharmacy for the refill.) (If yes, when and what did the pharmacy advise?)  Preferred Pharmacy (with phone number or street name): [facility] [address] [place], [place] [address]  [phone] Agent: Please be advised that RX refills may take up to 3 business days. We ask that you follow-up with your pharmacy.

## 2020-05-28 NOTE — Telephone Encounter (Signed)
Spoke with patient and she stated that Dr. Charlett Blake with continue to fill medications until she find new provider.  She lives 2 hours away in [place].  She is hoping to find a new PCP soon.

## 2020-05-31 ENCOUNTER — Other Ambulatory Visit: Payer: Self-pay | Admitting: Family Medicine

## 2020-06-01 ENCOUNTER — Other Ambulatory Visit: Payer: Self-pay | Admitting: Family Medicine

## 2020-06-01 ENCOUNTER — Telehealth: Payer: Self-pay | Admitting: Family Medicine

## 2020-06-01 MED ORDER — CLONAZEPAM 1 MG PO TABS: 0.5000 mg | ORAL_TABLET | Freq: Three times a day (TID) | ORAL | 1 refills | Status: DC | PRN

## 2020-06-01 NOTE — Telephone Encounter (Signed)
Call and cancel previous Clonazepam

## 2020-06-01 NOTE — Telephone Encounter (Signed)
Patient has moved to [place]. Her clonazePAM (KLONOPIN) 1 MG tablet was sent to her old Pharmacy. She requesting it be sent [facility] [place] on [address].  Her Pharm has been updated in her  Chart. Thanks

## 2020-06-01 NOTE — Telephone Encounter (Signed)
Rx cancelled and sent to correct pharmacy.  Patient notified.

## 2020-06-01 NOTE — Telephone Encounter (Signed)
Can you resend to new pharmacy?  [facility] [place]

## 2020-06-16 ENCOUNTER — Telehealth: Payer: Self-pay | Admitting: Family Medicine

## 2020-06-16 DIAGNOSIS — N761 Subacute and chronic vaginitis: Secondary | ICD-10-CM

## 2020-06-16 DIAGNOSIS — B379 Candidiasis, unspecified: Secondary | ICD-10-CM

## 2020-06-16 NOTE — Telephone Encounter (Signed)
Patient would like a 90 days supply  Medication: hydrOXYzine (ATARAX/VISTARIL) 25 MG tablet  traZODone (DESYREL) 100 MG tablet   tiZANidine (ZANAFLEX) 4 MG tablet   fluconazole (DIFLUCAN) 150 MG tablet   PREMARIN vaginal cream [[id number]]    Has the patient contacted their pharmacy?  (If no, request that the patient contact the pharmacy for the refill.) (If yes, when and what did the pharmacy advise?)     Preferred Pharmacy (with phone number or street name): [id number] [place] [place]  [address], [place] [address]  Phone:  [phone] Fax:  [phone]      Agent: Please be advised that RX refills may take up to 3 business days. We ask that you follow-up with your pharmacy.

## 2020-06-17 MED ORDER — PREMARIN 0.625 MG/GM VA CREA: TOPICAL_CREAM | VAGINAL | 3 refills | Status: AC

## 2020-06-17 MED ORDER — TRAZODONE HCL 100 MG PO TABS: 100.0000 mg | ORAL_TABLET | Freq: Every evening | ORAL | 1 refills | Status: DC | PRN

## 2020-06-17 MED ORDER — HYDROXYZINE HCL 25 MG PO TABS: 25.0000 mg | ORAL_TABLET | Freq: Every evening | ORAL | 3 refills | Status: DC | PRN

## 2020-06-17 MED ORDER — FLUCONAZOLE 150 MG PO TABS: 300.0000 mg | ORAL_TABLET | ORAL | 1 refills | Status: AC

## 2020-06-17 MED ORDER — TIZANIDINE HCL 4 MG PO TABS: 4.0000 mg | ORAL_TABLET | Freq: Two times a day (BID) | ORAL | 1 refills | Status: DC | PRN

## 2020-06-17 NOTE — Telephone Encounter (Signed)
Patient notified and rxs sent in,

## 2020-06-21 ENCOUNTER — Other Ambulatory Visit: Payer: Self-pay | Admitting: Medical

## 2020-07-02 ENCOUNTER — Other Ambulatory Visit: Payer: Self-pay | Admitting: Family Medicine

## 2020-07-02 DIAGNOSIS — K219 Gastro-esophageal reflux disease without esophagitis: Secondary | ICD-10-CM | POA: Diagnosis not present

## 2020-07-02 DIAGNOSIS — K449 Diaphragmatic hernia without obstruction or gangrene: Secondary | ICD-10-CM | POA: Diagnosis not present

## 2020-07-09 DIAGNOSIS — K449 Diaphragmatic hernia without obstruction or gangrene: Secondary | ICD-10-CM | POA: Diagnosis not present

## 2020-07-09 DIAGNOSIS — K429 Umbilical hernia without obstruction or gangrene: Secondary | ICD-10-CM | POA: Diagnosis not present

## 2020-07-09 DIAGNOSIS — R109 Unspecified abdominal pain: Secondary | ICD-10-CM | POA: Diagnosis not present

## 2020-07-09 DIAGNOSIS — N281 Cyst of kidney, acquired: Secondary | ICD-10-CM | POA: Diagnosis not present

## 2020-07-09 DIAGNOSIS — R079 Chest pain, unspecified: Secondary | ICD-10-CM | POA: Diagnosis not present

## 2020-07-09 DIAGNOSIS — R1013 Epigastric pain: Secondary | ICD-10-CM | POA: Diagnosis not present

## 2020-07-09 DIAGNOSIS — K439 Ventral hernia without obstruction or gangrene: Secondary | ICD-10-CM | POA: Diagnosis not present

## 2020-07-10 ENCOUNTER — Other Ambulatory Visit: Payer: Self-pay | Admitting: Family Medicine

## 2020-07-14 DIAGNOSIS — G8929 Other chronic pain: Secondary | ICD-10-CM | POA: Diagnosis not present

## 2020-07-14 DIAGNOSIS — M25551 Pain in right hip: Secondary | ICD-10-CM | POA: Diagnosis not present

## 2020-07-14 DIAGNOSIS — M25561 Pain in right knee: Secondary | ICD-10-CM | POA: Diagnosis not present

## 2020-07-14 DIAGNOSIS — M1611 Unilateral primary osteoarthritis, right hip: Secondary | ICD-10-CM | POA: Diagnosis not present

## 2020-07-15 DIAGNOSIS — K449 Diaphragmatic hernia without obstruction or gangrene: Secondary | ICD-10-CM | POA: Diagnosis not present

## 2020-07-18 ENCOUNTER — Other Ambulatory Visit: Payer: Self-pay | Admitting: Medical

## 2020-07-26 ENCOUNTER — Other Ambulatory Visit: Payer: Self-pay | Admitting: Family Medicine

## 2020-07-28 ENCOUNTER — Telehealth: Payer: Self-pay

## 2020-07-28 NOTE — Telephone Encounter (Signed)
Pt called on 07/27/20 to update PCP that she has been seeing a specialist for her stomach issues and she will need an operation as her stomach and colon are "pressing upwards."  She continues to have joint issues and needs surgery.  She has not found a new GP where she is living now.  She will be staying with her sister while her daughter takes care of her husband who is in the [employer].  Her surgeries will take a backseat to everything that is going on with her daughter and son-in-law.  She also stated she will need refills on klonopin in the future, but hates to bother you for those, but until she finds a new GP, she will be requesting refills on her meds.

## 2020-07-28 NOTE — Telephone Encounter (Signed)
I cannot remember where she moved to if it is within [place] I can refill her Klonopin for up to 3 months to allow her time to get settled with new PCP. If it is outside of [place] I cannot prescribe as I cannot practice medicine in states I am not licensed in please clarify

## 2020-07-29 NOTE — Telephone Encounter (Signed)
Spoke with patient and she did not need any refills at this time. She did make a phone visit in November to follow up until she finds new PCP.  She stated that she has called a few offices and it is a while before she can get an appt.  I explained and encouraged patient to go ahead and schedule an appointment to have something on the books.  She will do so.

## 2020-08-05 DIAGNOSIS — J029 Acute pharyngitis, unspecified: Secondary | ICD-10-CM | POA: Diagnosis not present

## 2020-08-05 DIAGNOSIS — B370 Candidal stomatitis: Secondary | ICD-10-CM | POA: Diagnosis not present

## 2020-08-13 ENCOUNTER — Other Ambulatory Visit: Payer: Self-pay | Admitting: Family Medicine

## 2020-08-14 ENCOUNTER — Telehealth: Payer: Self-pay | Admitting: Family Medicine

## 2020-08-14 ENCOUNTER — Other Ambulatory Visit: Payer: Self-pay | Admitting: Family Medicine

## 2020-08-14 NOTE — Telephone Encounter (Signed)
Requesting: clonazepam 1mg  Contract: 05/24/2018 UDS: 05/24/2018  Last Visit: 03/02/2020 Next Visit: None Last Refill: 06/01/2020 #90 and 1RF Pt sig: 1/2 to 1 tab tid prn  Pt has appt w/ new provider in November 2021. Will you refill until then?   Please Advise

## 2020-08-14 NOTE — Telephone Encounter (Signed)
Medication: clonazePAM (KLONOPIN) 1 MG tablet [[id number]]       Has the patient contacted their pharmacy?  (If no, request that the patient contact the pharmacy for the refill.) (If yes, when and what did the pharmacy advise?)     Preferred Pharmacy (with phone number or street name): [id number] [place], [place]  [address] [place] [address]  Phone:  [phone] Fax:  [phone]      Agent: Please be advised that RX refills may take up to 3 business days. We ask that you follow-up with your pharmacy.

## 2020-08-14 NOTE — Telephone Encounter (Signed)
Received already today, forwarded to provider.

## 2020-09-17 ENCOUNTER — Telehealth: Payer: Medicare HMO | Admitting: Family Medicine

## 2020-09-17 ENCOUNTER — Other Ambulatory Visit: Payer: Self-pay

## 2020-09-17 ENCOUNTER — Encounter: Payer: Self-pay | Admitting: Family Medicine

## 2020-09-17 DIAGNOSIS — N904 Leukoplakia of vulva: Secondary | ICD-10-CM

## 2020-09-18 ENCOUNTER — Other Ambulatory Visit: Payer: Self-pay | Admitting: Family Medicine

## 2020-09-18 ENCOUNTER — Telehealth (INDEPENDENT_AMBULATORY_CARE_PROVIDER_SITE_OTHER): Payer: Medicare HMO | Admitting: Family Medicine

## 2020-09-18 ENCOUNTER — Other Ambulatory Visit: Payer: Self-pay

## 2020-09-18 ENCOUNTER — Telehealth: Payer: Self-pay

## 2020-09-18 DIAGNOSIS — K219 Gastro-esophageal reflux disease without esophagitis: Secondary | ICD-10-CM

## 2020-09-18 DIAGNOSIS — N904 Leukoplakia of vulva: Secondary | ICD-10-CM

## 2020-09-18 DIAGNOSIS — H269 Unspecified cataract: Secondary | ICD-10-CM | POA: Diagnosis not present

## 2020-09-18 DIAGNOSIS — K449 Diaphragmatic hernia without obstruction or gangrene: Secondary | ICD-10-CM | POA: Diagnosis not present

## 2020-09-18 DIAGNOSIS — E039 Hypothyroidism, unspecified: Secondary | ICD-10-CM

## 2020-09-18 MED ORDER — CLOBETASOL PROPIONATE 0.05 % EX OINT: TOPICAL_OINTMENT | CUTANEOUS | 1 refills | Status: AC

## 2020-09-18 NOTE — Assessment & Plan Note (Signed)
She did not respond to candida treatments. She has moved to [place] to live with her daughter so she has agreed to find a PCP and GYN closer to home to have this further evaluated. Will try a course of Clobetasol Ointment to the vulva daily prn

## 2020-09-18 NOTE — Telephone Encounter (Signed)
Provider requested a phone call to pt to apologize for missed call yesterday evening. Was unable to make contact to reschedule visit. Pt encouraged to call office and schedule appt      phone went straight to vm. message left for returned call to rechedule appt

## 2020-09-18 NOTE — Assessment & Plan Note (Signed)
On Levothyroxine, continue to monitor 

## 2020-09-18 NOTE — Progress Notes (Signed)
Virtual Visit via Phone Note  I connected with Kayla [name] on 09/18/20 at 10:00 AM EDT by a phone enabled telemedicine application and verified that I am speaking with the correct person using two identifiers.  Location: Patient: home, patient and provider in visit Provider: home   I discussed the limitations of evaluation and management by telemedicine and the availability of in person appointments. The patient expressed understanding and agreed to proceed. S Chism, CMA was able to set the patient up on a phone visit after being unable to set up a video visit   Subjective:    Patient ID: Kayla [name], female    DOB: 03/07/1946, 74 y.o.   MRN: [id number]  No chief complaint on file.   HPI Patient is in today for follow up on chronic medical concerns. She has moved to [place] to live with her daughter and son in [place]. She has not been able to find a PCP there yet but is looking. She continues to have vaginal irritation and itching but no discharge, fevers, chills, abdominal or back pain. She is still struggling with hip pain and is considering a hip replacement. Her cataracts are worsening and is considering surgery for this as well. Denies CP/palp/SOB/HA/congestion/fevers/GI or GU c/o. Taking meds as prescribed  Past Medical History:  Diagnosis Date  . Allergy    allergic rhinitis  . Anemia    NOS  . Arthritis    osteoarthritis  . Asthma   . Degenerative disc disease, lumbar 12/13/2015  . Depression   . Depression with anxiety 01/19/2009   Qualifier: Diagnosis of  By: Redmond Pulling MD, Frann Rider    . Fibromyalgia   . GERD (gastroesophageal reflux disease)   . Hair loss 11/01/2016  . Headache(784.0)   . History of diverticulitis of colon   . Hx of colonic polyps   . Hyperglycemia 11/23/2014  . Hyperlipidemia   . Medicare annual wellness visit, subsequent 11/01/2016  . Migraine   . Mild cognitive impairment 04/29/2016  . Miscarriage   . Obesity, unspecified 10/26/2013  . Otitis,  externa, infective 11/23/2014  . Restless leg 08/29/2017  . Thyroid disease    hypothyroidism  . Urinary incontinence     Past Surgical History:  Procedure Laterality Date  . ABDOMINAL HYSTERECTOMY    . BLADDER SUSPENSION    . CHOLECYSTECTOMY    . TONSILLECTOMY      Family History  Problem Relation Age of Onset  . Alzheimer's disease Mother   . Alzheimer's disease Father   . Cancer Other        female, brain, lung  . Heart disease Other   . Stroke Other   . Diabetes Other   . Arthritis Other   . Hyperlipidemia Other   . Hypertension Other     Social History   Socioeconomic History  . Marital status: Divorced    Spouse name: Not on file  . Number of children: Not on file  . Years of education: Not on file  . Highest education level: Not on file  Occupational History  . Occupation: unemployed    Employer: RETIRED  Tobacco Use  . Smoking status: Never Smoker  . Smokeless tobacco: Never Used  Substance and Sexual Activity  . Alcohol use: No  . Drug use: No  . Sexual activity: Not on file  Other Topics Concern  . Not on file  Social History Narrative  . Not on file   Social Determinants of Health   Financial Resource  Strain:   . Difficulty of Paying Living Expenses: Not on file  Food Insecurity:   . Worried About Charity fundraiser in the Last Year: Not on file  . Ran Out of Food in the Last Year: Not on file  Transportation Needs:   . Lack of Transportation (Medical): Not on file  . Lack of Transportation (Non-Medical): Not on file  Physical Activity:   . Days of Exercise per Week: Not on file  . Minutes of Exercise per Session: Not on file  Stress:   . Feeling of Stress : Not on file  Social Connections:   . Frequency of Communication with Friends and Family: Not on file  . Frequency of Social Gatherings with Friends and Family: Not on file  . Attends Religious Services: Not on file  . Active Member of Clubs or Organizations: Not on file  . Attends  Archivist Meetings: Not on file  . Marital Status: Not on file  Intimate Partner Violence:   . Fear of Current or Ex-Partner: Not on file  . Emotionally Abused: Not on file  . Physically Abused: Not on file  . Sexually Abused: Not on file    Outpatient Medications Prior to Visit  Medication Sig Dispense Refill  . celecoxib (CELEBREX) 200 MG capsule TAKE 1 CAPSULE BY MOUTH TWICE A DAY 180 capsule 1  . clonazePAM (KLONOPIN) 1 MG tablet TAKE 0.5-1 TABLETS (0.5-1 MG TOTAL) BY MOUTH 3 (THREE) TIMES DAILY AS NEEDED FOR ANXIETY. 90 tablet 1  . conjugated estrogens (PREMARIN) vaginal cream PLACE 1 APPLICATORFUL VAGINALLY 3 (THREE) TIMES A WEEK. 30 g 3  . DULoxetine (CYMBALTA) 60 MG capsule TAKE 1 CAPSULE BY MOUTH EVERY DAY 90 capsule 1  . fluconazole (DIFLUCAN) 150 MG tablet Take 2 tablets (300 mg total) by mouth once a week. 4 tablet 1  . hydrOXYzine (ATARAX/VISTARIL) 25 MG tablet TAKE 1 TABLET (25 MG TOTAL) BY MOUTH AT BEDTIME AS NEEDED FOR ANXIETY. 90 tablet 2  . levothyroxine (SYNTHROID) 50 MCG tablet Take 1 tablet (50 mcg total) by mouth daily. 90 tablet 1  . Melatonin 10 MG TABS Take 1 tablet by mouth at bedtime as needed and may repeat dose one time if needed.    . ondansetron (ZOFRAN) 4 MG tablet Take 1 tablet (4 mg total) by mouth every 8 (eight) hours as needed for nausea. 30 tablet 0  . tiZANidine (ZANAFLEX) 4 MG tablet TAKE 1 TABLET (4 MG TOTAL) BY MOUTH 2 (TWO) TIMES DAILY AS NEEDED FOR MUSCLE SPASMS. 60 tablet 1  . traZODone (DESYREL) 100 MG tablet Take 1 tablet (100 mg total) by mouth at bedtime as needed for sleep. 90 tablet 1  . tretinoin (RETIN-A) 0.1 % cream Apply topically at bedtime. 45 g 1   No facility-administered medications prior to visit.    Allergies  Allergen Reactions  . Adhesive [Tape]     blisters  . Penicillins     REACTION: rash  . Promethazine Hcl Other (See Comments)    Jerky movements    Review of Systems  Constitutional: Negative for  fever and malaise/fatigue.  HENT: Negative for congestion.   Eyes: Positive for blurred vision. Negative for double vision and pain.  Respiratory: Negative for shortness of breath.   Cardiovascular: Negative for chest pain, palpitations and leg swelling.  Gastrointestinal: Negative for abdominal pain, blood in stool and nausea.  Genitourinary: Negative for dysuria and frequency.  Musculoskeletal: Positive for joint pain. Negative for falls.  Skin: Positive for itching. Negative for rash.  Neurological: Negative for dizziness, loss of consciousness and headaches.  Endo/Heme/Allergies: Negative for environmental allergies.  Psychiatric/Behavioral: Negative for depression. The patient is nervous/anxious.        Objective:    Physical Exam unable to obtain via phone visit  LMP 11/14/1978  Wt Readings from Last 3 Encounters:  11/21/18 256 lb 6.4 oz (116.3 kg)  08/28/18 250 lb 6.4 oz (113.6 kg)  05/31/18 226 lb 3.2 oz (102.6 kg)    Diabetic Foot Exam - Simple   No data filed     Lab Results  Component Value Date   WBC 11.3 (H) 03/11/2020   HGB 14.7 03/11/2020   HCT 44.9 03/11/2020   PLT 246.0 03/11/2020   GLUCOSE 80 03/11/2020   CHOL 216 (H) 03/11/2020   TRIG 111.0 03/11/2020   HDL 51.30 03/11/2020   LDLCALC 143 (H) 03/11/2020   ALT 19 03/11/2020   AST 16 03/11/2020   NA 142 03/11/2020   K 4.6 03/11/2020   CL 105 03/11/2020   CREATININE 1.05 03/11/2020   BUN 18 03/11/2020   CO2 28 03/11/2020   TSH 0.14 (L) 03/11/2020   HGBA1C 5.5 03/11/2020    Lab Results  Component Value Date   TSH 0.14 (L) 03/11/2020   Lab Results  Component Value Date   WBC 11.3 (H) 03/11/2020   HGB 14.7 03/11/2020   HCT 44.9 03/11/2020   MCV 85.0 03/11/2020   PLT 246.0 03/11/2020   Lab Results  Component Value Date   NA 142 03/11/2020   K 4.6 03/11/2020   CO2 28 03/11/2020   GLUCOSE 80 03/11/2020   BUN 18 03/11/2020   CREATININE 1.05 03/11/2020   BILITOT 0.7 03/11/2020   ALKPHOS  75 03/11/2020   AST 16 03/11/2020   ALT 19 03/11/2020   PROT 6.8 03/11/2020   ALBUMIN 4.1 03/11/2020   CALCIUM 9.2 03/11/2020   GFR 51.26 (L) 03/11/2020   Lab Results  Component Value Date   CHOL 216 (H) 03/11/2020   Lab Results  Component Value Date   HDL 51.30 03/11/2020   Lab Results  Component Value Date   LDLCALC 143 (H) 03/11/2020   Lab Results  Component Value Date   TRIG 111.0 03/11/2020   Lab Results  Component Value Date   CHOLHDL 4 03/11/2020   Lab Results  Component Value Date   HGBA1C 5.5 03/11/2020       Assessment & Plan:   Problem List Items Addressed This Visit    Hypothyroidism    On Levothyroxine, continue to monitor      Hiatal hernia with GERD    Avoid offending foods, start probiotics. Do not eat large meals in late evening and consider raising head of bed. She has an appointment with gastroenterology later this month to discuss surgical correction.      Lichen sclerosus et atrophicus of the vulva    She did not respond to candida treatments. She has moved to [place] to live with her daughter so she has agreed to find a PCP and GYN closer to home to have this further evaluated. Will try a course of Clobetasol Ointment to the vulva daily prn      Cataracts, bilateral    She is ready to have them surgically corrected.          I am having Onnika P. Ketchem maintain her ondansetron, celecoxib, Melatonin, DULoxetine, tretinoin, levothyroxine, fluconazole, Premarin, traZODone, hydrOXYzine, tiZANidine, and clonazePAM.  No orders of the defined types were placed in this encounter.    I discussed the assessment and treatment plan with the patient. The patient was provided an opportunity to ask questions and all were answered. The patient agreed with the plan and demonstrated an understanding of the instructions.   The patient was advised to call back or seek an in-person evaluation if the symptoms worsen or if the condition fails to  improve as anticipated.  I provided 25 minutes of non-face-to-face time during this encounter.   Penni Homans, MD

## 2020-09-18 NOTE — Assessment & Plan Note (Signed)
She is ready to have them surgically corrected.

## 2020-09-18 NOTE — Assessment & Plan Note (Signed)
Avoid offending foods, start probiotics. Do not eat large meals in late evening and consider raising head of bed. She has an appointment with gastroenterology later this month to discuss surgical correction.

## 2020-09-19 ENCOUNTER — Other Ambulatory Visit: Payer: Self-pay | Admitting: Medical

## 2020-09-19 NOTE — Progress Notes (Signed)
Patient not seen.

## 2020-09-25 ENCOUNTER — Telehealth: Payer: Medicare HMO | Admitting: Family Medicine

## 2020-09-26 ENCOUNTER — Other Ambulatory Visit: Payer: Self-pay | Admitting: Family Medicine

## 2020-10-07 DIAGNOSIS — Z01810 Encounter for preprocedural cardiovascular examination: Secondary | ICD-10-CM | POA: Diagnosis not present

## 2020-10-07 DIAGNOSIS — K449 Diaphragmatic hernia without obstruction or gangrene: Secondary | ICD-10-CM | POA: Diagnosis not present

## 2020-10-07 DIAGNOSIS — Z01818 Encounter for other preprocedural examination: Secondary | ICD-10-CM | POA: Diagnosis not present

## 2020-10-07 DIAGNOSIS — R918 Other nonspecific abnormal finding of lung field: Secondary | ICD-10-CM | POA: Diagnosis not present

## 2020-10-17 ENCOUNTER — Other Ambulatory Visit: Payer: Self-pay | Admitting: Family Medicine

## 2020-10-19 DIAGNOSIS — K449 Diaphragmatic hernia without obstruction or gangrene: Secondary | ICD-10-CM | POA: Diagnosis not present

## 2020-10-19 DIAGNOSIS — R12 Heartburn: Secondary | ICD-10-CM | POA: Diagnosis not present

## 2020-10-20 DIAGNOSIS — Z950 Presence of cardiac pacemaker: Secondary | ICD-10-CM | POA: Diagnosis not present

## 2020-10-20 DIAGNOSIS — M069 Rheumatoid arthritis, unspecified: Secondary | ICD-10-CM | POA: Diagnosis not present

## 2020-10-20 DIAGNOSIS — K449 Diaphragmatic hernia without obstruction or gangrene: Secondary | ICD-10-CM | POA: Diagnosis not present

## 2020-10-20 DIAGNOSIS — J939 Pneumothorax, unspecified: Secondary | ICD-10-CM | POA: Diagnosis not present

## 2020-10-20 DIAGNOSIS — J9811 Atelectasis: Secondary | ICD-10-CM | POA: Diagnosis not present

## 2020-10-20 DIAGNOSIS — I498 Other specified cardiac arrhythmias: Secondary | ICD-10-CM | POA: Diagnosis not present

## 2020-10-20 DIAGNOSIS — E039 Hypothyroidism, unspecified: Secondary | ICD-10-CM | POA: Diagnosis not present

## 2020-10-20 DIAGNOSIS — I517 Cardiomegaly: Secondary | ICD-10-CM | POA: Diagnosis not present

## 2020-10-20 DIAGNOSIS — I495 Sick sinus syndrome: Secondary | ICD-10-CM | POA: Diagnosis not present

## 2020-10-20 DIAGNOSIS — J95811 Postprocedural pneumothorax: Secondary | ICD-10-CM | POA: Diagnosis not present

## 2020-10-20 DIAGNOSIS — K5730 Diverticulosis of large intestine without perforation or abscess without bleeding: Secondary | ICD-10-CM | POA: Diagnosis not present

## 2020-10-20 DIAGNOSIS — G4733 Obstructive sleep apnea (adult) (pediatric): Secondary | ICD-10-CM | POA: Diagnosis not present

## 2020-10-20 DIAGNOSIS — R5381 Other malaise: Secondary | ICD-10-CM | POA: Diagnosis not present

## 2020-10-20 DIAGNOSIS — J90 Pleural effusion, not elsewhere classified: Secondary | ICD-10-CM | POA: Diagnosis not present

## 2020-10-20 DIAGNOSIS — I441 Atrioventricular block, second degree: Secondary | ICD-10-CM | POA: Diagnosis not present

## 2020-10-20 DIAGNOSIS — R9389 Abnormal findings on diagnostic imaging of other specified body structures: Secondary | ICD-10-CM | POA: Diagnosis not present

## 2020-10-20 DIAGNOSIS — I440 Atrioventricular block, first degree: Secondary | ICD-10-CM | POA: Diagnosis not present

## 2020-10-20 DIAGNOSIS — E785 Hyperlipidemia, unspecified: Secondary | ICD-10-CM | POA: Diagnosis not present

## 2020-10-20 DIAGNOSIS — R001 Bradycardia, unspecified: Secondary | ICD-10-CM | POA: Diagnosis not present

## 2020-10-21 DIAGNOSIS — I495 Sick sinus syndrome: Secondary | ICD-10-CM | POA: Diagnosis not present

## 2020-10-21 DIAGNOSIS — R001 Bradycardia, unspecified: Secondary | ICD-10-CM | POA: Diagnosis not present

## 2020-10-21 DIAGNOSIS — I498 Other specified cardiac arrhythmias: Secondary | ICD-10-CM | POA: Diagnosis not present

## 2020-10-21 DIAGNOSIS — J95811 Postprocedural pneumothorax: Secondary | ICD-10-CM | POA: Diagnosis not present

## 2020-10-21 DIAGNOSIS — I440 Atrioventricular block, first degree: Secondary | ICD-10-CM | POA: Diagnosis not present

## 2020-10-22 DIAGNOSIS — I440 Atrioventricular block, first degree: Secondary | ICD-10-CM | POA: Diagnosis not present

## 2020-10-22 DIAGNOSIS — R001 Bradycardia, unspecified: Secondary | ICD-10-CM | POA: Diagnosis not present

## 2020-10-22 DIAGNOSIS — J9811 Atelectasis: Secondary | ICD-10-CM | POA: Diagnosis not present

## 2020-10-22 DIAGNOSIS — J90 Pleural effusion, not elsewhere classified: Secondary | ICD-10-CM | POA: Diagnosis not present

## 2020-10-22 DIAGNOSIS — I498 Other specified cardiac arrhythmias: Secondary | ICD-10-CM | POA: Diagnosis not present

## 2020-10-22 DIAGNOSIS — J95811 Postprocedural pneumothorax: Secondary | ICD-10-CM | POA: Diagnosis not present

## 2020-10-22 DIAGNOSIS — I495 Sick sinus syndrome: Secondary | ICD-10-CM | POA: Diagnosis not present

## 2020-10-22 DIAGNOSIS — Z950 Presence of cardiac pacemaker: Secondary | ICD-10-CM | POA: Diagnosis not present

## 2020-10-23 DIAGNOSIS — R9389 Abnormal findings on diagnostic imaging of other specified body structures: Secondary | ICD-10-CM | POA: Diagnosis not present

## 2020-10-23 DIAGNOSIS — E039 Hypothyroidism, unspecified: Secondary | ICD-10-CM | POA: Diagnosis not present

## 2020-10-23 DIAGNOSIS — I440 Atrioventricular block, first degree: Secondary | ICD-10-CM | POA: Diagnosis not present

## 2020-10-23 DIAGNOSIS — R001 Bradycardia, unspecified: Secondary | ICD-10-CM | POA: Diagnosis not present

## 2020-10-23 DIAGNOSIS — J9811 Atelectasis: Secondary | ICD-10-CM | POA: Diagnosis not present

## 2020-10-23 DIAGNOSIS — I495 Sick sinus syndrome: Secondary | ICD-10-CM | POA: Diagnosis not present

## 2020-10-23 DIAGNOSIS — I517 Cardiomegaly: Secondary | ICD-10-CM | POA: Diagnosis not present

## 2020-10-23 DIAGNOSIS — I498 Other specified cardiac arrhythmias: Secondary | ICD-10-CM | POA: Diagnosis not present

## 2020-10-23 DIAGNOSIS — Z950 Presence of cardiac pacemaker: Secondary | ICD-10-CM | POA: Diagnosis not present

## 2020-10-24 DIAGNOSIS — E039 Hypothyroidism, unspecified: Secondary | ICD-10-CM | POA: Diagnosis not present

## 2020-10-25 DIAGNOSIS — G4733 Obstructive sleep apnea (adult) (pediatric): Secondary | ICD-10-CM | POA: Diagnosis not present

## 2020-10-27 ENCOUNTER — Other Ambulatory Visit: Payer: Self-pay | Admitting: Family Medicine

## 2020-11-03 DIAGNOSIS — Z45018 Encounter for adjustment and management of other part of cardiac pacemaker: Secondary | ICD-10-CM | POA: Diagnosis not present

## 2020-11-05 ENCOUNTER — Other Ambulatory Visit: Payer: Self-pay | Admitting: Family

## 2020-11-11 DIAGNOSIS — R109 Unspecified abdominal pain: Secondary | ICD-10-CM | POA: Diagnosis not present

## 2020-11-12 DIAGNOSIS — K8689 Other specified diseases of pancreas: Secondary | ICD-10-CM | POA: Diagnosis not present

## 2020-11-12 DIAGNOSIS — K449 Diaphragmatic hernia without obstruction or gangrene: Secondary | ICD-10-CM | POA: Diagnosis not present

## 2020-11-12 DIAGNOSIS — R109 Unspecified abdominal pain: Secondary | ICD-10-CM | POA: Diagnosis not present

## 2020-11-17 ENCOUNTER — Other Ambulatory Visit: Payer: Self-pay | Admitting: Family Medicine

## 2020-12-10 ENCOUNTER — Other Ambulatory Visit: Payer: Self-pay | Admitting: Family Medicine

## 2020-12-23 ENCOUNTER — Other Ambulatory Visit: Payer: Self-pay | Admitting: Family Medicine

## 2021-01-26 ENCOUNTER — Telehealth: Payer: Self-pay | Admitting: *Deleted

## 2021-01-26 NOTE — Telephone Encounter (Signed)
Key: [id number] - PA Case ID: [id number] - Rx #: [id number]  This approval authorizes your coverage from 11/14/2020 - 11/13/2021  Approval faxed to pharmacy.

## 2021-03-18 ENCOUNTER — Other Ambulatory Visit: Payer: Self-pay | Admitting: Family Medicine

## 2021-03-20 ENCOUNTER — Other Ambulatory Visit: Payer: Self-pay | Admitting: Family Medicine

## 2021-04-05 ENCOUNTER — Other Ambulatory Visit: Payer: Self-pay | Admitting: Family Medicine

## 2021-05-31 ENCOUNTER — Other Ambulatory Visit: Payer: Medicare HMO | Admitting: Family Medicine

## 2021-06-12 ENCOUNTER — Other Ambulatory Visit: Payer: Self-pay | Admitting: Family Medicine

## 2021-06-19 ENCOUNTER — Other Ambulatory Visit: Payer: Self-pay | Admitting: Family Medicine

## 2021-07-19 ENCOUNTER — Other Ambulatory Visit: Payer: Self-pay | Admitting: Family Medicine

## 2021-07-27 ENCOUNTER — Other Ambulatory Visit: Payer: Self-pay | Admitting: Family Medicine

## 2021-07-27 NOTE — Telephone Encounter (Signed)
Please advise on refills.  Pt was last seen 09/18/20 as a VV and no follow up appt.   Rx last given last month.

## 2021-08-11 ENCOUNTER — Other Ambulatory Visit: Payer: Self-pay | Admitting: Family Medicine

## 2021-08-23 ENCOUNTER — Other Ambulatory Visit: Payer: Self-pay | Admitting: Family Medicine

## 2021-10-01 ENCOUNTER — Other Ambulatory Visit: Payer: Self-pay | Admitting: Family Medicine

## 2021-10-31 ENCOUNTER — Other Ambulatory Visit: Payer: Self-pay | Admitting: Family Medicine

## 2021-12-28 ENCOUNTER — Other Ambulatory Visit: Payer: Self-pay | Admitting: Family Medicine

## 2022-03-04 ENCOUNTER — Other Ambulatory Visit: Payer: Self-pay | Admitting: Family Medicine

## 2023-07-07 ENCOUNTER — Telehealth: Payer: Self-pay | Admitting: Family Medicine

## 2023-07-07 NOTE — Telephone Encounter (Signed)
Patient past due for annual physical. Left voicemail to see if patient has established care somewhere else or would like to continue care with the provider.
